# Patient Record
Sex: Female | Born: 1975 | Race: White | Hispanic: No | Smoking: Never smoker
Health system: Southern US, Community
[De-identification: ages and names within clinical notes are randomized; demographics above are authoritative.]

## PROBLEM LIST (undated history)

## (undated) DIAGNOSIS — Z800 Family history of malignant neoplasm of digestive organs: Secondary | ICD-10-CM

## (undated) DIAGNOSIS — T8859XA Other complications of anesthesia, initial encounter: Secondary | ICD-10-CM

## (undated) DIAGNOSIS — Z87442 Personal history of urinary calculi: Secondary | ICD-10-CM

## (undated) DIAGNOSIS — E039 Hypothyroidism, unspecified: Secondary | ICD-10-CM

## (undated) DIAGNOSIS — N289 Disorder of kidney and ureter, unspecified: Secondary | ICD-10-CM

## (undated) DIAGNOSIS — D649 Anemia, unspecified: Secondary | ICD-10-CM

## (undated) HISTORY — PX: CHOLECYSTECTOMY: SHX55

## (undated) HISTORY — DX: Family history of malignant neoplasm of digestive organs: Z80.0

## (undated) HISTORY — PX: GALLBLADDER SURGERY: SHX652

---

## 2011-01-15 ENCOUNTER — Emergency Department: Payer: Self-pay | Admitting: Emergency Medicine

## 2011-01-21 ENCOUNTER — Ambulatory Visit: Payer: Self-pay | Admitting: Urology

## 2011-01-22 ENCOUNTER — Ambulatory Visit: Payer: Self-pay | Admitting: Urology

## 2011-07-06 ENCOUNTER — Ambulatory Visit: Payer: Self-pay | Admitting: Urology

## 2012-02-01 ENCOUNTER — Ambulatory Visit: Payer: Self-pay | Admitting: Urology

## 2014-03-02 ENCOUNTER — Encounter: Payer: Self-pay | Admitting: Podiatry

## 2014-03-02 ENCOUNTER — Ambulatory Visit: Payer: Self-pay | Admitting: Podiatry

## 2014-03-06 ENCOUNTER — Encounter: Payer: Self-pay | Admitting: Podiatry

## 2014-03-06 ENCOUNTER — Ambulatory Visit (INDEPENDENT_AMBULATORY_CARE_PROVIDER_SITE_OTHER): Payer: BC Managed Care – PPO | Admitting: Podiatry

## 2014-03-06 VITALS — BP 133/84 | HR 87 | Resp 16 | Ht 68.0 in | Wt 170.0 lb

## 2014-03-06 DIAGNOSIS — L600 Ingrowing nail: Secondary | ICD-10-CM

## 2014-03-06 NOTE — Progress Notes (Signed)
   Subjective:    Patient ID: Kari Acosta, female    DOB: 23-Oct-1976, 38 y.o.   MRN: [id number]  HPI Comments: N pain L left great toenail D 3 weeks O sudden C worse A shoes T pt tried to cut it out, soaks in epson salt      Review of Systems  All other systems reviewed and are negative.       Objective:   Physical Exam        Assessment & Plan:

## 2014-03-06 NOTE — Patient Instructions (Signed)

## 2014-03-07 NOTE — Progress Notes (Signed)
Subjective:     Patient ID: Kari Acosta, female   DOB: [birth year], 38 y.o.   MRN: [id number]  HPI patient points to the left big toe stating it has become painful and I cannot wear shoe gear comfortably. States it's been present for about 3 weeks and she's tried to soak it and trim it without relief of symptoms   Review of Systems  All other systems reviewed and are negative.       Objective:   Physical Exam  Nursing note and vitals reviewed. Constitutional: She is oriented to person, place, and time.  Cardiovascular: Intact distal pulses.   Musculoskeletal: Normal range of motion.  Neurological: She is oriented to person, place, and time.  Skin: Skin is warm.   neurovascular status intact with muscle strength adequate and range of motion within normal limits subtalar midtarsal joint. Digits are well perfused and arch height is noted to be normal upon evaluation and I noted left hallux medial border is incurvated and painful in the distal and proximal tip. There is no redness or drainage noted     Assessment:     Ingrown toenail of the left hallux nail with damage to the underlying nail plate    Plan:     H&P reviewed with patient and condition discussed. I have recommended removal of the corner and explained procedure. I infiltrated 60 mg Xylocaine Marcaine mixture remove the medial border exposed matrix and applied phenol 3 applications 30 seconds followed by alcohol lavaged and sterile dressing. Instructed on soaks and reappoint

## 2018-09-12 ENCOUNTER — Emergency Department: Payer: BLUE CROSS/BLUE SHIELD

## 2018-09-12 ENCOUNTER — Encounter: Payer: Self-pay | Admitting: Emergency Medicine

## 2018-09-12 ENCOUNTER — Other Ambulatory Visit: Payer: Self-pay

## 2018-09-12 ENCOUNTER — Emergency Department
Admission: EM | Admit: 2018-09-12 | Discharge: 2018-09-12 | Disposition: A | Discharge status: Home / Self Care | Payer: BLUE CROSS/BLUE SHIELD | Attending: Emergency Medicine | Admitting: Emergency Medicine

## 2018-09-12 DIAGNOSIS — R51 Headache: Secondary | ICD-10-CM | POA: Diagnosis not present

## 2018-09-12 DIAGNOSIS — R519 Headache, unspecified: Secondary | ICD-10-CM

## 2018-09-12 LAB — CBC WITH DIFFERENTIAL/PLATELET
BASOS ABS: 0 10*3/uL (ref 0–0.1)
Basophils Relative: 0 %
Eosinophils Absolute: 0 10*3/uL (ref 0–0.7)
Eosinophils Relative: 1 %
HCT: 31.1 % — ABNORMAL LOW (ref 35.0–47.0)
HEMOGLOBIN: 10.5 g/dL — AB (ref 12.0–16.0)
Lymphocytes Relative: 20 %
Lymphs Abs: 0.9 10*3/uL — ABNORMAL LOW (ref 1.0–3.6)
MCH: 28.6 pg (ref 26.0–34.0)
MCHC: 33.7 g/dL (ref 32.0–36.0)
MCV: 84.8 fL (ref 80.0–100.0)
Monocytes Absolute: 0.4 10*3/uL (ref 0.2–0.9)
Monocytes Relative: 10 %
NEUTROS PCT: 69 %
Neutro Abs: 3 10*3/uL (ref 1.4–6.5)
Platelets: 142 10*3/uL — ABNORMAL LOW (ref 150–440)
RBC: 3.67 MIL/uL — AB (ref 3.80–5.20)
RDW: 15.5 % — ABNORMAL HIGH (ref 11.5–14.5)
WBC: 4.4 10*3/uL (ref 3.6–11.0)

## 2018-09-12 LAB — SEDIMENTATION RATE: Sed Rate: 34 mm/hr — ABNORMAL HIGH (ref 0–20)

## 2018-09-12 LAB — BASIC METABOLIC PANEL
ANION GAP: 7 (ref 5–15)
BUN: 11 mg/dL (ref 6–20)
CHLORIDE: 103 mmol/L (ref 98–111)
CO2: 28 mmol/L (ref 22–32)
Calcium: 8.6 mg/dL — ABNORMAL LOW (ref 8.9–10.3)
Creatinine, Ser: 0.48 mg/dL (ref 0.44–1.00)
GFR calc non Af Amer: >60 mL/min (ref >60)
Glucose, Bld: 95 mg/dL (ref 70–99)
Potassium: 3.7 mmol/L (ref 3.5–5.1)
Sodium: 138 mmol/L (ref 135–145)

## 2018-09-12 LAB — POCT PREGNANCY, URINE: PREG TEST UR: NEGATIVE

## 2018-09-12 MED ORDER — SODIUM CHLORIDE 0.9 % IV BOLUS
1000.0000 mL | Freq: Once | INTRAVENOUS | Status: AC
  Administered 2018-09-12: 1000 mL via INTRAVENOUS

## 2018-09-12 MED ORDER — PREDNISONE 10 MG (21) PO TBPK: ORAL_TABLET | ORAL | 0 refills | Status: DC

## 2018-09-12 MED ORDER — PREDNISONE 20 MG PO TABS: 60.0000 mg | ORAL_TABLET | Freq: Every day | ORAL | 1 refills | Status: AC

## 2018-09-12 MED ORDER — PROCHLORPERAZINE MALEATE 10 MG PO TABS: 10.0000 mg | ORAL_TABLET | Freq: Three times a day (TID) | ORAL | 0 refills | Status: DC | PRN

## 2018-09-12 MED ORDER — PROCHLORPERAZINE EDISYLATE 10 MG/2ML IJ SOLN
10.0000 mg | Freq: Once | INTRAMUSCULAR | Status: AC
  Administered 2018-09-12: 10 mg via INTRAVENOUS

## 2018-09-12 NOTE — ED Provider Notes (Signed)
[facility] Emergency Department Provider Note   ____________________________________________   I have reviewed the triage vital signs and the nursing notes.   HISTORY  Chief Complaint Headache   History limited by: Not Limited   HPI Kari Acosta is a 42 y.o. female who presents to the emergency department today because of concerns for headache.  It started roughly 3 days ago.  Is located on the right side and behind her right eye.  She states it is been constant.  It is severe.  She is tried ibuprofen which will only temporarily give some relief.  She has had some associated blurry vision as well as nausea.  She denies any fevers although she feels hot.  The patient states that she has not had any trauma to her head.   Per medical record review patient has a history of gallbladder surgery  History reviewed. No pertinent past medical history.  There are no active problems to display for this patient.   Past Surgical History:  Procedure Laterality Date  . CESAREAN SECTION    . GALLBLADDER SURGERY      Prior to Admission medications   Not on File    Allergies Patient has no known allergies.  No family history on file.  Social History Social History   Tobacco Use  . Smoking status: Never Smoker  . Smokeless tobacco: Never Used  Substance Use Topics  . Alcohol use: No  . Drug use: Not on file    Review of Systems Constitutional: No fever/chills Eyes: Positive for blurry vision ENT: No sore throat. Cardiovascular: Denies chest pain. Respiratory: Denies shortness of breath. Gastrointestinal: No abdominal pain.  Positive for nausea.  Genitourinary: Negative for dysuria. Musculoskeletal: Negative for back pain. Skin: Negative for rash. Neurological: Positive for headache.  ____________________________________________   PHYSICAL EXAM:  VITAL SIGNS: ED Triage Vitals  Enc Vitals Group     BP 09/12/18 1519 138/88     Pulse Rate  09/12/18 1519 92     Resp 09/12/18 1519 18     Temp 09/12/18 1519 98.1 F (36.7 C)     Temp Source 09/12/18 1519 Oral     SpO2 09/12/18 1519 100 %     Weight 09/12/18 1521 165 lb (74.8 kg)     Height 09/12/18 1521 _0  (1.727 m)     Head Circumference --      Peak Flow --      Pain Score 09/12/18 1521 6   Constitutional: Alert and oriented.  Eyes: Conjunctivae are normal.  ENT      Head: Normocephalic and atraumatic.      Nose: No congestion/rhinnorhea.      Mouth/Throat: Mucous membranes are moist.      Neck: No stridor. Hematological/Lymphatic/Immunilogical: No cervical lymphadenopathy. Cardiovascular: Normal rate, regular rhythm.  No murmurs, rubs, or gallops.  Respiratory: Normal respiratory effort without tachypnea nor retractions. Breath sounds are clear and equal bilaterally. No wheezes/rales/rhonchi. Gastrointestinal: Soft and non tender. No rebound. No guarding.  Genitourinary: Deferred Musculoskeletal: Normal range of motion in all extremities. No lower extremity edema. Neurologic:  Normal speech and language. No gross focal neurologic deficits are appreciated.  Skin:  Skin is warm, dry and intact. No rash noted. Psychiatric: Mood and affect are normal. Speech and behavior are normal. Patient exhibits appropriate insight and judgment.  ____________________________________________    LABS (pertinent positives/negatives)  Upreg negative ESR 34 BMP wnl except ca 8.6 CBC wbc 4.4, hgb 10.5, plt 142 ____________________________________________  EKG  None  ____________________________________________    RADIOLOGY  CT head No acute abnormality  ____________________________________________   PROCEDURES  Procedures  ____________________________________________   INITIAL IMPRESSION / ASSESSMENT AND PLAN / ED COURSE  Pertinent labs & imaging results that were available during my care of the patient were reviewed by me and considered in my medical  decision making (see chart for details).    Presented to the emergency department today because of concerns for headache.  Primary areas of the right temple.  Patient denies ever having headaches like this before.  Given this is a new symptom for the patient broad work-up was initiated.  Head CT did not show any mass or bleed.  Patient's blood work without any elevation of the white blood cell count.  Sedimentation rate was elevated.  At this point given the mild elevation no other obvious etiology will plan on starting steroids.  Discussed with patient importance of ophthalmology follow-up.    ____________________________________________   FINAL CLINICAL IMPRESSION(S) / ED DIAGNOSES  Final diagnoses:  Bad headache     Note: This dictation was prepared with Dragon dictation. Any transcriptional errors that result from this process are unintentional     Nance Pear, MD 09/12/18 2011

## 2018-09-12 NOTE — ED Notes (Signed)
Pt states severe headache right behind her left eye. Pt states she had tried several different medications with no relief. Pt denies any headaches in the past

## 2018-09-12 NOTE — ED Notes (Signed)
Urine sent to lab with temp label on it.

## 2018-09-12 NOTE — Discharge Instructions (Signed)
Please seek medical attention for any high fevers, chest pain, shortness of breath, change in behavior, persistent vomiting, bloody stool or any other new or concerning symptoms.  

## 2018-09-12 NOTE — ED Notes (Signed)
Patient transported to CT 

## 2018-09-12 NOTE — ED Triage Notes (Signed)
Pt arrived via POV with reports of right sided headache around temple area since Wednesday off and on.  Pt states she has been taking ibuprofen and excedrin migraine. Pt states when she has headaches she gets nauseated and has light sensitivity.

## 2019-12-20 DIAGNOSIS — R519 Headache, unspecified: Secondary | ICD-10-CM | POA: Diagnosis not present

## 2019-12-20 DIAGNOSIS — J029 Acute pharyngitis, unspecified: Secondary | ICD-10-CM | POA: Diagnosis not present

## 2019-12-20 DIAGNOSIS — Z20828 Contact with and (suspected) exposure to other viral communicable diseases: Secondary | ICD-10-CM | POA: Diagnosis not present

## 2019-12-20 DIAGNOSIS — R509 Fever, unspecified: Secondary | ICD-10-CM | POA: Diagnosis not present

## 2019-12-21 DIAGNOSIS — R0982 Postnasal drip: Secondary | ICD-10-CM | POA: Diagnosis not present

## 2019-12-21 DIAGNOSIS — J019 Acute sinusitis, unspecified: Secondary | ICD-10-CM | POA: Diagnosis not present

## 2020-09-10 ENCOUNTER — Ambulatory Visit
Admission: RE | Admit: 2020-09-10 | Discharge: 2020-09-10 | Disposition: A | Discharge status: Home / Self Care | Payer: BC Managed Care – PPO | Source: Ambulatory Visit | Attending: Family Medicine | Admitting: Family Medicine

## 2020-09-10 ENCOUNTER — Other Ambulatory Visit: Payer: Self-pay

## 2020-09-10 VITALS — BP 127/84 | HR 107 | Temp 98.9°F | Resp 18

## 2020-09-10 DIAGNOSIS — M542 Cervicalgia: Secondary | ICD-10-CM

## 2020-09-10 DIAGNOSIS — M62838 Other muscle spasm: Secondary | ICD-10-CM

## 2020-09-10 DIAGNOSIS — M25511 Pain in right shoulder: Secondary | ICD-10-CM | POA: Diagnosis not present

## 2020-09-10 DIAGNOSIS — S161XXA Strain of muscle, fascia and tendon at neck level, initial encounter: Secondary | ICD-10-CM

## 2020-09-10 HISTORY — DX: Disorder of kidney and ureter, unspecified: N28.9

## 2020-09-10 MED ORDER — IBUPROFEN 800 MG PO TABS: 800.0000 mg | ORAL_TABLET | Freq: Three times a day (TID) | ORAL | 0 refills | Status: DC | PRN

## 2020-09-10 MED ORDER — KETOROLAC TROMETHAMINE 30 MG/ML IJ SOLN
30.0000 mg | Freq: Once | INTRAMUSCULAR | Status: AC
  Administered 2020-09-10: 30 mg via INTRAMUSCULAR

## 2020-09-10 MED ORDER — DEXAMETHASONE SODIUM PHOSPHATE 10 MG/ML IJ SOLN
10.0000 mg | Freq: Once | INTRAMUSCULAR | Status: AC
  Administered 2020-09-10: 10 mg via INTRAMUSCULAR

## 2020-09-10 MED ORDER — PREDNISONE 10 MG (21) PO TBPK: ORAL_TABLET | Freq: Every day | ORAL | 0 refills | Status: AC

## 2020-09-10 MED ORDER — CYCLOBENZAPRINE HCL 10 MG PO TABS: 10.0000 mg | ORAL_TABLET | Freq: Two times a day (BID) | ORAL | 0 refills | Status: DC | PRN

## 2020-09-10 NOTE — ED Triage Notes (Signed)
Pt starting having upper R back/neck pain 3 days ago.  Has progressed to upper back, neck and R arm pain.  Periods of numbness in RUE to fingers.  No heavy lifting or known injury.  Pt tried to work yesterday with increased pain.  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    Pain and tightness stays around a 5 with episodes of spasming that increase it to 9.    Ibuprofen, heating pad and biofreeze patches tried at home.  Heating pad at night has helped some.  Last ibuprofen 600mg  at 0900.

## 2020-09-10 NOTE — ED Provider Notes (Signed)
[facility]   [id number] 09/10/20 Arrival Time: 3810  FB:[facility]  SUBJECTIVE: History from: patient. Kari Acosta is a 44 y.o. female complains of right posterior neck and shoulder pain that began about 3 days ago. Has used ibuprofen with little relief. Reports that she has intermittent periods of numbness and tingling to right upper extremity and her fingers. Patient try to go to work yesterday and had increased pain. 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 Has taken ibuprofen this morning about 9:00. Has tried heat and Biofreeze patches at home. Symptoms are made worse with activity.  Denies similar symptoms in the past.  Denies fever, chills, erythema, ecchymosis, effusion, weakness, saddle paresthesias, loss of bowel or bladder function.      ROS: As per HPI.  All other pertinent ROS negative.     Past Medical History:  Diagnosis Date  . Renal disorder    kidney stones   Past Surgical History:  Procedure Laterality Date  . CESAREAN SECTION    . GALLBLADDER SURGERY     No Known Allergies No current facility-administered medications on file prior to encounter.   Current Outpatient Medications on File Prior to Encounter  Medication Sig Dispense Refill  . [DISCONTINUED] prochlorperazine (COMPAZINE) 10 MG tablet Take 1 tablet (10 mg total) by mouth every 8 (eight) hours as needed (headache). 30 tablet 0   Social History   Socioeconomic History  . Marital status: Single    Spouse name: Not on file  . Number of children: Not on file  . Years of education: Not on file  . Highest education level: Not on file  Occupational History  . Not on file  Tobacco Use  . Smoking status: Never Smoker  . Smokeless tobacco: Never Used  Vaping Use  . Vaping Use: Never used  Substance and Sexual Activity  . Alcohol use: No  . Drug use: Not on file  . Sexual activity: Not on file  Other Topics Concern  . Not on  file  Social History Narrative  . Not on file   Social Determinants of Health   Financial Resource Strain:   . Difficulty of Paying Living Expenses: Not on file  Food Insecurity:   . Worried About Charity fundraiser in the Last Year: Not on file  . Ran Out of Food in the Last Year: Not on file  Transportation Needs:   . Lack of Transportation (Medical): Not on file  . Lack of Transportation (Non-Medical): Not on file  Physical Activity:   . Days of Exercise per Week: Not on file  . Minutes of Exercise per Session: Not on file  Stress:   . Feeling of Stress : Not on file  Social Connections:   . Frequency of Communication with Friends and Family: Not on file  . Frequency of Social Gatherings with Friends and Family: Not on file  . Attends Religious Services: Not on file  . Active Member of Clubs or Organizations: Not on file  . Attends Archivist Meetings: Not on file  . Marital Status: Not on file  Intimate Partner Violence:   . Fear of Current or Ex-Partner: Not on file  . Emotionally Abused: Not on file  . Physically Abused: Not on file  . Sexually Abused: Not on file   No family history on file.  OBJECTIVE:  Vitals:   09/10/20 1348  BP: 127/84  Pulse: (!) 107  Resp: 18  Temp: 98.9 F (37.2 C)  TempSrc: Oral  SpO2: 98%    General appearance: ALERT; in no acute distress.  Head: NCAT Lungs: Normal respiratory effort CV:  pulses 2+ bilaterally. Cap refill < 2 seconds Musculoskeletal:  Inspection: Skin warm, dry, clear and intact without obvious erythema, effusion, or ecchymosis.  Palpation: Right paraspinal and right posterior cervical muscles tender to palpation and in spasm ROM: Limited ROM active and passive to right arm and neck Skin: warm and dry Neurologic: Ambulates without difficulty; Sensation intact about the upper/ lower extremities Psychological: alert and cooperative; normal mood and affect  DIAGNOSTIC STUDIES:  No results found.    ASSESSMENT & PLAN:  1. Acute strain of neck muscle, initial encounter   2. Neck pain   3. Acute pain of right shoulder   4. Cervical paraspinal muscle spasm     Meds ordered this encounter  Medications  . ketorolac (TORADOL) 30 MG/ML injection 30 mg  . dexamethasone (DECADRON) injection 10 mg  . predniSONE (STERAPRED UNI-PAK 21 TAB) 10 MG (21) TBPK tablet    Sig: Take by mouth daily for 6 days. Take 6 tablets on day 1, 5 tablets on day 2, 4 tablets on day 3, 3 tablets on day 4, 2 tablets on day 5, 1 tablet on day 6    Dispense:  21 tablet    Refill:  0    Order Specific Question:   Supervising Provider    Answer:   Chase Picket [id number]  . cyclobenzaprine (FLEXERIL) 10 MG tablet    Sig: Take 1 tablet (10 mg total) by mouth 2 (two) times daily as needed for muscle spasms.    Dispense:  20 tablet    Refill:  0    Order Specific Question:   Supervising Provider    Answer:   Chase Picket [id number]  . ibuprofen (ADVIL) 800 MG tablet    Sig: Take 1 tablet (800 mg total) by mouth every 8 (eight) hours as needed for moderate pain.    Dispense:  21 tablet    Refill:  0    Order Specific Question:   Supervising Provider    Answer:   Chase Picket [[id number]]   Toradol 30 mg IM given in office today Decadron 10 mg IM given in office today Prescribe steroid taper Prescribed cyclobenzaprine Prescribed ibuprofen cContinue conservative management of rest, ice, and gentle stretches Take ibuprofen as needed for pain relief (may cause abdominal discomfort, ulcers, and GI bleeds avoid taking with other NSAIDs) Take cyclobenzaprine at nighttime for symptomatic relief. Avoid driving or operating heavy machinery while using medication. Follow up with PCP if symptoms persist Return or go to the ER if you have any new or worsening symptoms (fever, chills, chest pain, abdominal pain, changes in bowel or bladder habits, pain radiating into lower legs)    Reviewed expectations re:  course of current medical issues. Questions answered. Outlined signs and symptoms indicating need for more acute intervention. Patient verbalized understanding. After Visit Summary given.       Faustino Congress, NP 09/10/20 1402

## 2020-09-10 NOTE — Discharge Instructions (Signed)
You have received an injection of Toradol for pain in the office today  You have also received an injection of steroids to help with inflammation around your neck  I have sent in ibuprofen 800 mg for you to take 1 tablet every 8 hours. You may take at 1000 mg of Tylenol with this as well.  I have sent in a prednisone taper for you to take for 6 days. 6 tablets on day one, 5 tablets on day two, 4 tablets on day three, 3 tablets on day four, 2 tablets on day five, and 1 tablet on day six.  I have sent in cyclobenzaprine for you to take twice a day as needed for muscle spasms. This medication can make you sleepy. Do not drive a car or operate machinery while taking this medication.  You may look up Mikki Santee and [place] on YouTube, they will have stretches for cervical muscle spasms, or cervical radiculopathy

## 2020-09-13 ENCOUNTER — Ambulatory Visit (INDEPENDENT_AMBULATORY_CARE_PROVIDER_SITE_OTHER): Payer: BC Managed Care – PPO

## 2020-09-13 ENCOUNTER — Ambulatory Visit
Admission: RE | Admit: 2020-09-13 | Discharge: 2020-09-13 | Disposition: A | Discharge status: Home / Self Care | Payer: BC Managed Care – PPO | Source: Ambulatory Visit | Attending: Emergency Medicine | Admitting: Emergency Medicine

## 2020-09-13 ENCOUNTER — Other Ambulatory Visit: Payer: Self-pay

## 2020-09-13 VITALS — BP 131/98 | HR 77 | Temp 98.1°F | Resp 18 | Ht 68.0 in | Wt 168.0 lb

## 2020-09-13 DIAGNOSIS — M25511 Pain in right shoulder: Secondary | ICD-10-CM | POA: Diagnosis not present

## 2020-09-13 DIAGNOSIS — M5030 Other cervical disc degeneration, unspecified cervical region: Secondary | ICD-10-CM | POA: Diagnosis not present

## 2020-09-13 DIAGNOSIS — M542 Cervicalgia: Secondary | ICD-10-CM

## 2020-09-13 DIAGNOSIS — M62838 Other muscle spasm: Secondary | ICD-10-CM | POA: Diagnosis not present

## 2020-09-13 MED ORDER — TIZANIDINE HCL 4 MG PO TABS: 4.0000 mg | ORAL_TABLET | Freq: Three times a day (TID) | ORAL | 0 refills | Status: DC | PRN

## 2020-09-13 MED ORDER — ACETAMINOPHEN 500 MG PO TABS
1000.0000 mg | ORAL_TABLET | Freq: Once | ORAL | Status: AC
  Administered 2020-09-13: 1000 mg via ORAL

## 2020-09-13 MED ORDER — LIDOCAINE 5 % EX PTCH: 1.0000 | MEDICATED_PATCH | CUTANEOUS | 0 refills | Status: DC

## 2020-09-13 MED ORDER — KETOROLAC TROMETHAMINE 60 MG/2ML IM SOLN
30.0000 mg | Freq: Once | INTRAMUSCULAR | Status: AC
  Administered 2020-09-13: 30 mg via INTRAMUSCULAR

## 2020-09-13 NOTE — ED Triage Notes (Signed)
Patient in today w/ right shoulder pain. Patient states NKI. Sx onset was approx 6 days ago.

## 2020-09-13 NOTE — ED Provider Notes (Signed)
HPI  SUBJECTIVE:  Kari Acosta is a left-handed 44 y.o. female who presents with 5 days of right upper shoulder pain.  She states it has gone into her neck starting yesterday.  She reports numbness down her arm underneath the axilla and along the medial arm to her ring and little fingers.  States the arm is constant sharp stabbing pain in the neck is a constant cramping-like pain.  She reports some grip weakness secondary to the pain.  No trauma to the neck or shoulder.  No fevers, chest pain, rash.  No sore throat.  She does a lot of repetitive activity at work, but no overhead repetitive activity.  Patient seen at the [facility] urgent care 2 days ago for the same, thought to have an acute neck muscle strain with spasm,.  She was given Decadron and Toradol there.  Sent home with prednisone, Flexeril, ibuprofen.  She states none of this is working.  She has also tried heat.  Symptoms are worse with sitting, lying, straightening her head up.  She has never had symptoms like this before.  Past medical history negative for diabetes, hypertension, cancer, osteoporosis, IV drug use, degenerative disc disease.  LMP: 3 days ago.  Denies the possibility being pregnant.  PMD: None.   Past Medical History:  Diagnosis Date  . Renal disorder    kidney stones    Past Surgical History:  Procedure Laterality Date  . CESAREAN SECTION    . GALLBLADDER SURGERY      History reviewed. No pertinent family history.  Social History   Tobacco Use  . Smoking status: Never Smoker  . Smokeless tobacco: Never Used  Vaping Use  . Vaping Use: Never used  Substance Use Topics  . Alcohol use: No  . Drug use: Not on file     Current Facility-Administered Medications:  .  acetaminophen (TYLENOL) tablet 1,000 mg, 1,000 mg, Oral, Once, Melynda Ripple, MD .  ketorolac (TORADOL) injection 30 mg, 30 mg, Intramuscular, Once, Melynda Ripple, MD  Current Outpatient Medications:  .  ibuprofen (ADVIL) 800 MG tablet,  Take 1 tablet (800 mg total) by mouth every 8 (eight) hours as needed for moderate pain., Disp: 21 tablet, Rfl: 0 .  predniSONE (STERAPRED UNI-PAK 21 TAB) 10 MG (21) TBPK tablet, Take by mouth daily for 6 days. Take 6 tablets on day 1, 5 tablets on day 2, 4 tablets on day 3, 3 tablets on day 4, 2 tablets on day 5, 1 tablet on day 6, Disp: 21 tablet, Rfl: 0 .  lidocaine (LIDODERM) 5 %, Place 1 patch onto the skin daily. Remove & Discard patch within 12 hours or as directed by MD, Disp: 10 patch, Rfl: 0 .  tiZANidine (ZANAFLEX) 4 MG tablet, Take 1 tablet (4 mg total) by mouth every 8 (eight) hours as needed for muscle spasms., Disp: 30 tablet, Rfl: 0  No Known Allergies   ROS  As noted in HPI.   Physical Exam  BP (!) 131/98 (BP Location: Left Arm)   Pulse 77   Temp 98.1 F (36.7 C) (Oral)   Resp 18   Ht 5\' 8"  (1.727 m)   Wt 76.2 kg   LMP 09/04/2020 (Exact Date)   SpO2 100%   BMI 25.54 kg/m   Constitutional: Well developed, well nourished, appears uncomfortable Eyes:  EOMI, conjunctiva normal bilaterally HENT: Normocephalic, atraumatic,mucus membranes moist Neck: No cervical adenopathy Respiratory: Normal inspiratory effort Cardiovascular: Normal rate GI: nondistended skin: No rash, skin intact  Musculoskeletal: Positive tenderness, muscle spasm right trapezius.  Complete shoulder exam normal.  Patient neurovascularly intact to light touch, temperature and motor intact in the median/radial/ulnar distribution.  No numbness over the deltoid.  Grip 5/5 and equal bilaterally.  RP 2+.  Pain aggravated with neck flexion and rotating her head to the right.  Empty can and liftoff test painful.  No C-spine tenderness. Neurologic: Alert & oriented x 3, no focal neuro deficits Psychiatric: Speech and behavior appropriate   ED Course   Medications  acetaminophen (TYLENOL) tablet 1,000 mg (has no administration in time range)  ketorolac (TORADOL) injection 30 mg (has no administration in  time range)    Orders Placed This Encounter  Procedures  . DG Cervical Spine Complete    Standing Status:   Standing    Number of Occurrences:   1    Order Specific Question:   Reason for Exam (SYMPTOM  OR DIAGNOSIS REQUIRED)    Answer:   r neck pain with radiculopathy    Order Specific Question:   Is patient pregnant?    Answer:   No    No results found for this or any previous visit (from the past 24 hour(s)). DG Cervical Spine Complete  Result Date: 09/13/2020 CLINICAL DATA:  RIGHT shoulder pain, RIGHT neck pain with radiculopathy for 6 weeks EXAM: CERVICAL SPINE - COMPLETE 4+ VIEW COMPARISON:  None FINDINGS: Slight reversal of cervical lordosis question muscle spasm. Osseous mineralization normal. Disc space narrowing C6-C7. Vertebral body and remaining disc space heights maintained. No fracture, subluxation, or bone destruction. Bony foramina patent. Prevertebral soft tissues normal thickness. Lung apices clear. IMPRESSION: Mild degenerative disc disease changes C6-C7. Question muscle spasm. Otherwise negative exam. Electronically Signed   By: Lavonia Dana M.D.   On: 09/13/2020 13:53    ED Clinical Impression  1. Trapezius muscle spasm   2. Degenerative disc disease, cervical      ED Assessment/Plan  Outside records reviewed.  As noted in HPI.  We will get C-spine imaging because this is a second visit and she is also describing some radicular pain to evaluate for arthritis or degenerative disc disease.  Doubt discitis, osteomyelitis or some infectious cause of her symptoms.  This seems to be very musculoskeletal.  Giving 30 mg of Toradol IM, 1000 milligrams of Tylenol p.o.  Reviewed imaging independently.  Mild degenerative disc changes/disc space narrowing C6/C7.  Question muscle spasm.  See radiology report for full details.  Patient with musculoskeletal neck/trapezius pain with spasm.  We will have her discontinue Flexeril, start Zanaflex.  Continue ibuprofen 800 mg  combined with 1000 mg of Tylenol 3 times a day.  Finish the prednisone, lidocaine patches, follow-up with [facility] in a week.  Discussed imaging, MDM, treatment plan, and plan for follow-up with patient. Discussed sn/sx that should prompt return to the ED. patient agrees with plan.   Meds ordered this encounter  Medications  . acetaminophen (TYLENOL) tablet 1,000 mg  . ketorolac (TORADOL) injection 30 mg  . tiZANidine (ZANAFLEX) 4 MG tablet    Sig: Take 1 tablet (4 mg total) by mouth every 8 (eight) hours as needed for muscle spasms.    Dispense:  30 tablet    Refill:  0  . lidocaine (LIDODERM) 5 %    Sig: Place 1 patch onto the skin daily. Remove & Discard patch within 12 hours or as directed by MD    Dispense:  10 patch    Refill:  0    *  This clinic note was created using [employer]. Therefore, there may be occasional mistakes despite careful proofreading.   ?    Melynda Ripple, MD 09/13/20 [phone]

## 2020-09-13 NOTE — Discharge Instructions (Addendum)
You have very mild degenerative disc disease in C6/C7.  I believe this was the cause of the numbness in your arm.  Otherwise, your x-rays are normal.  Stop the Flexeril.  Try the Zanaflex instead. Continue ibuprofen 800 mg combined with 1000 mg of Tylenol 3 times a day.  Finish the prednisone, lidocaine patches, follow-up with [facility] in a week.

## 2020-09-17 DIAGNOSIS — M5412 Radiculopathy, cervical region: Secondary | ICD-10-CM | POA: Diagnosis not present

## 2020-09-28 DIAGNOSIS — M5412 Radiculopathy, cervical region: Secondary | ICD-10-CM | POA: Diagnosis not present

## 2020-10-01 DIAGNOSIS — M542 Cervicalgia: Secondary | ICD-10-CM | POA: Diagnosis not present

## 2020-10-01 DIAGNOSIS — M5412 Radiculopathy, cervical region: Secondary | ICD-10-CM | POA: Diagnosis not present

## 2020-10-02 DIAGNOSIS — M5412 Radiculopathy, cervical region: Secondary | ICD-10-CM | POA: Diagnosis not present

## 2020-10-09 DIAGNOSIS — M5412 Radiculopathy, cervical region: Secondary | ICD-10-CM | POA: Diagnosis not present

## 2020-12-23 DIAGNOSIS — Z20822 Contact with and (suspected) exposure to covid-19: Secondary | ICD-10-CM | POA: Diagnosis not present

## 2020-12-23 DIAGNOSIS — Z03818 Encounter for observation for suspected exposure to other biological agents ruled out: Secondary | ICD-10-CM | POA: Diagnosis not present

## 2020-12-26 DIAGNOSIS — J0100 Acute maxillary sinusitis, unspecified: Secondary | ICD-10-CM | POA: Diagnosis not present

## 2020-12-26 DIAGNOSIS — R0982 Postnasal drip: Secondary | ICD-10-CM | POA: Diagnosis not present

## 2021-06-19 ENCOUNTER — Ambulatory Visit: Payer: BC Managed Care – PPO | Admitting: Nurse Practitioner

## 2021-06-19 ENCOUNTER — Encounter: Payer: Self-pay | Admitting: Nurse Practitioner

## 2021-06-19 ENCOUNTER — Other Ambulatory Visit: Payer: Self-pay

## 2021-06-19 VITALS — BP 130/91 | HR 82 | Temp 99.2°F | Ht 68.0 in | Wt 167.2 lb

## 2021-06-19 DIAGNOSIS — K5900 Constipation, unspecified: Secondary | ICD-10-CM | POA: Insufficient documentation

## 2021-06-19 DIAGNOSIS — Z13220 Encounter for screening for lipoid disorders: Secondary | ICD-10-CM | POA: Diagnosis not present

## 2021-06-19 DIAGNOSIS — Z1231 Encounter for screening mammogram for malignant neoplasm of breast: Secondary | ICD-10-CM

## 2021-06-19 DIAGNOSIS — Z7689 Persons encountering health services in other specified circumstances: Secondary | ICD-10-CM

## 2021-06-19 DIAGNOSIS — Z0000 Encounter for general adult medical examination without abnormal findings: Secondary | ICD-10-CM | POA: Diagnosis not present

## 2021-06-19 DIAGNOSIS — Z23 Encounter for immunization: Secondary | ICD-10-CM

## 2021-06-19 DIAGNOSIS — Z1211 Encounter for screening for malignant neoplasm of colon: Secondary | ICD-10-CM

## 2021-06-19 DIAGNOSIS — Z800 Family history of malignant neoplasm of digestive organs: Secondary | ICD-10-CM | POA: Insufficient documentation

## 2021-06-19 DIAGNOSIS — E039 Hypothyroidism, unspecified: Secondary | ICD-10-CM | POA: Diagnosis not present

## 2021-06-19 DIAGNOSIS — Z1159 Encounter for screening for other viral diseases: Secondary | ICD-10-CM | POA: Diagnosis not present

## 2021-06-19 DIAGNOSIS — Z114 Encounter for screening for human immunodeficiency virus [HIV]: Secondary | ICD-10-CM

## 2021-06-19 DIAGNOSIS — I83812 Varicose veins of left lower extremities with pain: Secondary | ICD-10-CM

## 2021-06-19 DIAGNOSIS — Z136 Encounter for screening for cardiovascular disorders: Secondary | ICD-10-CM

## 2021-06-19 NOTE — Assessment & Plan Note (Signed)
Significant varicose veins noted to left lower and upper leg. With intermittent pain will refer her to vascular for treatment options. She can wear compression socks.

## 2021-06-19 NOTE — Assessment & Plan Note (Signed)
States her mother has stage 3 colorectal cancer and that it is hereditary. Will place referral for screening colonoscopy.

## 2021-06-19 NOTE — Progress Notes (Signed)
New Patient Office Visit  Subjective:  Patient ID: Kari Acosta, female    DOB: [birth year]  Age: 45 y.o. MRN: [id number]  CC:  Chief Complaint  Patient presents with   Establish Care    Patient states she is here to establish care and discuss having a GI referral for a colonoscopy as her mother has stage 3 colon cancer and it is hereditary and her mother was diagnosed in 2016. Patient states she has issues with constipation. Patient would also like to discuss having a mammogram.     HPI Kari Acosta presents for new patient visit to establish care.  Introduced to Designer, jewellery role and practice setting.  All questions answered.  Discussed provider/patient relationship and expectations  CONSTIPATION  She has had trouble with constipation for years. She states that she does not have a bowel movement every day and that it takes 4 [employer] brand laxatives to have a bowel movement. She does this every other day. She cannot have a bowel movement now without taking laxatives. She denies blood in her stool. She does not take any other medications. She also states that she had "thyroid problems" in the past. She does not remember if it was underactive or overactive, however she was on medicine at one point.    Past Medical History:  Diagnosis Date   Renal disorder    kidney stones    Past Surgical History:  Procedure Laterality Date   CESAREAN SECTION     CHOLECYSTECTOMY     GALLBLADDER SURGERY      Family History  Problem Relation Age of Onset   Colon cancer Mother    Hypertension Mother    Hyperlipidemia Father    Hypertension Father    Stroke Father    Healthy Son    Healthy Son    Cancer Maternal Aunt    Breast cancer Maternal Grandmother    Gallbladder disease Maternal Grandmother        Gallbladder Cancer   Lung cancer Maternal Grandmother    Hypertension Maternal Grandfather    Heart failure Paternal Grandmother    Tuberculosis Paternal Grandfather     Cancer - Other Maternal Great-grandmother        Gallbladder Cancer    Social History   Socioeconomic History   Marital status: Single    Spouse name: Not on file   Number of children: Not on file   Years of education: Not on file   Highest education level: Not on file  Occupational History   Not on file  Tobacco Use   Smoking status: Never   Smokeless tobacco: Never  Vaping Use   Vaping Use: Never used  Substance and Sexual Activity   Alcohol use: No   Drug use: Never   Sexual activity: Yes  Other Topics Concern   Not on file  Social History Narrative   Not on file   Social Determinants of Health   Financial Resource Strain: Not on file  Food Insecurity: Not on file  Transportation Needs: Not on file  Physical Activity: Not on file  Stress: Not on file  Social Connections: Not on file  Intimate Partner Violence: Not on file    ROS Review of Systems  Constitutional:  Positive for fatigue. Negative for fever and unexpected weight change.  HENT: Negative.    Eyes: Negative.   Respiratory: Negative.    Cardiovascular: Negative.   Gastrointestinal:  Positive for constipation. Negative for abdominal pain, diarrhea, nausea and  vomiting.  Endocrine: Positive for cold intolerance. Negative for polydipsia, polyphagia and polyuria.  Genitourinary: Negative.   Musculoskeletal: Negative.   Skin: Negative.   Neurological: Negative.   Hematological: Negative.   Psychiatric/Behavioral: Negative.     Objective:   Today's Vitals: BP (!) 130/91   Pulse 82   Temp 99.2 F (37.3 C) (Oral)   Ht 5\' 8"  (1.727 m)   Wt 167 lb 3.2 oz (75.8 kg)   LMP 05/26/2021 (Approximate)   SpO2 99%   BMI 25.42 kg/m   Physical Exam Vitals and nursing note reviewed.  Constitutional:      General: She is not in acute distress.    Appearance: Normal appearance.  HENT:     Head: Normocephalic and atraumatic.     Right Ear: Tympanic membrane, ear canal and external ear normal.     Left  Ear: Tympanic membrane, ear canal and external ear normal.     Nose: Nose normal.     Mouth/Throat:     Mouth: Mucous membranes are moist.     Pharynx: Oropharynx is clear.  Eyes:     Conjunctiva/sclera: Conjunctivae normal.  Cardiovascular:     Rate and Rhythm: Normal rate and regular rhythm.     Pulses: Normal pulses.     Heart sounds: Normal heart sounds.  Pulmonary:     Effort: Pulmonary effort is normal.     Breath sounds: Normal breath sounds.  Abdominal:     General: Bowel sounds are normal.     Palpations: Abdomen is soft.     Tenderness: There is no abdominal tenderness.  Musculoskeletal:        General: Normal range of motion.     Cervical back: Normal range of motion.  Skin:    General: Skin is warm and dry.     Comments: Numerous varicose veins to left lower extremity  Neurological:     General: No focal deficit present.     Mental Status: She is alert and oriented to person, place, and time.     Cranial Nerves: No cranial nerve deficit.     Coordination: Coordination normal.     Gait: Gait normal.  Psychiatric:        Mood and Affect: Mood normal.        Behavior: Behavior normal.        Thought Content: Thought content normal.        Judgment: Judgment normal.    Assessment & Plan:   Problem List Items Addressed This Visit       Cardiovascular and Mediastinum   Varicose veins of left lower extremity with pain    Significant varicose veins noted to left lower and upper leg. With intermittent pain will refer her to vascular for treatment options. She can wear compression socks.        Relevant Orders   Ambulatory referral to Vascular Surgery     Other   Constipation    Chronic, used laxatives regularly and now can't have bowel movement without them. Will refer to GI along with family history of hereditary colon cancer.        Relevant Orders   Ambulatory referral to Gastroenterology   Family history of colon cancer in mother    States her mother  has stage 3 colorectal cancer and that it is hereditary. Will place referral for screening colonoscopy.        Relevant Orders   Ambulatory referral to Gastroenterology   Other Visit Diagnoses  Routine general medical examination at a health care facility    -  Primary   Health maintenance reviewed. She wants to come back for pap. Check cmp, cbc, tsh. family history of colon/rectal, and breast cancer. Will order colonscopy/mammo   Relevant Orders   Comprehensive metabolic panel   CBC with Differential/Platelet   Thyroid Panel With TSH   Screening for HIV (human immunodeficiency virus)       screen for HIV today   Relevant Orders   HIV Antibody (routine testing w rflx)   Encounter for hepatitis C screening test for low risk patient       screen for hep C today   Relevant Orders   Hepatitis C antibody   Encounter for lipid screening for cardiovascular disease       Lipid panel ordered today   Relevant Orders   Lipid Panel w/o Chol/HDL Ratio   Encounter for screening mammogram for malignant neoplasm of breast       With family history of breast cancer, will order baseline mammogram today.    Relevant Orders   MM 3D SCREEN BREAST BILATERAL   Screening for colon cancer       With colorectal cancer in her mother, her oncologist said it was hereditary and recommended early screening. Colonscopy ordered today   Relevant Orders   Ambulatory referral to Gastroenterology   Encounter to establish care       Need for Tdap vaccination       TdaP given today   Relevant Orders   Tdap vaccine greater than or equal to 7yo IM (Completed)       Outpatient Encounter Medications as of 06/19/2021  Medication Sig   ibuprofen (ADVIL) 800 MG tablet Take 1 tablet (800 mg total) by mouth every 8 (eight) hours as needed for moderate pain. (Patient not taking: Reported on 06/19/2021)   lidocaine (LIDODERM) 5 % Place 1 patch onto the skin daily. Remove & Discard patch within 12 hours or as directed by  MD (Patient not taking: Reported on 06/19/2021)   tiZANidine (ZANAFLEX) 4 MG tablet Take 1 tablet (4 mg total) by mouth every 8 (eight) hours as needed for muscle spasms. (Patient not taking: Reported on 06/19/2021)   [DISCONTINUED] prochlorperazine (COMPAZINE) 10 MG tablet Take 1 tablet (10 mg total) by mouth every 8 (eight) hours as needed (headache).   No facility-administered encounter medications on file as of 06/19/2021.    LABORATORY TESTING:  - Pap smear: offered today, will schedule in future  IMMUNIZATIONS:   - Tdap: Tetanus vaccination status reviewed: Td vaccination indicated and given today. - Influenza: Postponed to flu season - Pneumovax: Not applicable - Prevnar: Not applicable - HPV: Not applicable - Zostavax vaccine: Not applicable  SCREENING: -Mammogram: Ordered today  - Colonoscopy: Ordered today  - Bone Density: Not applicable  -Hearing Test: Not applicable  -Spirometry: Not applicable   PATIENT COUNSELING:   Advised to take 1 mg of folate supplement per day if capable of pregnancy.   Sexuality: Discussed sexually transmitted diseases, partner selection, use of condoms, avoidance of unintended pregnancy  and contraceptive alternatives.   Advised to avoid cigarette smoking.  I discussed with the patient that most people either abstain from alcohol or drink within safe limits (<=14/week and <=4 drinks/occasion for males, <=7/weeks and <= 3 drinks/occasion for females) and that the risk for alcohol disorders and other health effects rises proportionally with the number of drinks per week and how often a drinker exceeds daily  limits.  Discussed cessation/primary prevention of drug use and availability of treatment for abuse.   Diet: Encouraged to adjust caloric intake to maintain  or achieve ideal body weight, to reduce intake of dietary saturated fat and total fat, to limit sodium intake by avoiding high sodium foods and not adding table salt, and to maintain adequate  dietary potassium and calcium preferably from fresh fruits, vegetables, and low-fat dairy products.    stressed the importance of regular exercise  Injury prevention: Discussed safety belts, safety helmets, smoke detector, smoking near bedding or upholstery.   Dental health: Discussed importance of regular tooth brushing, flossing, and dental visits.   Follow-up: Return if symptoms worsen or fail to improve, for pap.   Charyl Dancer, NP

## 2021-06-19 NOTE — Assessment & Plan Note (Signed)
Chronic, used laxatives regularly and now can't have bowel movement without them. Will refer to GI along with family history of hereditary colon cancer.

## 2021-06-19 NOTE — Patient Instructions (Addendum)
It was great to meet you today! Your labs should result tomorrow and I will respond via mychart to the results. I have placed a referral to GI for colonoscopy and follow up on constipation, referral to vascular for varicose veins, and the order for mammogram. You can call this number to set up the mammogram:   [facility] [place]: [address], [place], [place] [address]  Phone: [phone]  You should hear from the GI office and Vascular office to set up appointments. Please let me know if you don't hear from them in the next 2 weeks. You can schedule an appointment for a Pap Smear when your schedule allows.   Again, it was great to meet you and thank you for allowing me to participate in your care.  Ander Purpura, NP

## 2021-06-20 LAB — CBC WITH DIFFERENTIAL/PLATELET
Basophils Absolute: 0.1 10*3/uL (ref 0.0–0.2)
Basos: 1 %
EOS (ABSOLUTE): 0.1 10*3/uL (ref 0.0–0.4)
Eos: 2 %
Hematocrit: 33.7 % — ABNORMAL LOW (ref 34.0–46.6)
Hemoglobin: 10.8 g/dL — ABNORMAL LOW (ref 11.1–15.9)
Immature Grans (Abs): 0 10*3/uL (ref 0.0–0.1)
Immature Granulocytes: 0 %
Lymphocytes Absolute: 1.6 10*3/uL (ref 0.7–3.1)
Lymphs: 32 %
MCH: 26.4 pg — ABNORMAL LOW (ref 26.6–33.0)
MCHC: 32 g/dL (ref 31.5–35.7)
MCV: 82 fL (ref 79–97)
Monocytes Absolute: 0.5 10*3/uL (ref 0.1–0.9)
Monocytes: 10 %
Neutrophils Absolute: 2.7 10*3/uL (ref 1.4–7.0)
Neutrophils: 55 %
Platelets: 162 10*3/uL (ref 150–450)
RBC: 4.09 x10E6/uL (ref 3.77–5.28)
RDW: 14 % (ref 11.7–15.4)
WBC: 5 10*3/uL (ref 3.4–10.8)

## 2021-06-20 LAB — COMPREHENSIVE METABOLIC PANEL
ALT: 11 IU/L (ref 0–32)
AST: 17 IU/L (ref 0–40)
Albumin/Globulin Ratio: 1.9 (ref 1.2–2.2)
Albumin: 4.6 g/dL (ref 3.8–4.8)
Alkaline Phosphatase: 40 IU/L — ABNORMAL LOW (ref 44–121)
BUN/Creatinine Ratio: 12 (ref 9–23)
BUN: 9 mg/dL (ref 6–24)
Bilirubin Total: 0.5 mg/dL (ref 0.0–1.2)
CO2: 23 mmol/L (ref 20–29)
Calcium: 9.3 mg/dL (ref 8.7–10.2)
Chloride: 101 mmol/L (ref 96–106)
Creatinine, Ser: 0.76 mg/dL (ref 0.57–1.00)
Globulin, Total: 2.4 g/dL (ref 1.5–4.5)
Glucose: 92 mg/dL (ref 65–99)
Potassium: 3.6 mmol/L (ref 3.5–5.2)
Sodium: 138 mmol/L (ref 134–144)
Total Protein: 7 g/dL (ref 6.0–8.5)
eGFR: 100 mL/min/{1.73_m2} (ref >59)

## 2021-06-20 LAB — THYROID PANEL WITH TSH
Free Thyroxine Index: 1.5 (ref 1.2–4.9)
T3 Uptake Ratio: 23 % — ABNORMAL LOW (ref 24–39)
T4, Total: 6.7 ug/dL (ref 4.5–12.0)
TSH: 9.94 u[IU]/mL — ABNORMAL HIGH (ref 0.450–4.500)

## 2021-06-20 LAB — HIV ANTIBODY (ROUTINE TESTING W REFLEX): HIV Screen 4th Generation wRfx: NONREACTIVE

## 2021-06-20 LAB — HEPATITIS C ANTIBODY: Hep C Virus Ab: 0.1 s/co ratio (ref 0.0–0.9)

## 2021-06-20 LAB — LIPID PANEL W/O CHOL/HDL RATIO
Cholesterol, Total: 160 mg/dL (ref 100–199)
HDL: 79 mg/dL (ref >39)
LDL Chol Calc (NIH): 71 mg/dL (ref 0–99)
Triglycerides: 48 mg/dL (ref 0–149)
VLDL Cholesterol Cal: 10 mg/dL (ref 5–40)

## 2021-06-20 MED ORDER — LEVOTHYROXINE SODIUM 25 MCG PO TABS: 25.0000 ug | ORAL_TABLET | Freq: Every day | ORAL | 1 refills | Status: DC

## 2021-06-20 NOTE — Progress Notes (Signed)
With TSH 9.9 and T4 in normal range will treat for subclinical hypothyroidism. Will start levothyroxine 73mcg daily. Recheck TSH and TPO antibodies in 6-8 weeks.

## 2021-06-20 NOTE — Addendum Note (Signed)
Addended by: Vance Peper A on: 06/20/2021 11:06 AM   Modules accepted: Orders

## 2021-07-04 ENCOUNTER — Other Ambulatory Visit: Payer: Self-pay

## 2021-07-04 ENCOUNTER — Ambulatory Visit
Admission: RE | Admit: 2021-07-04 | Discharge: 2021-07-04 | Disposition: A | Discharge status: Home / Self Care | Payer: BC Managed Care – PPO | Source: Ambulatory Visit | Attending: Nurse Practitioner | Admitting: Nurse Practitioner

## 2021-07-04 DIAGNOSIS — Z1231 Encounter for screening mammogram for malignant neoplasm of breast: Secondary | ICD-10-CM | POA: Insufficient documentation

## 2021-07-07 ENCOUNTER — Other Ambulatory Visit: Payer: Self-pay | Admitting: Nurse Practitioner

## 2021-07-07 DIAGNOSIS — R928 Other abnormal and inconclusive findings on diagnostic imaging of breast: Secondary | ICD-10-CM

## 2021-07-11 ENCOUNTER — Ambulatory Visit
Admission: RE | Admit: 2021-07-11 | Discharge: 2021-07-11 | Disposition: A | Discharge status: Home / Self Care | Payer: BC Managed Care – PPO | Source: Ambulatory Visit | Attending: Nurse Practitioner | Admitting: Nurse Practitioner

## 2021-07-11 ENCOUNTER — Other Ambulatory Visit: Payer: Self-pay

## 2021-07-11 DIAGNOSIS — R922 Inconclusive mammogram: Secondary | ICD-10-CM | POA: Diagnosis not present

## 2021-07-11 DIAGNOSIS — R928 Other abnormal and inconclusive findings on diagnostic imaging of breast: Secondary | ICD-10-CM

## 2021-07-18 ENCOUNTER — Other Ambulatory Visit (HOSPITAL_COMMUNITY)
Admission: RE | Admit: 2021-07-18 | Discharge: 2021-07-18 | Disposition: A | Discharge status: Home / Self Care | Payer: BC Managed Care – PPO | Source: Ambulatory Visit | Attending: Nurse Practitioner | Admitting: Nurse Practitioner

## 2021-07-18 ENCOUNTER — Ambulatory Visit: Payer: BC Managed Care – PPO | Admitting: Nurse Practitioner

## 2021-07-18 ENCOUNTER — Other Ambulatory Visit: Payer: Self-pay

## 2021-07-18 ENCOUNTER — Encounter: Payer: Self-pay | Admitting: Nurse Practitioner

## 2021-07-18 VITALS — BP 113/73 | HR 94 | Temp 98.6°F | Wt 166.6 lb

## 2021-07-18 DIAGNOSIS — E039 Hypothyroidism, unspecified: Secondary | ICD-10-CM | POA: Diagnosis not present

## 2021-07-18 DIAGNOSIS — Z124 Encounter for screening for malignant neoplasm of cervix: Secondary | ICD-10-CM | POA: Insufficient documentation

## 2021-07-18 DIAGNOSIS — N814 Uterovaginal prolapse, unspecified: Secondary | ICD-10-CM | POA: Diagnosis not present

## 2021-07-18 NOTE — Assessment & Plan Note (Signed)
Only took the levothyroxine for a few weeks. Encouraged her to keep taking it as she has had thyroid problems for a while. Will check TSH and TPO antibodies in 6-8 weeks. F/U if any concerns.

## 2021-07-18 NOTE — Progress Notes (Signed)
Established Patient Office Visit  Subjective:  Patient ID: Kari Acosta, female    DOB: [birth year]  Age: 45 y.o. MRN: [id number]  CC:  Chief Complaint  Patient presents with   Gynecologic Exam    Patient denies having any problems or concerns at today's visit.     HPI Kari Acosta presents for routine pap smear. She has been having feelings of pressure in her lower abdomen/pelvic area. She denies urinary frequency, urgency, and burning, just a pressure.   She had started to take her levothyroxine, however she noticed that it made her gain a few pounds and feel "off." She has not been taking it for the last few weeks.   Past Medical History:  Diagnosis Date   Renal disorder    kidney stones    Past Surgical History:  Procedure Laterality Date   CESAREAN SECTION     CHOLECYSTECTOMY     GALLBLADDER SURGERY      Family History  Problem Relation Age of Onset   Colon cancer Mother    Hypertension Mother    Hyperlipidemia Father    Hypertension Father    Stroke Father    Healthy Son    Healthy Son    Cancer Maternal Aunt    Breast cancer Maternal Grandmother    Gallbladder disease Maternal Grandmother        Gallbladder Cancer   Lung cancer Maternal Grandmother    Hypertension Maternal Grandfather    Heart failure Paternal Grandmother    Tuberculosis Paternal Grandfather    Cancer - Other Maternal Great-grandmother        Gallbladder Cancer    Social History   Socioeconomic History   Marital status: Single    Spouse name: Not on file   Number of children: Not on file   Years of education: Not on file   Highest education level: Not on file  Occupational History   Not on file  Tobacco Use   Smoking status: Never   Smokeless tobacco: Never  Vaping Use   Vaping Use: Never used  Substance and Sexual Activity   Alcohol use: No   Drug use: Never   Sexual activity: Yes  Other Topics Concern   Not on file  Social History Narrative   Not on file    Social Determinants of Health   Financial Resource Strain: Not on file  Food Insecurity: Not on file  Transportation Needs: Not on file  Physical Activity: Not on file  Stress: Not on file  Social Connections: Not on file  Intimate Partner Violence: Not on file    Outpatient Medications Prior to Visit  Medication Sig Dispense Refill   levothyroxine (SYNTHROID) 25 MCG tablet Take 1 tablet (25 mcg total) by mouth daily. 30 tablet 1   ibuprofen (ADVIL) 800 MG tablet Take 1 tablet (800 mg total) by mouth every 8 (eight) hours as needed for moderate pain. (Patient not taking: No sig reported) 21 tablet 0   lidocaine (LIDODERM) 5 % Place 1 patch onto the skin daily. Remove & Discard patch within 12 hours or as directed by MD (Patient not taking: No sig reported) 10 patch 0   tiZANidine (ZANAFLEX) 4 MG tablet Take 1 tablet (4 mg total) by mouth every 8 (eight) hours as needed for muscle spasms. (Patient not taking: No sig reported) 30 tablet 0   No facility-administered medications prior to visit.    No Known Allergies  ROS Review of Systems  Constitutional: Negative.  HENT: Negative.    Respiratory: Negative.    Cardiovascular: Negative.   Gastrointestinal: Negative.   Genitourinary:  Negative for dysuria, frequency, hematuria, urgency and vaginal discharge.       Pelvic pressure  Musculoskeletal: Negative.   Skin: Negative.   Neurological: Negative.      Objective:    Physical Exam Vitals and nursing note reviewed. Exam conducted with a chaperone present.  Constitutional:      General: She is not in acute distress.    Appearance: Normal appearance.  HENT:     Head: Normocephalic and atraumatic.  Eyes:     Conjunctiva/sclera: Conjunctivae normal.  Cardiovascular:     Rate and Rhythm: Normal rate and regular rhythm.     Pulses: Normal pulses.     Heart sounds: Normal heart sounds.  Pulmonary:     Effort: Pulmonary effort is normal.     Breath sounds: Normal breath  sounds.  Genitourinary:    General: Normal vulva.     Exam position: Lithotomy position.     Vagina: Prolapsed vaginal walls present.     Cervix: Normal.     Adnexa: Right adnexa normal and left adnexa normal.  Musculoskeletal:     Cervical back: Normal range of motion.  Skin:    General: Skin is warm and dry.  Neurological:     General: No focal deficit present.     Mental Status: She is alert and oriented to person, place, and time.  Psychiatric:        Mood and Affect: Mood normal.        Behavior: Behavior normal.        Thought Content: Thought content normal.        Judgment: Judgment normal.    BP 113/73   Pulse 94   Temp 98.6 F (37 C) (Oral)   Wt 166 lb 9.6 oz (75.6 kg)   LMP 06/25/2021   SpO2 98%   BMI 25.33 kg/m  Wt Readings from Last 3 Encounters:  07/18/21 166 lb 9.6 oz (75.6 kg)  06/19/21 167 lb 3.2 oz (75.8 kg)  09/13/20 168 lb (76.2 kg)     Health Maintenance Due  Topic Date Due   COVID-19 Vaccine (1) Never done   PAP SMEAR-Modifier  Never done   INFLUENZA VACCINE  07/14/2021    There are no preventive care reminders to display for this patient.  Lab Results  Component Value Date   TSH 9.940 (H) 06/19/2021   Lab Results  Component Value Date   WBC 5.0 06/19/2021   HGB 10.8 (L) 06/19/2021   HCT 33.7 (L) 06/19/2021   MCV 82 06/19/2021   PLT 162 06/19/2021   Lab Results  Component Value Date   NA 138 06/19/2021   K 3.6 06/19/2021   CO2 23 06/19/2021   GLUCOSE 92 06/19/2021   BUN 9 06/19/2021   CREATININE 0.76 06/19/2021   BILITOT 0.5 06/19/2021   ALKPHOS 40 (L) 06/19/2021   AST 17 06/19/2021   ALT 11 06/19/2021   PROT 7.0 06/19/2021   ALBUMIN 4.6 06/19/2021   CALCIUM 9.3 06/19/2021   ANIONGAP 7 09/12/2018   EGFR 100 06/19/2021   Lab Results  Component Value Date   CHOL 160 06/19/2021   Lab Results  Component Value Date   HDL 79 06/19/2021   Lab Results  Component Value Date   LDLCALC 71 06/19/2021   Lab Results   Component Value Date   TRIG 48 06/19/2021   No  results found for: CHOLHDL No results found for: HGBA1C    Assessment & Plan:   Problem List Items Addressed This Visit       Endocrine   Hypothyroidism    Only took the levothyroxine for a few weeks. Encouraged her to keep taking it as she has had thyroid problems for a while. Will check TSH and TPO antibodies in 6-8 weeks. F/U if any concerns.        Relevant Orders   Thyroid peroxidase antibody   TSH     Genitourinary   Uterine prolapse    Prolapse felt on exam, unable to determine uterine vs bladder. Will refer to GYN for further evaluation. Kegel exercises given.        Relevant Orders   Ambulatory referral to Gynecology   Other Visit Diagnoses     Screening for cervical cancer    -  Primary   Pap smear done today   Relevant Orders   Cytology - PAP       No orders of the defined types were placed in this encounter.   Follow-up: Return in about 1 year (around 07/18/2022) for 6-8 weeks for lab visit; 1 year for CPE.    Charyl Dancer, NP

## 2021-07-18 NOTE — Assessment & Plan Note (Signed)
Prolapse felt on exam, unable to determine uterine vs bladder. Will refer to GYN for further evaluation. Kegel exercises given.

## 2021-07-25 LAB — CYTOLOGY - PAP
Adequacy: ABSENT
Comment: NEGATIVE
Diagnosis: UNDETERMINED — AB
High risk HPV: NEGATIVE

## 2021-08-11 ENCOUNTER — Ambulatory Visit: Payer: BC Managed Care – PPO | Admitting: Obstetrics & Gynecology

## 2021-08-11 ENCOUNTER — Encounter: Payer: Self-pay | Admitting: Obstetrics & Gynecology

## 2021-08-11 ENCOUNTER — Other Ambulatory Visit: Payer: Self-pay

## 2021-08-11 VITALS — BP 120/80 | Ht 68.0 in | Wt 171.0 lb

## 2021-08-11 DIAGNOSIS — N814 Uterovaginal prolapse, unspecified: Secondary | ICD-10-CM | POA: Diagnosis not present

## 2021-08-11 NOTE — Patient Instructions (Signed)
Total Laparoscopic Hysterectomy A total laparoscopic hysterectomy is a minimally invasive surgery to remove the uterus and cervix. The fallopian tubes and ovaries can also be removed during this surgery, if necessary. This procedure may be done to treat problems such as: Growths in the uterus (uterine fibroids) that are not cancer but cause symptoms. A condition that causes the lining of the uterus to grow in other areas (endometriosis). Problems with pelvic support. Cancer of the cervix, ovaries, uterus, or tissue that lines the uterus (endometrium). Excessive bleeding in the uterus. After this procedure, you will no longer be able to have a baby, and you willno longer have a menstrual period. Tell a health care provider about: Any allergies you have. All medicines you are taking, including vitamins, herbs, eye drops, creams, and over-the-counter medicines. Any problems you or family members have had with anesthetic medicines. Any blood disorders you have. Any surgeries you have had. Any medical conditions you have. Whether you are pregnant or may be pregnant. What are the risks? Generally, this is a safe procedure. However, problems may occur, including: Infection. Bleeding. Blood clots in the legs or lungs. Allergic reactions to medicines. Damage to nearby structures or organs. Having to change from this surgery to one in which a large incision is made in the abdomen (abdominal hysterectomy). What happens before the procedure? Staying hydrated Follow instructions from your health care provider about hydration, which may include: Up to 2 hours before the procedure - you may continue to drink clear liquids, such as water, clear fruit juice, black coffee, and plain tea.  Eating and drinking restrictions Follow instructions from your health care provider about eating and drinking, which may include: 8 hours before the procedure - stop eating heavy meals or foods, such as meat, fried  foods, or fatty foods. 6 hours before the procedure - stop eating light meals or foods, such as toast or cereal. 6 hours before the procedure - stop drinking milk or drinks that contain milk. 2 hours before the procedure - stop drinking clear liquids. Medicines Ask your health care provider about: Changing or stopping your regular medicines. This is especially important if you are taking diabetes medicines or blood thinners. Taking medicines such as aspirin and ibuprofen. These medicines can thin your blood. Do not take these medicines unless your health care provider tells you to take them. Taking over-the-counter medicines, vitamins, herbs, and supplements. You may be asked to take medicine that helps you have a bowel movement (laxative) to prevent constipation. General instructions If you were asked to do bowel preparation before the procedure, follow instructions from your health care provider. This procedure can affect the way you feel about yourself. Talk with your health care provider about the physical and emotional changes hysterectomy may cause. Do not use any products that contain nicotine or tobacco for at least 4 weeks before the procedure. These products include cigarettes, chewing tobacco, and vaping devices, such as e-cigarettes. If you need help quitting, ask your health care provider. Plan to have a responsible adult take you home from the hospital or clinic. Plan to have a responsible adult care for you for the time you are told after you leave the hospital or clinic. This is important. Surgery safety Ask your health care provider: How your surgery site will be marked. What steps will be taken to help prevent infection. These may include: Removing hair at the surgery site. Washing skin with a germ-killing soap. Receiving antibiotic medicine. What happens during the procedure?   An IV will be inserted into one of your veins. You will be given one or more of the following: A  medicine to help you relax (sedative). A medicine to make you fall asleep (general anesthetic). A medicine to numb the area (local anesthetic). A medicine that is injected into your spine to numb the area below and slightly above the injection site (spinal anesthetic). A medicine that is injected into an area of your body to numb everything below the injection site (regional anesthetic). A gas will be used to inflate your abdomen. This will allow your surgeon to look inside your abdomen and do the surgery. Three or four small incisions will be made in your abdomen. A small device with a light (laparoscope) will be inserted into one of your incisions. Surgical instruments will be inserted through the other incisions in order to perform the procedure. Your uterus and cervix may be removed through your vagina or cut into small pieces and removed through the small incisions. Any other organs that need to be removed will also be removed this way. The gas will be released from inside your abdomen. Your incisions will be closed with stitches (sutures), skin glue, or adhesive strips. A bandage (dressing) may be placed over your incisions. The procedure may vary among health care providers and hospitals. What happens after the procedure? Your blood pressure, heart rate, breathing rate, and blood oxygen level will be monitored until you leave the hospital or clinic. You will be given medicine for pain as needed. You will be encouraged to walk as soon as possible. You will also use a device to help you breathe or do breathing exercises to keep your lungs clear. You may have to wear compression stockings. These stockings help to prevent blood clots and reduce swelling in your legs. You will need to wear a sanitary pad for vaginal discharge or bleeding. Summary Total laparoscopic hysterectomy is a procedure to remove your uterus, cervix, and sometimes the fallopian tubes and ovaries. This procedure can  affect the way you feel about yourself. Talk with your health care provider about the physical and emotional changes hysterectomy may cause. After this procedure, you will no longer be able to have a baby, and you will no longer have a menstrual period. You will be given pain medicine to control discomfort after this procedure. Plan to have a responsible adult take you home from the hospital or clinic. This information is not intended to replace advice given to you by your health care provider. Make sure you discuss any questions you have with your healthcare provider. Document Revised: 08/02/2020 Document Reviewed: 08/02/2020 Elsevier Patient Education  2022 [place].  Pelvic Organ Prolapse Pelvic organ prolapse is a condition in women that involves the stretching, bulging, or dropping of pelvic organs into an abnormal position, past the opening of the vagina. It happens when the muscles and tissues that surround and support pelvic structures become weak or stretched. Pelvic organ prolapse can involve the: Vagina (vaginal prolapse). Uterus (uterine prolapse). Bladder (cystocele). Rectum (rectocele). Intestines (enterocele). When organs other than the vagina are involved, they often bulge into thevagina or protrude from the vagina, depending on how severe the prolapse is. What are the causes? This condition may be caused by: Pregnancy, labor, and childbirth. Past pelvic surgery. Lower levels of the hormone estrogen due to menopause. Consistently lifting more than 50 lb (23 kg). Obesity. Long-term difficulty passing stool (chronic constipation). Long-term, or chronic, cough. Fluid buildup in the abdomen due  to certain conditions. What are the signs or symptoms? Symptoms of this condition include: Leaking a little urine (loss of bladder control) when you cough, sneeze, strain, and exercise (stress incontinence). This may be worse immediately after childbirth. It may gradually improve  over time. Feeling pressure in your pelvis or vagina. This pressure may increase when you cough or when you are passing stool. A bulge that protrudes from the opening of your vagina. Difficulty passing urine or stool. Pain in your lower back. Pain or discomfort during sex, or decreased interest in sex. Repeated bladder infections (urinary tract infections). Difficulty inserting a tampon. In some people, this condition causes no symptoms. How is this diagnosed? This condition may be diagnosed based on a vaginal and rectal exam. During the exam, you may be asked to cough and strain while you are lying down, sitting, and standing up. Your health care provider will determine if other tests arerequired, such as bladder function tests. How is this treated? Treatment for this condition may depend on your symptoms. Treatment may include: Lifestyle changes, such as drinking plenty of fluids and eating foods that are high in fiber. Emptying your bladder at scheduled times (bladder training therapy). This can help reduce or avoid urinary incontinence. Estrogen. This may help mild prolapse by increasing the strength and tone of pelvic floor muscles. Kegel exercises. These may help mild cases of prolapse by strengthening and tightening the muscles of the pelvic floor. A soft, flexible device that helps support the vaginal walls and keep pelvic organs in place (pessary). This is inserted into your vagina by your health care provider. Surgery. This is often the only form of treatment for severe prolapse. Follow these instructions at home: Eating and drinking Avoid drinking beverages that contain caffeine or alcohol. Increase your intake of high-fiber foods to decrease constipation and straining during bowel movements. Activity Lose weight if recommended by your health care provider. Avoid heavy lifting and straining with exercise and work. Do not hold your breath when you perform mild to moderate lifting  and exercise activities. Limit your activities as directed by your health care provider. Do Kegel exercises as directed by your health care provider. To do this: Squeeze your pelvic floor muscles tight. You should feel a tight lift in your rectal area and a tightness in your vaginal area. Keep your stomach, buttocks, and legs relaxed. Hold the muscles tight for up to 10 seconds. Then relax your muscles. Repeat this exercise 50 times a day, or as much as told by your health care provider. Continue to do this exercise for at least 4-6 weeks, or for as long as told by your health care provider. General instructions Take over-the-counter and prescription medicines only as told by your health care provider. Wear a sanitary pad or adult diapers if you have urinary incontinence. If you have a pessary, take care of it as told by your health care provider. Keep all follow-up visits. This is important. Contact a health care provider if you: Have symptoms that interfere with your daily activities or sex life. Need medicine to help with the discomfort. Notice bleeding from your vagina that is not related to your menstrual period. Have a fever. Have pain or bleeding when you urinate. Have bleeding when you pass stool. Pass urine when you have sex. Have chronic constipation. Have a pessary that falls out. Have a foul-smelling vaginal discharge. Have an unusual, low pain in your abdomen. Get help right away if you: Cannot pass urine. Summary Pelvic  organ prolapse is the stretching, bulging, or dropping of pelvic organs into an abnormal position. It happens when the muscles and tissues that surround and support pelvic structures become weak or stretched. When organs other than the vagina are involved, they often bulge into the vagina or protrude from it, depending on how severe the prolapse is. In most cases, this condition needs to be treated only if it produces symptoms. Treatment may include lifestyle  changes, estrogen, Kegel exercises, pessary insertion, or surgery. Avoid heavy lifting and straining with exercise and work. Do not hold your breath when you perform mild to moderate lifting and exercise activities. Limit your activities as directed by your health care provider. This information is not intended to replace advice given to you by your health care provider. Make sure you discuss any questions you have with your healthcare provider. Document Revised: 05/27/2020 Document Reviewed: 05/27/2020 Elsevier Patient Education  [place].

## 2021-08-11 NOTE — Progress Notes (Signed)
Consultant: Vance Peper, NP Reason- pelvic prolapse  Patient is a 45 yo G2P2 WF who complains of a symptoms related to pelvic organ prolapse that are significant in their discomfort and bother to the patient.  She has pressure to the point of even pain at times in the lower abdomen (midline) and vagina.  Reg cycles, no issues.  No pain w sex, but does feel the uterus or "something" is in the way.  She has low libido, with unclear reasons why.  Problem started a few months ago. Symptoms include: prolapse of tissue with straining, discomfort: moderate, and incomplete urinary emptying and urinary frequency.  Symptoms have gradually worsened.    She has h/o 2 prior cesarean sections.  Also, prior BTL.  Prior lap chole.  Denies incontinence.  PMHx: She  has a past medical history of Renal disorder. Also,  has a past surgical history that includes Cesarean section; Gallbladder surgery; and Cholecystectomy., family history includes Breast cancer in her maternal grandmother; Cancer in her maternal aunt; Cancer - Other in her maternal great-grandmother; Colon cancer in her mother; Gallbladder disease in her maternal grandmother; Healthy in her son and son; Heart failure in her paternal grandmother; Hyperlipidemia in her father; Hypertension in her father, maternal grandfather, and mother; Lung cancer in her maternal grandmother; Stroke in her father; Tuberculosis in her paternal grandfather.,  reports that she has never smoked. She has never used smokeless tobacco. She reports that she does not drink alcohol and does not use drugs.  She has a current medication list which includes the following prescription(s): ibuprofen, levothyroxine, lidocaine, tizanidine, and [DISCONTINUED] prochlorperazine. Also, has No Known Allergies.  Review of Systems  Constitutional:  Negative for chills, fever and malaise/fatigue.  HENT:  Negative for congestion, sinus pain and sore throat.   Eyes:  Negative for blurred vision  and pain.  Respiratory:  Negative for cough and wheezing.   Cardiovascular:  Negative for chest pain and leg swelling.  Gastrointestinal:  Negative for abdominal pain, constipation, diarrhea, heartburn, nausea and vomiting.  Genitourinary:  Positive for frequency. Negative for dysuria, hematuria and urgency.  Musculoskeletal:  Negative for back pain, joint pain, myalgias and neck pain.  Skin:  Negative for itching and rash.  Neurological:  Negative for dizziness, tremors and weakness.  Endo/Heme/Allergies:  Does not bruise/bleed easily.  Psychiatric/Behavioral:  Negative for depression. The patient is not nervous/anxious and does not have insomnia.    Objective: BP 120/80   Ht '5\' 8"'$  (1.727 m)   Wt 171 lb (77.6 kg)   LMP 07/27/2021   BMI 26.00 kg/m  Physical Exam Constitutional:      General: She is not in acute distress.    Appearance: She is well-developed.  Genitourinary:     Bladder and urethral meatus normal.     Right Labia: No rash or tenderness.    Left Labia: No tenderness or rash.    No vaginal erythema or bleeding.     No vaginal prolapse present.     Right Adnexa: not tender and no mass present.    Left Adnexa: not tender and no mass present.    No cervical motion tenderness, discharge, polyp or nabothian cyst.     Uterus is prolapsed.     Uterus is not enlarged.     No uterine mass detected.    Uterus exam comments: Gr 1-2 Anteverted.     Uterus is anteverted.     Bladder exam comments: No cystocele .  Pelvic exam was performed with patient in the lithotomy position.  HENT:     Head: Normocephalic and atraumatic.     Nose: Nose normal.  Abdominal:     General: There is no distension.     Palpations: Abdomen is soft.     Tenderness: There is no abdominal tenderness.  Musculoskeletal:        General: Normal range of motion.  Neurological:     Mental Status: She is alert and oriented to person, place, and time.     Cranial Nerves: No cranial nerve  deficit.  Skin:    General: Skin is warm and dry.  Psychiatric:        Attention and Perception: Attention normal.        Mood and Affect: Mood and affect normal.        Speech: Speech normal.        Behavior: Behavior normal.        Thought Content: Thought content normal.        Judgment: Judgment normal.    ASSESSMENT/PLAN:    Problem List Items Addressed This Visit     Genitourinary   Uterine prolapse - Primary   Options discussed for treatment of pelvic organ prolapse, such as pessary use, hysterectomy (different approaches based on exam), expectant management (no treatment, as is not a life threatening concern).  As there is min-no cystocele, additional procedures are not recommended.  She will consider the option of laparoscopic hysterectomy.  Discussed preservation of ovaries.  Info gv.  Recovery discussed.   As this was a consultation, I have sent information to NP [facility] pertaining to todays visit, to include in the discussions involving her care.  Barnett Applebaum, MD, [facility], [facility] 08/11/2021  2:56 PM

## 2021-08-14 ENCOUNTER — Other Ambulatory Visit: Payer: Self-pay

## 2021-08-14 ENCOUNTER — Ambulatory Visit: Payer: BC Managed Care – PPO | Admitting: Gastroenterology

## 2021-08-14 ENCOUNTER — Encounter: Payer: Self-pay | Admitting: Gastroenterology

## 2021-08-14 ENCOUNTER — Other Ambulatory Visit: Payer: Self-pay | Admitting: Gastroenterology

## 2021-08-14 VITALS — BP 142/94 | HR 84 | Temp 97.6°F | Ht 68.0 in | Wt 170.2 lb

## 2021-08-14 DIAGNOSIS — Z800 Family history of malignant neoplasm of digestive organs: Secondary | ICD-10-CM

## 2021-08-14 DIAGNOSIS — K5904 Chronic idiopathic constipation: Secondary | ICD-10-CM | POA: Diagnosis not present

## 2021-08-14 DIAGNOSIS — Z8041 Family history of malignant neoplasm of ovary: Secondary | ICD-10-CM

## 2021-08-14 HISTORY — DX: Family history of malignant neoplasm of ovary: Z80.41

## 2021-08-14 MED ORDER — NA SULFATE-K SULFATE-MG SULF 17.5-3.13-1.6 GM/177ML PO SOLN: 1.0000 | Freq: Once | ORAL | 0 refills | Status: AC

## 2021-08-14 NOTE — Progress Notes (Signed)
Gastroenterology Consultation  Referring Provider:     Charyl Dancer, NP Primary Care Physician:  Kari Dancer, NP Primary Gastroenterologist:  Kari Acosta     Reason for Consultation:     Constipation        HPI:   Kari Acosta is a 45 y.o. y/o female referred for consultation & management of constipation by Dr. Dwan Acosta, Kari Darter, NP.  This patient comes in today with a history of constipation.  The patient states that she has to take a over-the-counter laxative to move her bowels.  She also reports that the laxative causes her abdominal pain.  There is no report of any unexplained weight loss black stools or bloody stools.  The patient does report that her mother was diagnosed with rectal cancer at the age of 4.  She denies that this constipation is new and states that has been going on for a long time.  The patient is also being followed by OB/GYN for uterine prolapse.  The patient is supposed to have surgery for the uterine prolapse and is waiting for authorization for this. She reports that the constipation has been bothering her for many years and is not a new thing.  Past Medical History:  Diagnosis Date   Renal disorder    kidney stones    Past Surgical History:  Procedure Laterality Date   CESAREAN SECTION     CHOLECYSTECTOMY     GALLBLADDER SURGERY      Prior to Admission medications   Medication Sig Start Date End Date Taking? Authorizing Provider  levothyroxine (SYNTHROID) 25 MCG tablet Take 1 tablet (25 mcg total) by mouth daily. 06/20/21  Yes Acosta, Kari A, NP  prochlorperazine (COMPAZINE) 10 MG tablet Take 1 tablet (10 mg total) by mouth every 8 (eight) hours as needed (headache). 09/12/18 09/10/20  Kari Pear, MD    Family History  Problem Relation Age of Onset   Colon cancer Mother    Hypertension Mother    Hyperlipidemia Father    Hypertension Father    Stroke Father    Healthy Son    Healthy Son    Cancer Maternal Aunt    Breast cancer  Maternal Grandmother    Gallbladder disease Maternal Grandmother        Gallbladder Cancer   Lung cancer Maternal Grandmother    Hypertension Maternal Grandfather    Heart failure Paternal Grandmother    Tuberculosis Paternal Grandfather    Cancer - Other Maternal Great-grandmother        Gallbladder Cancer     Social History   Tobacco Use   Smoking status: Never   Smokeless tobacco: Never  Vaping Use   Vaping Use: Never used  Substance Use Topics   Alcohol use: No   Drug use: Never    Allergies as of 08/14/2021   (No Known Allergies)    Review of Systems:    All systems reviewed and negative except where noted in HPI.   Physical Exam:  BP (!) 142/94   Pulse 84   Temp 97.6 F (36.4 C) (Oral)   Ht '5\' 8"'$  (1.727 m)   Wt 170 lb 3.2 oz (77.2 kg)   LMP 07/27/2021   BMI 25.88 kg/m  Patient's last menstrual period was 07/27/2021. General:   Alert,  Well-developed, well-nourished, pleasant and cooperative in NAD Head:  Normocephalic and atraumatic. Eyes:  Sclera clear, no icterus.   Conjunctiva pink. Ears:  Normal auditory acuity. Neck:  Supple;  no masses or thyromegaly. Lungs:  Respirations even and unlabored.  Clear throughout to auscultation.   No wheezes, crackles, or rhonchi. No acute distress. Heart:  Regular rate and rhythm; no murmurs, clicks, rubs, or gallops. Abdomen:  Normal bowel sounds.  No bruits.  Soft, non-tender and non-distended without masses, hepatosplenomegaly or hernias noted.  No guarding or rebound tenderness.  Negative Carnett sign.   Rectal:  Deferred.  Pulses:  Normal pulses noted. Extremities:  No clubbing or edema.  No cyanosis. Neurologic:  Alert and oriented x3;  grossly normal neurologically. Skin:  Intact without significant lesions or rashes.  No jaundice. Lymph Nodes:  No significant cervical adenopathy. Psych:  Alert and cooperative. Normal mood and affect.  Imaging Studies: No results found.  Assessment and Plan:   Kari Acosta is a 45 y.o. y/o female who comes in today with a history of chronic constipation and a mother with rectal cancer.  The patient's mother was 29 when she was diagnosed with rectal cancer.  The patient has uterine prolapse and usually takes something over-the-counter to move her bowels.  The patient will be set up for colonoscopy due to her family history and her constipation.  She will also be given samples of Linzess at 845 mcg and 290 mcg to see if either 1 of these doses will help her move her bowels on a regular basis.  The patient has been told to take the medication 1/2-hour before she eats.  The patient has been explained the plan and agrees with it.  She will follow-up at the time of the colonoscopy.    Kari Lame, MD. Kari Acosta    Note: This dictation was prepared with Dragon dictation along with smaller phrase technology. Any transcriptional errors that result from this process are unintentional.

## 2021-08-14 NOTE — Addendum Note (Signed)
Addended by: Lurlean Nanny on: 08/14/2021 04:20 PM   Modules accepted: Orders, SmartSet

## 2021-08-19 ENCOUNTER — Other Ambulatory Visit (INDEPENDENT_AMBULATORY_CARE_PROVIDER_SITE_OTHER): Payer: Self-pay | Admitting: Nurse Practitioner

## 2021-08-19 ENCOUNTER — Telehealth: Payer: Self-pay

## 2021-08-19 DIAGNOSIS — I83812 Varicose veins of left lower extremities with pain: Secondary | ICD-10-CM

## 2021-08-19 NOTE — Telephone Encounter (Signed)
-----   Message from Gae Dry, MD sent at 08/11/2021  2:54 PM EDT ----- Regarding: Surgery Surgery Booking Request Patient Full Name:  Kari Acosta  MRN: [id number]  DOB: 1976/03/12  Surgeon: Hoyt Koch, MD  Requested Surgery Date and Time: Any Primary Diagnosis AND Code: Uterine prolapse Secondary Diagnosis and Code: N81.4 Surgical Procedure: TLH/BS RNFA Requested?: No L&D Notification: No Admission Status: same day surgery Length of Surgery: 50 min Special Case Needs: No H&P: Yes Phone Interview???:  Yes Interpreter: No Medical Clearance:  No Special Scheduling Instructions: No Any known health/anesthesia issues, diabetes, sleep apnea, latex allergy, defibrillator/pacemaker?: No Acuity: P3   (P1 highest, P2 delay may cause harm, P3 low, elective gyn, P4 lowest) Post op follow up visits: yes\

## 2021-08-19 NOTE — Telephone Encounter (Signed)
Called patient to schedule TLH/BS w [facility]  DOS 10/11  H&P 10/4 @ 3:10   Pre-admit phone call appointment to be requested - date and time will be included on H&P paper work. Also all appointments will be updated on pt MyChart. Explained that this appointment has a call window. Based on the time scheduled will indicate if the call will be received within a 4 hour window before 1:00 or after.  Advised that pt may also receive calls from the hospital pharmacy and pre-service center.  Confirmed pt has [employer] as Chartered certified accountant. No secondary insurance.

## 2021-08-21 ENCOUNTER — Ambulatory Visit (INDEPENDENT_AMBULATORY_CARE_PROVIDER_SITE_OTHER): Payer: BC Managed Care – PPO

## 2021-08-21 ENCOUNTER — Encounter (INDEPENDENT_AMBULATORY_CARE_PROVIDER_SITE_OTHER): Payer: Self-pay | Admitting: Vascular Surgery

## 2021-08-21 ENCOUNTER — Ambulatory Visit (INDEPENDENT_AMBULATORY_CARE_PROVIDER_SITE_OTHER): Payer: BC Managed Care – PPO | Admitting: Vascular Surgery

## 2021-08-21 ENCOUNTER — Other Ambulatory Visit: Payer: Self-pay

## 2021-08-21 VITALS — BP 131/88 | HR 76 | Resp 16 | Ht 67.0 in | Wt 171.4 lb

## 2021-08-21 DIAGNOSIS — I83812 Varicose veins of left lower extremities with pain: Secondary | ICD-10-CM

## 2021-08-21 NOTE — Telephone Encounter (Signed)
Pt called back with a question regarding DOS. She advised that she believes she will be on her next cycle. She wanted to ask if this would make a difference with her DOS. I advised that I would forward her question to [facility].

## 2021-08-24 ENCOUNTER — Encounter (INDEPENDENT_AMBULATORY_CARE_PROVIDER_SITE_OTHER): Payer: Self-pay | Admitting: Vascular Surgery

## 2021-08-24 NOTE — Telephone Encounter (Signed)
Let her know, it will not make a difference with the timing of surgery and her period

## 2021-08-24 NOTE — Progress Notes (Signed)
MRN : [id number]  Kari Acosta is a 45 y.o. (1976/11/30) female who presents with chief complaint of painful varicose veins.  History of Present Illness:   The patient is seen for evaluation of symptomatic varicose veins. The patient relates burning and stinging which worsened steadily throughout the course of the day, particularly with standing. The patient also notes an aching and throbbing pain over the varicosities, particularly with prolonged dependent positions. The symptoms are significantly improved with elevation.  The patient also notes that during hot weather the symptoms are greatly intensified. The patient states the pain from the varicose veins interferes with work, daily exercise, shopping and household maintenance. At this point, the symptoms are persistent and severe enough that they're having a negative impact on lifestyle and are interfering with daily activities.  There is no history of DVT, PE or superficial thrombophlebitis. There is no history of ulceration or hemorrhage. The patient denies a significant family history of varicose veins.  The patient has worn graduated compression in the past and did not find this helpful.  In fact her veins and her symptoms have progressed in spite of this therapy. At the present time the patient has been using over-the-counter analgesics. There is no history of prior surgical intervention or sclerotherapy.    Current Meds  Medication Sig   levothyroxine (SYNTHROID) 25 MCG tablet Take 1 tablet (25 mcg total) by mouth daily.    Past Medical History:  Diagnosis Date   Renal disorder    kidney stones    Past Surgical History:  Procedure Laterality Date   CESAREAN SECTION     CHOLECYSTECTOMY     GALLBLADDER SURGERY      Social History Social History   Tobacco Use   Smoking status: Never   Smokeless tobacco: Never  Vaping Use   Vaping Use: Never used  Substance Use Topics   Alcohol use: No   Drug use: Never     Family History Family History  Problem Relation Age of Onset   Colon cancer Mother    Hypertension Mother    Hyperlipidemia Father    Hypertension Father    Stroke Father    Healthy Son    Healthy Son    Cancer Maternal Aunt    Breast cancer Maternal Grandmother    Gallbladder disease Maternal Grandmother        Gallbladder Cancer   Lung cancer Maternal Grandmother    Hypertension Maternal Grandfather    Heart failure Paternal Grandmother    Tuberculosis Paternal Grandfather    Cancer - Other Maternal Great-grandmother        Gallbladder Cancer    No Known Allergies   REVIEW OF SYSTEMS (Negative unless checked)  Constitutional: '[]'$ Weight loss  '[]'$ Fever  '[]'$ Chills Cardiac: '[]'$ Chest pain   '[]'$ Chest pressure   '[]'$ Palpitations   '[]'$ Shortness of breath when laying flat   '[]'$ Shortness of breath with exertion. Vascular:  '[]'$ Pain in legs with walking   '[]'$ Pain in legs at rest  '[]'$ History of DVT   '[]'$ Phlebitis   '[x]'$ Swelling in legs   '[]'$ Varicose veins   '[]'$ Non-healing ulcers Pulmonary:   '[]'$ Uses home oxygen   '[]'$ Productive cough   '[]'$ Hemoptysis   '[]'$ Wheeze  '[]'$ COPD   '[]'$ Asthma Neurologic:  '[]'$ Dizziness   '[]'$ Seizures   '[]'$ History of stroke   '[]'$ History of TIA  '[]'$ Aphasia   '[]'$ Vissual changes   '[]'$ Weakness or numbness in arm   '[]'$ Weakness or numbness in leg Musculoskeletal:   '[]'$ Joint swelling   '[]'$ Joint pain   '[]'$   Low back pain Hematologic:  '[]'$ Easy bruising  '[]'$ Easy bleeding   '[]'$ Hypercoagulable state   '[]'$ Anemic Gastrointestinal:  '[]'$ Diarrhea   '[]'$ Vomiting  '[]'$ Gastroesophageal reflux/heartburn   '[]'$ Difficulty swallowing. Genitourinary:  '[]'$ Chronic kidney disease   '[]'$ Difficult urination  '[]'$ Frequent urination   '[]'$ Blood in urine Skin:  '[]'$ Rashes   '[]'$ Ulcers  Psychological:  '[]'$ History of anxiety   '[]'$  History of major depression.  Physical Examination  Vitals:   08/21/21 1531  BP: 131/88  Pulse: 76  Resp: 16  Weight: 171 lb 6.4 oz (77.7 kg)  Height: '5\' 7"'$  (1.702 m)   Body mass index is 26.85 kg/m. Gen: WD/WN,  NAD Head: [place]/AT, No temporalis wasting.  Ear/Nose/Throat: Hearing grossly intact, nares w/o erythema or drainage, pinna without lesions Eyes: PER, EOMI, sclera nonicteric.  Neck: Supple, no gross masses.  No JVD.  Pulmonary:  Good air movement, no audible wheezing, no use of accessory muscles.  Cardiac: RRR, precordium not hyperdynamic. Vascular:  scattered varicosities present bilaterally much more prominent on the left, >8 mm.  Mild venous stasis changes to the legs bilaterally.  2+ soft pitting edema  Vessel Right Left  Radial Palpable Palpable  Gastrointestinal: soft, non-distended. No guarding/no peritoneal signs.  Musculoskeletal: M/S 5/5 throughout.  No deformity.  Neurologic: CN 2-12 intact. Pain and light touch intact in extremities.  Symmetrical.  Speech is fluent. Motor exam as listed above. Psychiatric: Judgment intact, Mood & affect appropriate for pt's clinical situation. Dermatologic: Venous rashes no ulcers noted.  No changes consistent with cellulitis. Lymph : No lichenification or skin changes of chronic lymphedema.  CBC Lab Results  Component Value Date   WBC 5.0 06/19/2021   HGB 10.8 (L) 06/19/2021   HCT 33.7 (L) 06/19/2021   MCV 82 06/19/2021   PLT 162 06/19/2021    BMET    Component Value Date/Time   NA 138 06/19/2021 0926   K 3.6 06/19/2021 0926   CL 101 06/19/2021 0926   CO2 23 06/19/2021 0926   GLUCOSE 92 06/19/2021 0926   GLUCOSE 95 09/12/2018 1656   BUN 9 06/19/2021 0926   CREATININE 0.76 06/19/2021 0926   CALCIUM 9.3 06/19/2021 0926   GFRNONAA >60 09/12/2018 1656   GFRAA >60 09/12/2018 1656   CrCl cannot be calculated (Patient's most recent lab result is older than the maximum 21 days allowed.).  COAG No results found for: INR, PROTIME  Radiology VAS [place] LOWER EXTREMITY VENOUS REFLUX  Result Date: 08/21/2021  Lower Venous Reflux Study Patient Name:  Kari Acosta  Date of Exam:   08/21/2021 Medical Rec #: [id number]         Accession #:     [id number] Date of Birth: 1976-09-09         Patient Gender: F Patient Age:   67 years Exam Location:  [facility] Procedure:      VAS [place] LOWER EXTREMITY VENOUS REFLUX Referring Phys: Eulogio Ditch --------------------------------------------------------------------------------  Indications: Pain, and Swelling.  Performing Technologist: Almira Coaster RVS  Examination Guidelines: A complete evaluation includes B-mode imaging, spectral Doppler, color Doppler, and power Doppler as needed of all accessible portions of each vessel. Bilateral testing is considered an integral part of a complete examination. Limited examinations for reoccurring indications may be performed as noted. The reflux portion of the exam is performed with the patient in reverse Trendelenburg. Significant venous reflux is defined as >500 ms in the superficial venous system, and >1 second in the deep venous system.  +--------------+---------+------+-----------+------------+--------+ LEFT  Reflux NoRefluxReflux TimeDiameter cmsComments                         Yes                                  +--------------+---------+------+-----------+------------+--------+ CFV           no                                             +--------------+---------+------+-----------+------------+--------+ FV prox       no                                             +--------------+---------+------+-----------+------------+--------+ FV mid        no                                             +--------------+---------+------+-----------+------------+--------+ FV dist       no                                             +--------------+---------+------+-----------+------------+--------+ Popliteal     no                                             +--------------+---------+------+-----------+------------+--------+ GSV at SFJ    no                            .56               +--------------+---------+------+-----------+------------+--------+ GSV prox thighno                            .40              +--------------+---------+------+-----------+------------+--------+ GSV mid thigh no                            .29              +--------------+---------+------+-----------+------------+--------+ GSV dist thighno                            .22              +--------------+---------+------+-----------+------------+--------+ GSV at knee   no                            .29              +--------------+---------+------+-----------+------------+--------+ GSV prox calf no                            .21              +--------------+---------+------+-----------+------------+--------+  SSV Pop Fossa no                            .29              +--------------+---------+------+-----------+------------+--------+   Summary: Left: - No evidence of deep vein thrombosis seen in the left lower extremity, from the common femoral through the popliteal veins. - No evidence of superficial venous thrombosis in the left lower extremity. - There is no evidence of venous reflux seen in the left lower extremity. - No evidence of superficial venous reflux seen in the left greater saphenous vein. - No evidence of superficial venous reflux seen in the left short saphenous vein.  *See table(s) above for measurements and observations. Electronically signed by Hortencia Pilar MD on 08/21/2021 at 5:01:27 PM.    Final      Assessment/Plan 1. Varicose veins of left lower extremity with pain Recommend:  The patient has symptoms of pain and swelling that are having a negative impact on daily life and daily activities.  Patient should undergo injection sclerotherapy to treat the varicosities.  The risks, benefits and alternative therapies were reviewed in detail with the patient.  All questions were answered.  The patient agrees to proceed with sclerotherapy at their  convenience.  The patient will continue wearing the graduated compression stockings and using the over-the-counter pain medications to treat her symptoms.         Hortencia Pilar, MD  08/24/2021 3:04 PM

## 2021-08-25 NOTE — Telephone Encounter (Signed)
Pt aware.

## 2021-08-25 NOTE — Telephone Encounter (Signed)
I would probably reschedule colonoscopy to a later date

## 2021-08-25 NOTE — Telephone Encounter (Signed)
Pt has a colonoscopy on  10/21 does she need to reschedule this since her surgery is on 10/11 or will she be ok to do this?

## 2021-08-26 ENCOUNTER — Encounter: Payer: Self-pay | Admitting: Obstetrics and Gynecology

## 2021-08-27 ENCOUNTER — Telehealth: Payer: Self-pay

## 2021-08-27 NOTE — Telephone Encounter (Signed)
[employer] calling for information on the pt; dx, ICD-10, tests, clinical findings; estimated return to work date, tx plan, first day of reporting.  [phone] ext [phone]  Detailed msg left with information.

## 2021-08-29 ENCOUNTER — Other Ambulatory Visit: Payer: BC Managed Care – PPO

## 2021-08-29 ENCOUNTER — Other Ambulatory Visit: Payer: Self-pay

## 2021-08-29 DIAGNOSIS — E039 Hypothyroidism, unspecified: Secondary | ICD-10-CM | POA: Diagnosis not present

## 2021-08-30 LAB — THYROID PEROXIDASE ANTIBODY: Thyroperoxidase Ab SerPl-aCnc: 226 IU/mL — ABNORMAL HIGH (ref 0–34)

## 2021-08-30 LAB — TSH: TSH: 6.26 u[IU]/mL — ABNORMAL HIGH (ref 0.450–4.500)

## 2021-08-30 MED ORDER — LEVOTHYROXINE SODIUM 50 MCG PO TABS: 50.0000 ug | ORAL_TABLET | Freq: Every day | ORAL | 1 refills | Status: DC

## 2021-09-04 ENCOUNTER — Telehealth: Payer: Self-pay | Admitting: Gastroenterology

## 2021-09-04 NOTE — Telephone Encounter (Signed)
Pt. Having hysterectomy on the 11th she needs to reschedule colonoscopy

## 2021-09-08 ENCOUNTER — Telehealth: Payer: Self-pay

## 2021-09-08 ENCOUNTER — Other Ambulatory Visit: Payer: Self-pay

## 2021-09-08 MED ORDER — LINACLOTIDE 290 MCG PO CAPS: 290.0000 ug | ORAL_CAPSULE | Freq: Every day | ORAL | 3 refills | Status: DC

## 2021-09-08 NOTE — Progress Notes (Signed)
PATIENT ALSO REQUESTED A MED REFILL SO I SENT IN A REQUEST TO BE SIGNED BY [facility]

## 2021-09-08 NOTE — Telephone Encounter (Signed)
RESCHEDULED PATIENT TO 12/02/2021 [facility] AT [facility] PATIENT REQUESTED CHANGES BECAUSE OF AANOTHE RSURGERY SHE IS HAVING 09/23/2021

## 2021-09-09 ENCOUNTER — Other Ambulatory Visit
Admission: RE | Admit: 2021-09-09 | Discharge: 2021-09-09 | Disposition: A | Discharge status: Home / Self Care | Payer: BC Managed Care – PPO | Source: Ambulatory Visit | Attending: Obstetrics & Gynecology | Admitting: Obstetrics & Gynecology

## 2021-09-09 ENCOUNTER — Other Ambulatory Visit: Payer: Self-pay | Admitting: Obstetrics & Gynecology

## 2021-09-09 ENCOUNTER — Other Ambulatory Visit: Payer: Self-pay

## 2021-09-09 HISTORY — DX: Hypothyroidism, unspecified: E03.9

## 2021-09-09 HISTORY — DX: Personal history of urinary calculi: Z87.442

## 2021-09-09 NOTE — Patient Instructions (Addendum)
Your procedure is scheduled on: 09/23/21 - Tuesday Report to the Registration Desk on the 1st floor of the [facility]. To find out your arrival time, please call [phone] between 1PM - 3PM on: 09/22/21 - Monday - [facility] for Labs  REMEMBER: Instructions that are not followed completely may result in serious medical risk, up to and including death; or upon the discretion of your surgeon and anesthesiologist your surgery may need to be rescheduled.  Do not eat food after midnight the night before surgery.  No gum chewing, lozengers or hard candies.  You may however, drink CLEAR liquids up to 2 hours before you are scheduled to arrive for your surgery. Do not drink anything within 2 hours of your scheduled arrival time.  Clear liquids include: - water  - apple juice without pulp - gatorade (not RED, PURPLE, OR BLUE) - black coffee or tea (Do NOT add milk or creamers to the coffee or tea) Do NOT drink anything that is not on this list.  TAKE THESE MEDICATIONS THE MORNING OF SURGERY WITH A SIP OF WATER:  - levothyroxine (SYNTHROID) 50 MCG tablet - linaclotide (LINZESS) 290 MCG CAPS capsule  One week prior to surgery: Stop Anti-inflammatories (NSAIDS) such as Advil, Aleve, Ibuprofen, Motrin, Naproxen, Naprosyn and Aspirin based products such as Excedrin, Goodys Powder, BC Powder.  Stop ANY OVER THE COUNTER supplements until after surgery.  You may however, continue to take Tylenol if needed for pain up until the day of surgery.  No Alcohol for 24 hours before or after surgery.  No Smoking including e-cigarettes for 24 hours prior to surgery.  No chewable tobacco products for at least 6 hours prior to surgery.  No nicotine patches on the day of surgery.  Do not use any "recreational" drugs for at least a week prior to your surgery.  Please be advised that the combination of cocaine and anesthesia may have negative outcomes, up to and including death. If you  test positive for cocaine, your surgery will be cancelled.  On the morning of surgery brush your teeth with toothpaste and water, you may rinse your mouth with mouthwash if you wish. Do not swallow any toothpaste or mouthwash.  Use CHG Soap or wipes as directed on instruction sheet.  Do not wear jewelry, make-up, hairpins, clips or nail polish.  Do not wear lotions, powders, or perfumes.   Do not shave body from the neck down 48 hours prior to surgery just in case you cut yourself which could leave a site for infection.  Also, freshly shaved skin may become irritated if using the CHG soap.  Contact lenses, hearing aids and dentures may not be worn into surgery.  Do not bring valuables to the hospital. [facility] is not responsible for any missing/lost belongings or valuables.   Notify your doctor if there is any change in your medical condition (cold, fever, infection).  Wear comfortable clothing (specific to your surgery type) to the hospital.  After surgery, you can help prevent lung complications by doing breathing exercises.  Take deep breaths and cough every 1-2 hours. Your doctor may order a device called an Incentive Spirometer to help you take deep breaths. When coughing or sneezing, hold a pillow firmly against your incision with both hands. This is called "splinting." Doing this helps protect your incision. It also decreases belly discomfort.  If you are being admitted to the hospital overnight, leave your suitcase in the car. After surgery it may  be brought to your room.  If you are being discharged the day of surgery, you will not be allowed to drive home. You will need a responsible adult (18 years or older) to drive you home and stay with you that night.   If you are taking public transportation, you will need to have a responsible adult (18 years or older) with you. Please confirm with your physician that it is acceptable to use public transportation.   Please call  the [facility] at [phone] if you have any questions about these instructions.  Surgery Visitation Policy:  Patients undergoing a surgery or procedure may have one family member or support person with them as long as that person is not COVID-19 positive or experiencing its symptoms.  That person may remain in the waiting area during the procedure and may rotate out with other people.  Inpatient Visitation:    Visiting hours are 7 a.m. to 8 p.m. Up to two visitors ages 16+ are allowed at one time in a patient room. The visitors may rotate out with other people during the day. Visitors must check out when they leave, or other visitors will not be allowed. One designated support person may remain overnight. The visitor must pass COVID-19 screenings, use hand sanitizer when entering and exiting the patient's room and wear a mask at all times, including in the patient's room. Patients must also wear a mask when staff or their visitor are in the room. Masking is required regardless of vaccination status.

## 2021-09-14 ENCOUNTER — Other Ambulatory Visit: Payer: Self-pay | Admitting: Nurse Practitioner

## 2021-09-14 NOTE — Telephone Encounter (Signed)
Request for med that was discontinued. Called pharmacy and spoke with Alroy Dust and he stated he will take med off her record

## 2021-09-15 ENCOUNTER — Other Ambulatory Visit
Admission: RE | Admit: 2021-09-15 | Discharge: 2021-09-15 | Disposition: A | Discharge status: Home / Self Care | Payer: BC Managed Care – PPO | Source: Ambulatory Visit | Attending: Obstetrics & Gynecology | Admitting: Obstetrics & Gynecology

## 2021-09-15 ENCOUNTER — Other Ambulatory Visit: Payer: Self-pay

## 2021-09-15 DIAGNOSIS — Z01812 Encounter for preprocedural laboratory examination: Secondary | ICD-10-CM | POA: Insufficient documentation

## 2021-09-15 LAB — CBC
HCT: 31.5 % — ABNORMAL LOW (ref 36.0–46.0)
Hemoglobin: 10.4 g/dL — ABNORMAL LOW (ref 12.0–15.0)
MCH: 27.6 pg (ref 26.0–34.0)
MCHC: 33 g/dL (ref 30.0–36.0)
MCV: 83.6 fL (ref 80.0–100.0)
Platelets: 177 10*3/uL (ref 150–400)
RBC: 3.77 MIL/uL — ABNORMAL LOW (ref 3.87–5.11)
RDW: 13.5 % (ref 11.5–15.5)
WBC: 5 10*3/uL (ref 4.0–10.5)
nRBC: 0 % (ref 0.0–0.2)

## 2021-09-16 ENCOUNTER — Ambulatory Visit (INDEPENDENT_AMBULATORY_CARE_PROVIDER_SITE_OTHER): Payer: BC Managed Care – PPO | Admitting: Obstetrics & Gynecology

## 2021-09-16 ENCOUNTER — Encounter: Payer: Self-pay | Admitting: Obstetrics & Gynecology

## 2021-09-16 VITALS — BP 120/80 | Ht 67.0 in | Wt 171.0 lb

## 2021-09-16 DIAGNOSIS — N814 Uterovaginal prolapse, unspecified: Secondary | ICD-10-CM

## 2021-09-16 LAB — TYPE AND SCREEN
ABO/RH(D): O POS
Antibody Screen: NEGATIVE

## 2021-09-16 NOTE — Patient Instructions (Signed)
Total Laparoscopic Hysterectomy, Care After The following information offers guidance on how to care for yourself after your procedure. Your health care provider may also give you more specific instructions. If you have problems or questions, contact your health care provider. What can I expect after the procedure? After the procedure, it is common to have: Pain, bruising, and numbness around your incisions. Tiredness (fatigue). Poor appetite. Less interest in sex. Vaginal discharge or bleeding. You will need to use a sanitary pad after this procedure. Feelings of sadness or other emotions. If your ovaries were also removed, it is also common to have symptoms of menopause, such as hot flashes, night sweats, and lack of sleep (insomnia). Follow these instructions at home: Medicines Take over-the-counter and prescription medicines only as told by your health care provider. Ask your health care provider if the medicine prescribed to you: Requires you to avoid driving or using machinery. Can cause constipation. You may need to take these actions to prevent or treat constipation: Drink enough fluid to keep your urine pale yellow. Take over-the-counter or prescription medicines. Eat foods that are high in fiber, such as beans, whole grains, and fresh fruits and vegetables. Limit foods that are high in fat and processed sugars, such as fried or sweet foods. Incision care  Follow instructions from your health care provider about how to take care of your incisions. Make sure you: Wash your hands with soap and water for at least 20 seconds before and after you change your bandage (dressing). If soap and water are not available, use hand sanitizer. Change your dressing as told by your health care provider. Leave stitches (sutures), skin glue, or adhesive strips in place. These skin closures may need to stay in place for 2 weeks or longer. If adhesive strip edges start to loosen and curl up, you may  trim the loose edges. Do not remove adhesive strips completely unless your health care provider tells you to do that. Check your incision areas every day for signs of infection. Check for: More redness, swelling, or pain. Fluid or blood. Warmth. Pus or a bad smell. Activity  Rest as told by your health care provider. Avoid sitting for a long time without moving. Get up to take short walks every 1-2 hours. This is important to improve blood flow and breathing. Ask for help if you feel weak or unsteady. Return to your normal activities as told by your health care provider. Ask your health care provider what activities are safe for you. Do not lift anything that is heavier than 10 lb (4.5 kg), or the limit that you are told, for one month after surgery or until your health care provider says that it is safe. If you were given a sedative during the procedure, it can affect you for several hours. Do not drive or operate machinery until your health care provider says that it is safe. Lifestyle Do not use any products that contain nicotine or tobacco. These products include cigarettes, chewing tobacco, and vaping devices, such as e-cigarettes. These can delay healing after surgery. If you need help quitting, ask your health care provider. Do not drink alcohol until your health care provider approves. General instructions  Do not douche, use tampons, or have sex for at least 6 weeks, or as told by your health care provider. If you struggle with physical or emotional changes after your procedure, speak with your health care provider or a therapist. Do not take baths, swim, or use a hot tub  until your health care provider approves. You may only be allowed to take showers for 2-3 weeks. Keep your dressing dry until your health care provider says it can be removed. Try to have someone at home with you for the first 1-2 weeks to help with your daily chores. Wear compression stockings as told by your health  care provider. These stockings help to prevent blood clots and reduce swelling in your legs. Keep all follow-up visits. This is important. Contact a health care provider if: You have any of these signs of infection: Chills or a fever. More redness, swelling, or pain around an incision. Fluid or blood coming from an incision. Warmth coming from an incision. Pus or a bad smell coming from an incision. An incision opens. You feel dizzy or light-headed. You have pain or bleeding when you urinate, or you are unable to urinate. You have abnormal vaginal discharge. You have pain that does not get better with medicine. Get help right away if: You have a fever and your symptoms suddenly get worse. You have severe abdominal pain. You have chest pain or shortness of breath. You faint. You have pain, swelling, or redness in your leg. You have heavy vaginal bleeding with blood clots, soaking through a sanitary pad in less than 1 hour. These symptoms may represent a serious problem that is an emergency. Do not wait to see if the symptoms will go away. Get medical help right away. Call your local emergency services (911 in the [place] Do not drive yourself to the hospital. Summary After the procedure, it is common to have pain and bruising around your incisions. Do not take baths, swim, or use a hot tub until your health care provider approves. Do not lift anything that is heavier than 10 lb (4.5 kg), or the limit that you are told, for one month after surgery or until your health care provider says that it is safe. Tell your health care provider if you have any signs or symptoms of infection after the procedure. Get help right away if you have severe abdominal pain, chest pain, shortness of breath, or heavy bleeding from your vagina. This information is not intended to replace advice given to you by your health care provider. Make sure you discuss any questions you have with your health care  provider. Document Revised: 08/02/2020 Document Reviewed: 08/02/2020 Elsevier Patient Education  [place].

## 2021-09-16 NOTE — H&P (View-Only) (Signed)
PRE-OPERATIVE HISTORY AND PHYSICAL EXAM  HPI:  Kari Acosta is a 45 y.o. [id number] No LMP recorded.; she is being admitted for surgery related to pelvic relaxation.   She has vaginal and suprapubic pressure to the point of even pain at times in the lower abdomen (midline) and vagina.  Reg cycles, no issues.  No pain w sex, but does feel the uterus or "something" is in the way.  She has low libido, with unclear reasons why.  Problem started several months ago, considered severe to patient. Symptoms include: prolapse of tissue with straining, discomfort: moderate, and incomplete urinary emptying and urinary frequency.  Symptoms have gradually worsened.    She has h/o 2 prior cesarean sections.  Also, prior BTL.  Prior lap chole.  Denies incontinence.  PMHx: Past Medical History:  Diagnosis Date  . Family history of colon cancer in mother   . Family history of ovarian cancer 08/2021   cancer genetic testing letter sent  . History of kidney stones   . Hypothyroidism   . Renal disorder    kidney stones   Past Surgical History:  Procedure Laterality Date  . CESAREAN SECTION    . CHOLECYSTECTOMY    . GALLBLADDER SURGERY     Family History  Problem Relation Age of Onset  . Colon cancer Mother 7  . Hypertension Mother   . Hyperlipidemia Father   . Hypertension Father   . Stroke Father   . Breast cancer Maternal Grandmother   . Cancer Maternal Grandmother        Gallbladder Cancer  . Lung cancer Maternal Grandmother   . Hypertension Maternal Grandfather   . Heart failure Paternal Grandmother   . Tuberculosis Paternal Grandfather   . Healthy Son   . Healthy Son   . Ovarian cancer Maternal Aunt   . Cancer - Other Maternal Great-grandmother        Gallbladder Cancer   Social History   Tobacco Use  . Smoking status: Never  . Smokeless tobacco: Never  Vaping Use  . Vaping Use: Never used  Substance Use Topics  . Alcohol use: No  . Drug use: Never    Current Outpatient  Medications:  .  levothyroxine (SYNTHROID) 50 MCG tablet, Take 1 tablet (50 mcg total) by mouth daily. (Patient taking differently: Take 50 mcg by mouth daily before breakfast.), Disp: 30 tablet, Rfl: 1 .  linaclotide (LINZESS) 290 MCG CAPS capsule, Take 1 capsule (290 mcg total) by mouth daily before breakfast. Sample, Disp: 30 capsule, Rfl: 3 Allergies: Patient has no known allergies.  Review of Systems  Constitutional:  Negative for chills, fever and malaise/fatigue.  HENT:  Negative for congestion, sinus pain and sore throat.   Eyes:  Negative for blurred vision and pain.  Respiratory:  Negative for cough and wheezing.   Cardiovascular:  Negative for chest pain and leg swelling.  Gastrointestinal:  Negative for abdominal pain, constipation, diarrhea, heartburn, nausea and vomiting.  Genitourinary:  Negative for dysuria, frequency, hematuria and urgency.  Musculoskeletal:  Negative for back pain, joint pain, myalgias and neck pain.  Skin:  Negative for itching and rash.  Neurological:  Negative for dizziness, tremors and weakness.  Endo/Heme/Allergies:  Does not bruise/bleed easily.  Psychiatric/Behavioral:  Negative for depression. The patient is not nervous/anxious and does not have insomnia.    Objective: There were no vitals taken for this visit. There were no vitals filed for this visit. Physical Exam Constitutional:  General: She is not in acute distress.    Appearance: She is well-developed.  HENT:     Head: Normocephalic and atraumatic. No laceration.     Right Ear: Hearing normal.     Left Ear: Hearing normal.     Mouth/Throat:     Pharynx: Uvula midline.  Eyes:     Pupils: Pupils are equal, round, and reactive to light.  Neck:     Thyroid: No thyromegaly.  Cardiovascular:     Rate and Rhythm: Normal rate and regular rhythm.     Heart sounds: No murmur heard.   No friction rub. No gallop.  Pulmonary:     Effort: Pulmonary effort is normal. No respiratory  distress.     Breath sounds: Normal breath sounds. No wheezing.  Abdominal:     General: Bowel sounds are normal. There is no distension.     Palpations: Abdomen is soft.     Tenderness: There is no abdominal tenderness. There is no rebound.  Musculoskeletal:        General: Normal range of motion.     Cervical back: Normal range of motion and neck supple.  Neurological:     Mental Status: She is alert and oriented to person, place, and time.     Cranial Nerves: No cranial nerve deficit.  Skin:    General: Skin is warm and dry.  Psychiatric:        Judgment: Judgment normal.  Vitals reviewed.   Assessment: 1. Uterine prolapse   Plan TLH, BS  I have had a careful discussion with this patient about all the options available and the risk/benefits of each. I have fully informed this patient that surgery may subject her to a variety of discomforts and risks: She understands that most patients have surgery with little difficulty, but problems can happen ranging from minor to fatal. These include nausea, vomiting, pain, bleeding, infection, poor healing, hernia, or formation of adhesions. Unexpected reactions may occur from any drug or anesthetic given. Unintended injury may occur to other pelvic or abdominal structures such as Fallopian tubes, ovaries, bladder, ureter (tube from kidney to bladder), or bowel. Nerves going from the pelvis to the legs may be injured. Any such injury may require immediate or later additional surgery to correct the problem. Excessive blood loss requiring transfusion is very unlikely but possible. Dangerous blood clots may form in the legs or lungs. Physical and sexual activity will be restricted in varying degrees for an indeterminate period of time but most often 2-6 weeks.  Finally, she understands that it is impossible to list every possible undesirable effect and that the condition for which surgery is done is not always cured or significantly improved, and in rare  cases may be even worse.Ample time was given to answer all questions.  Barnett Applebaum, MD, [facility], [facility] 09/16/2021  10:21 AM

## 2021-09-16 NOTE — Progress Notes (Signed)
PRE-OPERATIVE HISTORY AND PHYSICAL EXAM  HPI:  Kari Acosta is a 45 y.o. [id number] No LMP recorded.; she is being admitted for surgery related to pelvic relaxation.   She has vaginal and suprapubic pressure to the point of even pain at times in the lower abdomen (midline) and vagina.  Reg cycles, no issues.  No pain w sex, but does feel the uterus or "something" is in the way.  She has low libido, with unclear reasons why.  Problem started several months ago, considered severe to patient. Symptoms include: prolapse of tissue with straining, discomfort: moderate, and incomplete urinary emptying and urinary frequency.  Symptoms have gradually worsened.    She has h/o 2 prior cesarean sections.  Also, prior BTL.  Prior lap chole.  Denies incontinence.  PMHx: Past Medical History:  Diagnosis Date  . Family history of colon cancer in mother   . Family history of ovarian cancer 08/2021   cancer genetic testing letter sent  . History of kidney stones   . Hypothyroidism   . Renal disorder    kidney stones   Past Surgical History:  Procedure Laterality Date  . CESAREAN SECTION    . CHOLECYSTECTOMY    . GALLBLADDER SURGERY     Family History  Problem Relation Age of Onset  . Colon cancer Mother 63  . Hypertension Mother   . Hyperlipidemia Father   . Hypertension Father   . Stroke Father   . Breast cancer Maternal Grandmother   . Cancer Maternal Grandmother        Gallbladder Cancer  . Lung cancer Maternal Grandmother   . Hypertension Maternal Grandfather   . Heart failure Paternal Grandmother   . Tuberculosis Paternal Grandfather   . Healthy Son   . Healthy Son   . Ovarian cancer Maternal Aunt   . Cancer - Other Maternal Great-grandmother        Gallbladder Cancer   Social History   Tobacco Use  . Smoking status: Never  . Smokeless tobacco: Never  Vaping Use  . Vaping Use: Never used  Substance Use Topics  . Alcohol use: No  . Drug use: Never    Current Outpatient  Medications:  .  levothyroxine (SYNTHROID) 50 MCG tablet, Take 1 tablet (50 mcg total) by mouth daily. (Patient taking differently: Take 50 mcg by mouth daily before breakfast.), Disp: 30 tablet, Rfl: 1 .  linaclotide (LINZESS) 290 MCG CAPS capsule, Take 1 capsule (290 mcg total) by mouth daily before breakfast. Sample, Disp: 30 capsule, Rfl: 3 Allergies: Patient has no known allergies.  Review of Systems  Constitutional:  Negative for chills, fever and malaise/fatigue.  HENT:  Negative for congestion, sinus pain and sore throat.   Eyes:  Negative for blurred vision and pain.  Respiratory:  Negative for cough and wheezing.   Cardiovascular:  Negative for chest pain and leg swelling.  Gastrointestinal:  Negative for abdominal pain, constipation, diarrhea, heartburn, nausea and vomiting.  Genitourinary:  Negative for dysuria, frequency, hematuria and urgency.  Musculoskeletal:  Negative for back pain, joint pain, myalgias and neck pain.  Skin:  Negative for itching and rash.  Neurological:  Negative for dizziness, tremors and weakness.  Endo/Heme/Allergies:  Does not bruise/bleed easily.  Psychiatric/Behavioral:  Negative for depression. The patient is not nervous/anxious and does not have insomnia.    Objective: There were no vitals taken for this visit. There were no vitals filed for this visit. Physical Exam Constitutional:  General: She is not in acute distress.    Appearance: She is well-developed.  HENT:     Head: Normocephalic and atraumatic. No laceration.     Right Ear: Hearing normal.     Left Ear: Hearing normal.     Mouth/Throat:     Pharynx: Uvula midline.  Eyes:     Pupils: Pupils are equal, round, and reactive to light.  Neck:     Thyroid: No thyromegaly.  Cardiovascular:     Rate and Rhythm: Normal rate and regular rhythm.     Heart sounds: No murmur heard.   No friction rub. No gallop.  Pulmonary:     Effort: Pulmonary effort is normal. No respiratory  distress.     Breath sounds: Normal breath sounds. No wheezing.  Abdominal:     General: Bowel sounds are normal. There is no distension.     Palpations: Abdomen is soft.     Tenderness: There is no abdominal tenderness. There is no rebound.  Musculoskeletal:        General: Normal range of motion.     Cervical back: Normal range of motion and neck supple.  Neurological:     Mental Status: She is alert and oriented to person, place, and time.     Cranial Nerves: No cranial nerve deficit.  Skin:    General: Skin is warm and dry.  Psychiatric:        Judgment: Judgment normal.  Vitals reviewed.   Assessment: 1. Uterine prolapse   Plan TLH, BS  I have had a careful discussion with this patient about all the options available and the risk/benefits of each. I have fully informed this patient that surgery may subject her to a variety of discomforts and risks: She understands that most patients have surgery with little difficulty, but problems can happen ranging from minor to fatal. These include nausea, vomiting, pain, bleeding, infection, poor healing, hernia, or formation of adhesions. Unexpected reactions may occur from any drug or anesthetic given. Unintended injury may occur to other pelvic or abdominal structures such as Fallopian tubes, ovaries, bladder, ureter (tube from kidney to bladder), or bowel. Nerves going from the pelvis to the legs may be injured. Any such injury may require immediate or later additional surgery to correct the problem. Excessive blood loss requiring transfusion is very unlikely but possible. Dangerous blood clots may form in the legs or lungs. Physical and sexual activity will be restricted in varying degrees for an indeterminate period of time but most often 2-6 weeks.  Finally, she understands that it is impossible to list every possible undesirable effect and that the condition for which surgery is done is not always cured or significantly improved, and in rare  cases may be even worse.Ample time was given to answer all questions.  Barnett Applebaum, MD, [facility], [facility] 09/16/2021  10:21 AM

## 2021-09-23 ENCOUNTER — Encounter
Admission: RE | Disposition: A | Discharge status: Home / Self Care | Payer: Self-pay | Source: Home / Self Care | Attending: Obstetrics & Gynecology

## 2021-09-23 ENCOUNTER — Ambulatory Visit
Admission: RE | Admit: 2021-09-23 | Discharge: 2021-09-23 | Disposition: A | Discharge status: Home / Self Care | Payer: BC Managed Care – PPO | Attending: Obstetrics & Gynecology | Admitting: Obstetrics & Gynecology

## 2021-09-23 ENCOUNTER — Ambulatory Visit: Payer: BC Managed Care – PPO | Admitting: Anesthesiology

## 2021-09-23 ENCOUNTER — Encounter: Payer: Self-pay | Admitting: Obstetrics & Gynecology

## 2021-09-23 ENCOUNTER — Other Ambulatory Visit: Payer: Self-pay

## 2021-09-23 DIAGNOSIS — Z98891 History of uterine scar from previous surgery: Secondary | ICD-10-CM | POA: Diagnosis not present

## 2021-09-23 DIAGNOSIS — D259 Leiomyoma of uterus, unspecified: Secondary | ICD-10-CM | POA: Insufficient documentation

## 2021-09-23 DIAGNOSIS — N736 Female pelvic peritoneal adhesions (postinfective): Secondary | ICD-10-CM | POA: Insufficient documentation

## 2021-09-23 DIAGNOSIS — N814 Uterovaginal prolapse, unspecified: Secondary | ICD-10-CM | POA: Insufficient documentation

## 2021-09-23 DIAGNOSIS — N812 Incomplete uterovaginal prolapse: Secondary | ICD-10-CM

## 2021-09-23 HISTORY — PX: CYSTOSCOPY: SHX5120

## 2021-09-23 HISTORY — PX: LAPAROSCOPIC SUPRACERVICAL HYSTERECTOMY: SHX5399

## 2021-09-23 HISTORY — PX: LAPAROSCOPIC BILATERAL SALPINGECTOMY: SHX5889

## 2021-09-23 LAB — POCT PREGNANCY, URINE: Preg Test, Ur: NEGATIVE

## 2021-09-23 LAB — ABO/RH: ABO/RH(D): O POS

## 2021-09-23 SURGERY — HYSTERECTOMY, SUPRACERVICAL, LAPAROSCOPIC
Anesthesia: General | Laterality: Bilateral

## 2021-09-23 MED ORDER — CHLORHEXIDINE GLUCONATE 0.12 % MT SOLN: 15.0000 mL | Freq: Once | OROMUCOSAL | Status: AC

## 2021-09-23 MED ORDER — DEXAMETHASONE SODIUM PHOSPHATE 10 MG/ML IJ SOLN
INTRAMUSCULAR | Status: AC
  Filled 2021-09-23: qty 1

## 2021-09-23 MED ORDER — ACETAMINOPHEN 325 MG PO TABS: 650.0000 mg | ORAL_TABLET | ORAL | Status: DC | PRN

## 2021-09-23 MED ORDER — PROPOFOL 10 MG/ML IV BOLUS
INTRAVENOUS | Status: DC | PRN
  Administered 2021-09-23: 150 mg via INTRAVENOUS

## 2021-09-23 MED ORDER — PHENYLEPHRINE HCL (PRESSORS) 10 MG/ML IV SOLN
INTRAVENOUS | Status: AC
  Filled 2021-09-23: qty 2

## 2021-09-23 MED ORDER — KETOROLAC TROMETHAMINE 30 MG/ML IJ SOLN
INTRAMUSCULAR | Status: DC | PRN
  Administered 2021-09-23: 30 mg via INTRAVENOUS

## 2021-09-23 MED ORDER — SODIUM CHLORIDE 0.9 % IV SOLN
2.0000 g | INTRAVENOUS | Status: AC
  Administered 2021-09-23: 2 g via INTRAVENOUS

## 2021-09-23 MED ORDER — ACETAMINOPHEN 10 MG/ML IV SOLN
INTRAVENOUS | Status: DC | PRN
Start: 2021-09-23 — End: 2021-09-23
  Administered 2021-09-23: 1000 mg via INTRAVENOUS

## 2021-09-23 MED ORDER — LIDOCAINE HCL (CARDIAC) PF 100 MG/5ML IV SOSY
PREFILLED_SYRINGE | INTRAVENOUS | Status: DC | PRN
  Administered 2021-09-23: 80 mg via INTRAVENOUS

## 2021-09-23 MED ORDER — OXYCODONE-ACETAMINOPHEN 5-325 MG PO TABS: 1.0000 | ORAL_TABLET | ORAL | 0 refills | Status: DC | PRN

## 2021-09-23 MED ORDER — FENTANYL CITRATE (PF) 100 MCG/2ML IJ SOLN
INTRAMUSCULAR | Status: AC
  Filled 2021-09-23: qty 2

## 2021-09-23 MED ORDER — SUGAMMADEX SODIUM 200 MG/2ML IV SOLN
INTRAVENOUS | Status: DC | PRN
  Administered 2021-09-23: 200 mg via INTRAVENOUS

## 2021-09-23 MED ORDER — BUPIVACAINE HCL (PF) 0.5 % IJ SOLN
INTRAMUSCULAR | Status: AC
  Filled 2021-09-23: qty 30

## 2021-09-23 MED ORDER — MIDAZOLAM HCL 2 MG/2ML IJ SOLN
INTRAMUSCULAR | Status: DC | PRN
  Administered 2021-09-23: 2 mg via INTRAVENOUS

## 2021-09-23 MED ORDER — LACTATED RINGERS IV SOLN: INTRAVENOUS | Status: DC

## 2021-09-23 MED ORDER — OXYCODONE-ACETAMINOPHEN 5-325 MG PO TABS
1.0000 | ORAL_TABLET | ORAL | Status: DC | PRN
Start: 2021-09-23 — End: 2021-09-23

## 2021-09-23 MED ORDER — KETOROLAC TROMETHAMINE 30 MG/ML IJ SOLN
INTRAMUSCULAR | Status: AC
  Filled 2021-09-23: qty 1

## 2021-09-23 MED ORDER — LIDOCAINE HCL (PF) 2 % IJ SOLN
INTRAMUSCULAR | Status: AC
  Filled 2021-09-23: qty 5

## 2021-09-23 MED ORDER — POVIDONE-IODINE 10 % EX SWAB: 2.0000 "application " | Freq: Once | CUTANEOUS | Status: DC

## 2021-09-23 MED ORDER — OXYCODONE HCL 5 MG PO TABS
ORAL_TABLET | ORAL | Status: AC
  Filled 2021-09-23: qty 1

## 2021-09-23 MED ORDER — FAMOTIDINE 20 MG PO TABS: 20.0000 mg | ORAL_TABLET | Freq: Once | ORAL | Status: AC

## 2021-09-23 MED ORDER — MEPERIDINE HCL 25 MG/ML IJ SOLN: 6.2500 mg | INTRAMUSCULAR | Status: DC | PRN

## 2021-09-23 MED ORDER — FAMOTIDINE 20 MG PO TABS
ORAL_TABLET | ORAL | Status: AC
  Administered 2021-09-23: 20 mg via ORAL
  Filled 2021-09-23: qty 1

## 2021-09-23 MED ORDER — FENTANYL CITRATE (PF) 100 MCG/2ML IJ SOLN
INTRAMUSCULAR | Status: DC | PRN
  Administered 2021-09-23 (×2): 50 ug via INTRAVENOUS

## 2021-09-23 MED ORDER — HYDROMORPHONE HCL 1 MG/ML IJ SOLN
INTRAMUSCULAR | Status: AC
  Filled 2021-09-23: qty 1

## 2021-09-23 MED ORDER — OXYCODONE HCL 5 MG/5ML PO SOLN: 5.0000 mg | Freq: Once | ORAL | Status: AC | PRN

## 2021-09-23 MED ORDER — OXYCODONE HCL 5 MG PO TABS
5.0000 mg | ORAL_TABLET | Freq: Once | ORAL | Status: AC | PRN
Start: 2021-09-23 — End: 2021-09-23
  Administered 2021-09-23: 5 mg via ORAL

## 2021-09-23 MED ORDER — FENTANYL CITRATE (PF) 100 MCG/2ML IJ SOLN
25.0000 ug | INTRAMUSCULAR | Status: DC | PRN
  Administered 2021-09-23: 25 ug via INTRAVENOUS

## 2021-09-23 MED ORDER — ACETAMINOPHEN 10 MG/ML IV SOLN
INTRAVENOUS | Status: AC
  Filled 2021-09-23: qty 100

## 2021-09-23 MED ORDER — BUPIVACAINE HCL (PF) 0.5 % IJ SOLN
INTRAMUSCULAR | Status: DC | PRN
  Administered 2021-09-23: 15 mL

## 2021-09-23 MED ORDER — ONDANSETRON HCL 4 MG/2ML IJ SOLN
INTRAMUSCULAR | Status: DC | PRN
  Administered 2021-09-23: 4 mg via INTRAVENOUS

## 2021-09-23 MED ORDER — MIDAZOLAM HCL 2 MG/2ML IJ SOLN
INTRAMUSCULAR | Status: AC
  Filled 2021-09-23: qty 2

## 2021-09-23 MED ORDER — ROCURONIUM BROMIDE 10 MG/ML (PF) SYRINGE
PREFILLED_SYRINGE | INTRAVENOUS | Status: AC
  Filled 2021-09-23: qty 10

## 2021-09-23 MED ORDER — MORPHINE SULFATE (PF) 2 MG/ML IV SOLN: 1.0000 mg | INTRAVENOUS | Status: DC | PRN

## 2021-09-23 MED ORDER — ONDANSETRON HCL 4 MG/2ML IJ SOLN
INTRAMUSCULAR | Status: AC
  Filled 2021-09-23: qty 2

## 2021-09-23 MED ORDER — ROCURONIUM BROMIDE 100 MG/10ML IV SOLN
INTRAVENOUS | Status: DC | PRN
Start: 2021-09-23 — End: 2021-09-23
  Administered 2021-09-23: 10 mg via INTRAVENOUS
  Administered 2021-09-23: 50 mg via INTRAVENOUS

## 2021-09-23 MED ORDER — PROPOFOL 10 MG/ML IV BOLUS
INTRAVENOUS | Status: AC
  Filled 2021-09-23: qty 20

## 2021-09-23 MED ORDER — ACETAMINOPHEN 650 MG RE SUPP
650.0000 mg | RECTAL | Status: DC | PRN
  Filled 2021-09-23: qty 1

## 2021-09-23 MED ORDER — DEXAMETHASONE SODIUM PHOSPHATE 10 MG/ML IJ SOLN
INTRAMUSCULAR | Status: DC | PRN
  Administered 2021-09-23: 10 mg via INTRAVENOUS

## 2021-09-23 MED ORDER — HYDROMORPHONE HCL 1 MG/ML IJ SOLN
INTRAMUSCULAR | Status: DC | PRN
  Administered 2021-09-23: .2 mg via INTRAVENOUS

## 2021-09-23 MED ORDER — CHLORHEXIDINE GLUCONATE 0.12 % MT SOLN
OROMUCOSAL | Status: AC
  Administered 2021-09-23: 15 mL via OROMUCOSAL
  Filled 2021-09-23: qty 15

## 2021-09-23 MED ORDER — PROMETHAZINE HCL 25 MG/ML IJ SOLN: 6.2500 mg | INTRAMUSCULAR | Status: DC | PRN

## 2021-09-23 MED ORDER — SODIUM CHLORIDE 0.9 % IV SOLN
INTRAVENOUS | Status: AC
  Filled 2021-09-23: qty 2

## 2021-09-23 SURGICAL SUPPLY — 65 items
ADH SKN CLS APL DERMABOND .7 (GAUZE/BANDAGES/DRESSINGS) ×2
APL PRP STRL LF DISP 70% ISPRP (MISCELLANEOUS) ×2
APL SRG 38 LTWT LNG FL B (MISCELLANEOUS)
APPLICATOR ARISTA FLEXITIP XL (MISCELLANEOUS) IMPLANT
BAG DRN RND TRDRP ANRFLXCHMBR (UROLOGICAL SUPPLIES) ×2
BAG LAPAROSCOPIC 12 15 PORT 16 (BASKET) ×2 IMPLANT
BAG RETRIEVAL 12/15 (BASKET) ×3
BAG URINE DRAIN 2000ML AR STRL (UROLOGICAL SUPPLIES) ×3 IMPLANT
BLADE SURG SZ11 CARB STEEL (BLADE) ×3 IMPLANT
CATH FOLEY 2WAY  5CC 16FR (CATHETERS) ×1
CATH FOLEY 2WAY 5CC 16FR (CATHETERS) ×2
CATH URTH 16FR FL 2W BLN LF (CATHETERS) ×2 IMPLANT
CHLORAPREP W/TINT 26 (MISCELLANEOUS) ×3 IMPLANT
DEFOGGER SCOPE WARMER CLEARIFY (MISCELLANEOUS) ×3 IMPLANT
DERMABOND ADVANCED (GAUZE/BANDAGES/DRESSINGS) ×1
DERMABOND ADVANCED .7 DNX12 (GAUZE/BANDAGES/DRESSINGS) ×2 IMPLANT
DEVICE SUTURE ENDOST 10MM (ENDOMECHANICALS) IMPLANT
DRAPE CAMERA CLOSED 9X96 (DRAPES) IMPLANT
DRSG TEGADERM 2-3/8X2-3/4 SM (GAUZE/BANDAGES/DRESSINGS) ×9 IMPLANT
GAUZE 4X4 16PLY ~~LOC~~+RFID DBL (SPONGE) ×6 IMPLANT
GLOVE SURG ENC MOIS LTX SZ8 (GLOVE) ×9 IMPLANT
GLOVE SURG UNDER LTX SZ8 (GLOVE) ×9 IMPLANT
GOWN STRL REUS W/ TWL LRG LVL3 (GOWN DISPOSABLE) ×8 IMPLANT
GOWN STRL REUS W/ TWL XL LVL3 (GOWN DISPOSABLE) ×6 IMPLANT
GOWN STRL REUS W/TWL LRG LVL3 (GOWN DISPOSABLE) ×12
GOWN STRL REUS W/TWL XL LVL3 (GOWN DISPOSABLE) ×9
GRASPER SUT TROCAR 14GX15 (MISCELLANEOUS) ×3 IMPLANT
HEMOSTAT ARISTA ABSORB 3G PWDR (HEMOSTASIS) IMPLANT
IRRIGATION STRYKERFLOW (MISCELLANEOUS) ×2 IMPLANT
IRRIGATOR STRYKERFLOW (MISCELLANEOUS) ×3
IV LACTATED RINGERS 1000ML (IV SOLUTION) ×6 IMPLANT
KIT PINK PAD W/HEAD ARE REST (MISCELLANEOUS) ×3
KIT PINK PAD W/HEAD ARM REST (MISCELLANEOUS) ×2 IMPLANT
KIT TURNOVER CYSTO (KITS) ×3 IMPLANT
LABEL OR SOLS (LABEL) ×3 IMPLANT
MANIFOLD NEPTUNE II (INSTRUMENTS) ×3 IMPLANT
MANIPULATOR VCARE LG CRV RETR (MISCELLANEOUS) IMPLANT
MANIPULATOR VCARE SML CRV RETR (MISCELLANEOUS) IMPLANT
MANIPULATOR VCARE STD CRV RETR (MISCELLANEOUS) ×3 IMPLANT
NEEDLE VERESS 14GA 120MM (NEEDLE) ×3 IMPLANT
NS IRRIG 500ML POUR BTL (IV SOLUTION) ×3 IMPLANT
OCCLUDER COLPOPNEUMO (BALLOONS) ×3 IMPLANT
PACK GYN LAPAROSCOPIC (MISCELLANEOUS) ×3 IMPLANT
PAD OB MATERNITY 4.3X12.25 (PERSONAL CARE ITEMS) ×3 IMPLANT
PAD PREP 24X41 OB/GYN DISP (PERSONAL CARE ITEMS) ×3 IMPLANT
SCISSORS METZENBAUM CVD 33 (INSTRUMENTS) IMPLANT
SCRUB EXIDINE 4% CHG 4OZ (MISCELLANEOUS) ×3 IMPLANT
SET CYSTO W/LG BORE CLAMP LF (SET/KITS/TRAYS/PACK) ×3 IMPLANT
SET TRI-LUMEN FLTR TB AIRSEAL (TUBING) ×3 IMPLANT
SHEARS HARMONIC ACE PLUS 36CM (ENDOMECHANICALS) ×3 IMPLANT
SLEEVE ENDOPATH XCEL 5M (ENDOMECHANICALS) ×3 IMPLANT
SPONGE GAUZE 2X2 8PLY STRL LF (GAUZE/BANDAGES/DRESSINGS) ×6 IMPLANT
SUT ENDO VLOC 180-0-8IN (SUTURE) IMPLANT
SUT MNCRL 4-0 (SUTURE) ×6
SUT MNCRL 4-0 27XMFL (SUTURE) ×4
SUT VIC AB 0 CT1 36 (SUTURE) ×3 IMPLANT
SUT VIC AB 4-0 FS2 27 (SUTURE) ×3 IMPLANT
SUT VICRYL 0 AB UR-6 (SUTURE) ×6 IMPLANT
SUTURE MNCRL 4-0 27XMF (SUTURE) ×4 IMPLANT
SYR 10ML LL (SYRINGE) ×3 IMPLANT
SYR 50ML LL SCALE MARK (SYRINGE) ×3 IMPLANT
TROCAR BLADELESS 15MM (ENDOMECHANICALS) ×3 IMPLANT
TROCAR PORT AIRSEAL 8X100 (TROCAR) ×3 IMPLANT
TROCAR XCEL NON-BLD 5MMX100MML (ENDOMECHANICALS) ×3 IMPLANT
WATER STERILE IRR 500ML POUR (IV SOLUTION) IMPLANT

## 2021-09-23 NOTE — Anesthesia Preprocedure Evaluation (Signed)
Anesthesia Evaluation  Patient identified by MRN, date of birth, ID band Patient awake    Reviewed: Allergy & Precautions, NPO status , Patient's Chart, lab work & pertinent test results  History of Anesthesia Complications Negative for: history of anesthetic complications  Airway Mallampati: II  TM Distance: >3 FB Neck ROM: Full    Dental no notable dental hx.    Pulmonary neg pulmonary ROS, neg sleep apnea, neg COPD,    breath sounds clear to auscultation- rhonchi (-) wheezing      Cardiovascular Exercise Tolerance: Good (-) hypertension(-) CAD, (-) Past MI, (-) Cardiac Stents and (-) CABG  Rhythm:Regular Rate:Normal - Systolic murmurs and - Diastolic murmurs    Neuro/Psych neg Seizures negative neurological ROS  negative psych ROS   GI/Hepatic negative GI ROS, Neg liver ROS,   Endo/Other  neg diabetesHypothyroidism   Renal/GU negative Renal ROS     Musculoskeletal negative musculoskeletal ROS (+)   Abdominal (+) - obese,   Peds  Hematology negative hematology ROS (+)   Anesthesia Other Findings Past Medical History: No date: Family history of colon cancer in mother 08/2021: Family history of ovarian cancer     Comment:  cancer genetic testing letter sent No date: History of kidney stones No date: Hypothyroidism No date: Renal disorder     Comment:  kidney stones   Reproductive/Obstetrics                             Anesthesia Physical Anesthesia Plan  ASA: 2  Anesthesia Plan: General   Post-op Pain Management:    Induction: Intravenous  PONV Risk Score and Plan: 2 and Ondansetron, Dexamethasone and Midazolam  Airway Management Planned: Oral ETT  Additional Equipment:   Intra-op Plan:   Post-operative Plan: Extubation in OR  Informed Consent: I have reviewed the patients History and Physical, chart, labs and discussed the procedure including the risks, benefits and  alternatives for the proposed anesthesia with the patient or authorized representative who has indicated his/her understanding and acceptance.     Dental advisory given  Plan Discussed with: CRNA and Anesthesiologist  Anesthesia Plan Comments:         Anesthesia Quick Evaluation

## 2021-09-23 NOTE — Anesthesia Procedure Notes (Signed)
Procedure Name: Intubation Date/Time: 09/23/2021 9:18 AM Performed by: Natasha Mead, CRNA Pre-anesthesia Checklist: Patient identified, Emergency Drugs available, Suction available and Patient being monitored Patient Re-evaluated:Patient Re-evaluated prior to induction Oxygen Delivery Method: Circle system utilized Preoxygenation: Pre-oxygenation with 100% oxygen Induction Type: IV induction Ventilation: Mask ventilation without difficulty Laryngoscope Size: Miller and 2 Grade View: Grade I Tube type: Oral Tube size: 7.0 mm Number of attempts: 1 Airway Equipment and Method: Stylet and Oral airway Placement Confirmation: ETT inserted through vocal cords under direct vision, positive ETCO2 and breath sounds checked- equal and bilateral Secured at: 20 cm Tube secured with: Tape Dental Injury: Teeth and Oropharynx as per pre-operative assessment

## 2021-09-23 NOTE — Discharge Instructions (Signed)

## 2021-09-23 NOTE — Op Note (Signed)
Operative Report:  PRE-OP DIAGNOSIS: Uterine prolapse   POST-OP DIAGNOSIS: Uterine prolapse   PROCEDURE: Procedure(s): LAPAROSCOPIC SUPRACERVICAL HYSTERECTOMY WITH ABLATION OF ENDOMETRIAL CELLS LAPAROSCOPIC BILATERAL SALPINGECTOMY CYSTOSCOPY  SURGEON: Barnett Applebaum, MD, FACOG  ASSISTANT: Dr Georgianne Fick, No other capable assistant available, in surgery requiring high level assistant.  ANESTHESIA: General endotracheal anesthesia  ESTIMATED BLOOD LOSS: less than 50   SPECIMENS: Uterus (without all of cervix), Tubes.  COMPLICATIONS: None  DISPOSITION: stable to PACU  FINDINGS: Intraabdominal adhesions were noted. Significant anterior abdominal wall and bladder to anterior surface of uterus.  PROCEDURE:  The patient was taken to the OR where anesthesia was administed. She was prepped and draped in the normal sterile fashion in the dorsal lithotomy position in the [place] stirrups. A time out was performed. A Graves speculum was inserted, the cervix was grasped with a single tooth tenaculum and the endometrial cavity was sounded. The cervix was progressively dilated to a size 18 [place] with [employer] dilators. A V-Care uterine manipulator was inserted in the usual fashion without incident. Gloves were changed and attention was turned to the abdomen.   An infraumbilical transverse 60mm skin incision was made with the scalpel after local anesthesia applied to the skin. A Veress-step needle was inserted in the usual fashion and confirmed using the hanging drop technique. A pneumoperitoneum was obtained by insufflation of CO2 (opening pressure of 60mmHg) to 49mmHg. A 5 mm trocar is then placed under direct visualization with the laparoscope. A diagnostic laparoscopy was performed yielding the previously described findings. Attention was turned to the left lower quadrant where after visualization of the inferior epigastric vessels a 26mm skin incision was made with the scalpel. A 5 mm laparoscopic port was  inserted. The same procedure was repeated in the right lower quadrant with an 8 mm trocar. Attention was turned to the left aspect of the uterus, where after visualization of the ureter, the round ligament was coagulated and transected using the 48mm Harmonic Scapel. The anterior and posterior leafs of the broad ligament were dissected off as the anterior one was coagulated and transected in a caudal direction towards the cuff of the uterine manipulator.  Attention was then turned to the left fallopian tube which was recognized by visualization of the fimbria. The tube is excised to its attachment to the uterus. The uterine-ovarian ligament and its blood vessels were carefully coagulated and transected using the Harmonic scapel.  Attention was turned to the right aspect of the uterus where the same procedure was performed.  The uterine vessels were coagulated and transected bilaterally using first bipolar cautery and then the harmonic scapel.  The uterus is noted to be significantly adherent to the anterior abdominal wall and inclusive of the bladder.  There is no plane to dissect to be able to separate the lower uterus or cervix from these adhesions.  A decision is made for supracervical hysterectomy due to these concerns.  Once the uterine arteries are confirmed to be coagulated and transected away from the uterus, the cervix is then transected using the Harmonic Scapel, once the endocervical canal is reached the V-Care device is removed, for complete amputation, with approximately 2 cm of cervix remaining.  The endocervical canal is cauterized/ablated with the bipolar cautery device.  Hemostasis assured.  The umbilical port is exchanged to a size 15 mm (extension of fascial incision as well), and then a large endopouch is inserted and the uterus and adnexa is placed in the bag.  The bag is then pulled to  and through the incision. The specimen is then removed through a morcellation technique using a scapel  (this is done exterior to the body at the surface).  The fascia here is then closed with a 0 Vicryl suture.  The cavity was copiously irrigated. A survey of the pelvic cavity revealed adequate hemostasis and no injury to bowel, bladder, or ureter.  The assistance of my assisting-physician was vital to resect and retract interchangably with self on each side.   A diagnostic cystoscopy was performed using saline distension of bladder with no lesions or injuries noted.  Bilateral urine flow from each ureteral orifice is visualized.  At this point the procedure was finalized. The RLQ fascia incision is closed with a vicryl suture using the fascia closure device. All the instruments were removed from the patient's body. Gas was expelled and patient is leveled.  Incisions are closed with 4-0 Vicryl subcuticular closure and then skin adhesive.    Patient goes to recovery room in stable condition.  All sponge, instrument, and needle counts are correct x2.     Barnett Applebaum, MD, [facility], [facility] 09/23/2021  11:32 AM

## 2021-09-23 NOTE — Transfer of Care (Signed)
Immediate Anesthesia Transfer of Care Note  Patient: Kari Acosta  Procedure(s) Performed: LAPAROSCOPIC SUPRACERVICAL HYSTERECTOMY WITH ABLATION OF ENDOMETRIAL CELLS LAPAROSCOPIC BILATERAL SALPINGECTOMY (Bilateral) CYSTOSCOPY  Patient Location: PACU  Anesthesia Type:General  Level of Consciousness: drowsy  Airway & Oxygen Therapy: Patient Spontanous Breathing and Patient connected to face mask oxygen  Post-op Assessment: Report given to RN  Post vital signs: stable  Last Vitals:  Vitals Value Taken Time  BP 126/86 09/23/21 1127  Temp    Pulse 77 09/23/21 1129  Resp 24 09/23/21 1129  SpO2 100 % 09/23/21 1129  Vitals shown include unvalidated device data.  Last Pain:  Vitals:   09/23/21 0823  TempSrc: Temporal  PainSc: 0-No pain         Complications: No notable events documented.

## 2021-09-23 NOTE — Anesthesia Postprocedure Evaluation (Signed)
Anesthesia Post Note  Patient: Kari Acosta  Procedure(s) Performed: LAPAROSCOPIC SUPRACERVICAL HYSTERECTOMY WITH ABLATION OF ENDOMETRIAL CELLS LAPAROSCOPIC BILATERAL SALPINGECTOMY (Bilateral) CYSTOSCOPY  Patient location during evaluation: PACU Anesthesia Type: General Level of consciousness: awake and alert and oriented Pain management: pain level controlled Vital Signs Assessment: post-procedure vital signs reviewed and stable Respiratory status: spontaneous breathing, nonlabored ventilation and respiratory function stable Cardiovascular status: blood pressure returned to baseline and stable Postop Assessment: no signs of nausea or vomiting Anesthetic complications: no   No notable events documented.   Last Vitals:  Vitals:   09/23/21 1200 09/23/21 1209  BP: 130/82   Pulse: 75 81  Resp: 11 13  Temp: 36.7 C   SpO2: 98% 100%    Last Pain:  Vitals:   09/23/21 1209  TempSrc:   PainSc: 7                  Cordarious Zeek

## 2021-09-23 NOTE — Interval H&P Note (Signed)
History and Physical Interval Note:  09/23/2021 8:51 AM  Kari Acosta  has presented today for surgery, with the diagnosis of Uterine prolapse.  The various methods of treatment have been discussed with the patient and family. After consideration of risks, benefits and other options for treatment, the patient has consented to  Procedure(s): TOTAL LAPAROSCOPIC HYSTERECTOMY WITH BILATERAL SALPINGECTOMY, CYSTOSCOPY as a surgical intervention.  The patient's history has been reviewed, patient examined, no change in status, stable for surgery.  I have reviewed the patient's chart and labs.  Questions were answered to the patient's satisfaction.     Hoyt Koch

## 2021-09-24 ENCOUNTER — Encounter: Payer: Self-pay | Admitting: Obstetrics & Gynecology

## 2021-09-24 LAB — SURGICAL PATHOLOGY

## 2021-09-25 ENCOUNTER — Other Ambulatory Visit: Payer: Self-pay | Admitting: Obstetrics & Gynecology

## 2021-09-25 ENCOUNTER — Telehealth: Payer: Self-pay

## 2021-09-25 MED ORDER — HYDROCODONE-ACETAMINOPHEN 7.5-325 MG PO TABS: 1.0000 | ORAL_TABLET | Freq: Four times a day (QID) | ORAL | 0 refills | Status: DC | PRN

## 2021-09-25 NOTE — Telephone Encounter (Signed)
Pt calling; has hyst Tues c PH; is still in a lot of pain; the pain rx isn't helping.  [phone]

## 2021-09-25 NOTE — Telephone Encounter (Signed)
Check on patient.  Offer a Rx for a different pain medicine, if desires. Otherwise, sch work in to assess pain. Pain is expected to some degree after a hysterectomy, especially with the amount of scar tissue she had noted at the time of surgery.  However, it should start to improve soon.

## 2021-09-25 NOTE — Telephone Encounter (Signed)
Pt aware she shoulfd feel better by Monday and if not tomorrow she can be seen. Also aware of the new pain meds sent in.

## 2021-10-14 ENCOUNTER — Encounter: Payer: Self-pay | Admitting: Obstetrics & Gynecology

## 2021-10-14 ENCOUNTER — Other Ambulatory Visit: Payer: Self-pay

## 2021-10-14 ENCOUNTER — Ambulatory Visit (INDEPENDENT_AMBULATORY_CARE_PROVIDER_SITE_OTHER): Payer: BC Managed Care – PPO | Admitting: Obstetrics & Gynecology

## 2021-10-14 VITALS — BP 120/80 | Ht 68.0 in | Wt 170.0 lb

## 2021-10-14 DIAGNOSIS — Z90711 Acquired absence of uterus with remaining cervical stump: Secondary | ICD-10-CM | POA: Insufficient documentation

## 2021-10-14 NOTE — Progress Notes (Signed)
  Postoperative Follow-up Patient presents post op from a [facility], BS for abnormal uterine bleeding and fibroids, 3 weeks ago.  LSH was done due to significant adhesions in the lower uterine and cervical area.  Path: DIAGNOSIS:  A. UTERUS WITHOUT CERVIX, WITH BILATERAL FALLOPIAN TUBES; SUPRACERVICAL  HYSTERECTOMY WITH BILATERAL SALPINGECTOMY:  - ENDOMETRIUM:       - PROLIFERATIVE PHASE ENDOMETRIUM.       - NEGATIVE FOR ATYPICAL HYPERPLASIA/EIN AND MALIGNANCY.  - MYOMETRIUM:       - LEIOMYOMA UTERI.       - NEGATIVE FOR FEATURES OF MALIGNANCY.  - FALLOPIAN TUBES:       - NO SIGNIFICANT HISTOPATHOLOGIC CHANGE.   Subjective: Patient reports some improvement in her preop symptoms.  She still notes pressure in suprapubic area. Eating a regular diet without difficulty. Pain is controlled with current analgesics. Medications being used: ibuprofen (OTC).  Activity: normal activities of daily living. Patient reports additional symptom's since surgery of None.  Objective: BP 120/80   Ht 5\' 8"  (1.727 m)   Wt 170 lb (77.1 kg)   BMI 25.85 kg/m  Physical Exam Constitutional:      General: She is not in acute distress.    Appearance: She is well-developed.  Cardiovascular:     Rate and Rhythm: Normal rate.  Pulmonary:     Effort: Pulmonary effort is normal.  Abdominal:     General: There is no distension.     Palpations: Abdomen is soft.     Tenderness: There is no abdominal tenderness.     Comments: Incision Healing Well   Musculoskeletal:        General: Normal range of motion.  Neurological:     Mental Status: She is alert and oriented to person, place, and time.     Cranial Nerves: No cranial nerve deficit.  Skin:    General: Skin is warm and dry.    Assessment: s/p :   LSH BS  progressing well  Plan: Patient has done well after surgery with no apparent complications.  I have discussed the post-operative course to date, and the expected progress moving forward.  The patient  understands what complications to be concerned about.  I will see the patient in routine follow up, or sooner if needed.    Activity plan: No restriction except Pelvic rest.  Hoyt Koch 10/14/2021, 1:56 PM

## 2021-10-15 NOTE — Progress Notes (Deleted)
Established Patient Office Visit  Subjective:  Patient ID: Kari Acosta, female    DOB: 02-13-1976  Age: 45 y.o. MRN: [id number]  CC: No chief complaint on file.   HPI Kari Acosta presents for follow up on hypothyroidism, constipation, and supracervical hysterectomy.   Past Medical History:  Diagnosis Date   Family history of colon cancer in mother    Family history of ovarian cancer 08/2021   cancer genetic testing letter sent   History of kidney stones    Hypothyroidism    Renal disorder    kidney stones    Past Surgical History:  Procedure Laterality Date   CESAREAN SECTION     CHOLECYSTECTOMY     CYSTOSCOPY  09/23/2021   Procedure: CYSTOSCOPY;  Surgeon: Gae Dry, MD;  Location: [facility] ORS;  Service: Gynecology;;   GALLBLADDER SURGERY     LAPAROSCOPIC BILATERAL SALPINGECTOMY Bilateral 09/23/2021   Procedure: LAPAROSCOPIC BILATERAL SALPINGECTOMY;  Surgeon: Gae Dry, MD;  Location: [facility];  Service: Gynecology;  Laterality: Bilateral;   LAPAROSCOPIC SUPRACERVICAL HYSTERECTOMY  09/23/2021   Procedure: LAPAROSCOPIC SUPRACERVICAL HYSTERECTOMY WITH ABLATION OF ENDOMETRIAL CELLS;  Surgeon: Gae Dry, MD;  Location: [facility];  Service: Gynecology;;    Family History  Problem Relation Age of Onset   Colon cancer Mother 3   Hypertension Mother    Hyperlipidemia Father    Hypertension Father    Stroke Father    Breast cancer Maternal Grandmother    Cancer Maternal Grandmother        Gallbladder Cancer   Lung cancer Maternal Grandmother    Hypertension Maternal Grandfather    Heart failure Paternal Grandmother    Tuberculosis Paternal Grandfather    Healthy Son    Healthy Son    Ovarian cancer Maternal Aunt    Cancer - Other Maternal Great-grandmother        Gallbladder Cancer    Social History   Socioeconomic History   Marital status: Significant Other    Spouse name: Not on file   Number of children: 1   Years of education: Not  on file   Highest education level: Not on file  Occupational History   Not on file  Tobacco Use   Smoking status: Never   Smokeless tobacco: Never  Vaping Use   Vaping Use: Never used  Substance and Sexual Activity   Alcohol use: No   Drug use: Never   Sexual activity: Yes  Other Topics Concern   Not on file  Social History Narrative   Not on file   Social Determinants of Health   Financial Resource Strain: Not on file  Food Insecurity: Not on file  Transportation Needs: Not on file  Physical Activity: Not on file  Stress: Not on file  Social Connections: Not on file  Intimate Partner Violence: Not on file    Outpatient Medications Prior to Visit  Medication Sig Dispense Refill   HYDROcodone-acetaminophen (NORCO) 7.5-325 MG tablet Take 1 tablet by mouth every 6 (six) hours as needed for moderate pain. (Patient not taking: Reported on 10/14/2021) 30 tablet 0   levothyroxine (SYNTHROID) 50 MCG tablet Take 1 tablet (50 mcg total) by mouth daily. (Patient taking differently: Take 50 mcg by mouth daily before breakfast.) 30 tablet 1   linaclotide (LINZESS) 290 MCG CAPS capsule Take 1 capsule (290 mcg total) by mouth daily before breakfast. Sample 30 capsule 3   No facility-administered medications prior to visit.    No Known  Allergies  ROS Review of Systems    Objective:    Physical Exam  There were no vitals taken for this visit. Wt Readings from Last 3 Encounters:  10/14/21 170 lb (77.1 kg)  09/23/21 170 lb (77.1 kg)  09/16/21 171 lb (77.6 kg)     Health Maintenance Due  Topic Date Due   COVID-19 Vaccine (1) Never done   INFLUENZA VACCINE  Never done    There are no preventive care reminders to display for this patient.  Lab Results  Component Value Date   TSH 6.260 (H) 08/29/2021   Lab Results  Component Value Date   WBC 5.0 09/15/2021   HGB 10.4 (L) 09/15/2021   HCT 31.5 (L) 09/15/2021   MCV 83.6 09/15/2021   PLT 177 09/15/2021   Lab Results   Component Value Date   NA 138 06/19/2021   K 3.6 06/19/2021   CO2 23 06/19/2021   GLUCOSE 92 06/19/2021   BUN 9 06/19/2021   CREATININE 0.76 06/19/2021   BILITOT 0.5 06/19/2021   ALKPHOS 40 (L) 06/19/2021   AST 17 06/19/2021   ALT 11 06/19/2021   PROT 7.0 06/19/2021   ALBUMIN 4.6 06/19/2021   CALCIUM 9.3 06/19/2021   ANIONGAP 7 09/12/2018   EGFR 100 06/19/2021   Lab Results  Component Value Date   CHOL 160 06/19/2021   Lab Results  Component Value Date   HDL 79 06/19/2021   Lab Results  Component Value Date   LDLCALC 71 06/19/2021   Lab Results  Component Value Date   TRIG 48 06/19/2021   No results found for: CHOLHDL No results found for: HGBA1C    Assessment & Plan:   Problem List Items Addressed This Visit   None   No orders of the defined types were placed in this encounter.   Follow-up: No follow-ups on file.    Charyl Dancer, NP

## 2021-10-17 ENCOUNTER — Ambulatory Visit: Payer: BC Managed Care – PPO | Admitting: Nurse Practitioner

## 2021-10-17 DIAGNOSIS — Z90711 Acquired absence of uterus with remaining cervical stump: Secondary | ICD-10-CM

## 2021-10-17 DIAGNOSIS — E039 Hypothyroidism, unspecified: Secondary | ICD-10-CM

## 2021-10-17 DIAGNOSIS — K5904 Chronic idiopathic constipation: Secondary | ICD-10-CM

## 2021-10-20 NOTE — Progress Notes (Signed)
Established Patient Office Visit  Subjective:  Patient ID: Kari Acosta, female    DOB: June 08, 1976  Age: 45 y.o. MRN: [id number]  CC:  Chief Complaint  Patient presents with   Hypothyroidism     HPI Kari Acosta presents for follow up on hypothyroid and post hysterectomy.   She saw GYN for prolapsed uterus and had a supracervical hysterectomy on 09/23/21. She states that she is still having the symptoms of lower abdominal pressure that gets worse when she stands. She feels like she has to urinate frequently but doesn't urinate much volume when going to the bathroom. The pressure has significantly worsened over the last week and she has an appointment with GYN tomorrow. She has taken aleve which helps with the pressure. After her surgery, she was in a lot of pain and didn't take any of her medications for 1.5 weeks.   Past Medical History:  Diagnosis Date   Family history of colon cancer in mother    Family history of ovarian cancer 08/2021   cancer genetic testing letter sent   History of kidney stones    Hypothyroidism    Renal disorder    kidney stones    Past Surgical History:  Procedure Laterality Date   CESAREAN SECTION     CHOLECYSTECTOMY     CYSTOSCOPY  09/23/2021   Procedure: CYSTOSCOPY;  Surgeon: Gae Dry, MD;  Location: [facility];  Service: Gynecology;;   GALLBLADDER SURGERY     LAPAROSCOPIC BILATERAL SALPINGECTOMY Bilateral 09/23/2021   Procedure: LAPAROSCOPIC BILATERAL SALPINGECTOMY;  Surgeon: Gae Dry, MD;  Location: [facility];  Service: Gynecology;  Laterality: Bilateral;   LAPAROSCOPIC SUPRACERVICAL HYSTERECTOMY  09/23/2021   Procedure: LAPAROSCOPIC SUPRACERVICAL HYSTERECTOMY WITH ABLATION OF ENDOMETRIAL CELLS;  Surgeon: Gae Dry, MD;  Location: [facility];  Service: Gynecology;;    Family History  Problem Relation Age of Onset   Colon cancer Mother 51   Hypertension Mother    Hyperlipidemia Father    Hypertension Father     Stroke Father    Breast cancer Maternal Grandmother    Cancer Maternal Grandmother        Gallbladder Cancer   Lung cancer Maternal Grandmother    Hypertension Maternal Grandfather    Heart failure Paternal Grandmother    Tuberculosis Paternal Grandfather    Healthy Son    Healthy Son    Ovarian cancer Maternal Aunt    Cancer - Other Maternal Great-grandmother        Gallbladder Cancer    Social History   Socioeconomic History   Marital status: Significant Other    Spouse name: Not on file   Number of children: 1   Years of education: Not on file   Highest education level: Not on file  Occupational History   Not on file  Tobacco Use   Smoking status: Never   Smokeless tobacco: Never  Vaping Use   Vaping Use: Never used  Substance and Sexual Activity   Alcohol use: No   Drug use: Never   Sexual activity: Yes  Other Topics Concern   Not on file  Social History Narrative   Not on file   Social Determinants of Health   Financial Resource Strain: Not on file  Food Insecurity: Not on file  Transportation Needs: Not on file  Physical Activity: Not on file  Stress: Not on file  Social Connections: Not on file  Intimate Partner Violence: Not on file    Outpatient Medications  Prior to Visit  Medication Sig Dispense Refill   levothyroxine (SYNTHROID) 50 MCG tablet Take 1 tablet (50 mcg total) by mouth daily. (Patient taking differently: Take 50 mcg by mouth daily before breakfast.) 30 tablet 1   linaclotide (LINZESS) 290 MCG CAPS capsule Take 1 capsule (290 mcg total) by mouth daily before breakfast. Sample 30 capsule 3   HYDROcodone-acetaminophen (NORCO) 7.5-325 MG tablet Take 1 tablet by mouth every 6 (six) hours as needed for moderate pain. (Patient not taking: Reported on 10/14/2021) 30 tablet 0   No facility-administered medications prior to visit.    No Known Allergies  ROS Review of Systems  Constitutional:  Positive for fatigue.  Respiratory: Negative.     Cardiovascular: Negative.   Gastrointestinal:  Positive for abdominal pain (pressure in lower abdomen).  Genitourinary:  Positive for difficulty urinating. Negative for dysuria.  Skin: Negative.   Neurological: Negative.      Objective:    Physical Exam Vitals and nursing note reviewed.  Constitutional:      General: She is not in acute distress.    Appearance: Normal appearance.  HENT:     Head: Normocephalic and atraumatic.  Eyes:     Conjunctiva/sclera: Conjunctivae normal.  Cardiovascular:     Rate and Rhythm: Normal rate and regular rhythm.     Pulses: Normal pulses.     Heart sounds: Normal heart sounds.  Pulmonary:     Effort: Pulmonary effort is normal.     Breath sounds: Normal breath sounds.  Musculoskeletal:     Cervical back: Normal range of motion.  Skin:    General: Skin is warm and dry.  Neurological:     General: No focal deficit present.     Mental Status: She is alert and oriented to person, place, and time.  Psychiatric:        Mood and Affect: Mood normal.        Behavior: Behavior normal.        Thought Content: Thought content normal.        Judgment: Judgment normal.    BP 127/82   Pulse 80   Temp 98.2 F (36.8 C) (Oral)   Ht 5' 8"  (1.727 m)   Wt 172 lb 12.8 oz (78.4 kg)   LMP 09/22/2021 (Approximate)   SpO2 100%   BMI 26.27 kg/m  Wt Readings from Last 3 Encounters:  10/21/21 172 lb 12.8 oz (78.4 kg)  10/14/21 170 lb (77.1 kg)  09/23/21 170 lb (77.1 kg)     There are no preventive care reminders to display for this patient.   There are no preventive care reminders to display for this patient.  Lab Results  Component Value Date   TSH 6.260 (H) 08/29/2021   Lab Results  Component Value Date   WBC 5.0 09/15/2021   HGB 10.4 (L) 09/15/2021   HCT 31.5 (L) 09/15/2021   MCV 83.6 09/15/2021   PLT 177 09/15/2021   Lab Results  Component Value Date   NA 138 06/19/2021   K 3.6 06/19/2021   CO2 23 06/19/2021   GLUCOSE 92  06/19/2021   BUN 9 06/19/2021   CREATININE 0.76 06/19/2021   BILITOT 0.5 06/19/2021   ALKPHOS 40 (L) 06/19/2021   AST 17 06/19/2021   ALT 11 06/19/2021   PROT 7.0 06/19/2021   ALBUMIN 4.6 06/19/2021   CALCIUM 9.3 06/19/2021   ANIONGAP 7 09/12/2018   EGFR 100 06/19/2021   Lab Results  Component Value Date  CHOL 160 06/19/2021   Lab Results  Component Value Date   HDL 79 06/19/2021   Lab Results  Component Value Date   LDLCALC 71 06/19/2021   Lab Results  Component Value Date   TRIG 48 06/19/2021   No results found for: CHOLHDL No results found for: HGBA1C    Assessment & Plan:   Problem List Items Addressed This Visit       Endocrine   Hypothyroidism - Primary    Recently increased levothyroxine to 38mg daily. She has not taken her medications for the last 1.5 weeks. Will schedule a future visit in 6-8 weeks for a TSH as she has started taking her medications again.       Relevant Orders   TSH     Other   S/P laparoscopic supracervical hysterectomy    Incisions healing well. She has an appointment with GYN tomorrow for ongoing and worsening pressure. No internal exam done today as she recently had surgery. Discussed limiting aleve and can try tylenol to help with the pressure.        No orders of the defined types were placed in this encounter.   Follow-up: Return in about 3 months (around 01/21/2022) for Abdominal pressure, f/u from GYN.    LCharyl Dancer NP

## 2021-10-21 ENCOUNTER — Other Ambulatory Visit: Payer: Self-pay

## 2021-10-21 ENCOUNTER — Ambulatory Visit (INDEPENDENT_AMBULATORY_CARE_PROVIDER_SITE_OTHER): Payer: BC Managed Care – PPO | Admitting: Nurse Practitioner

## 2021-10-21 ENCOUNTER — Encounter: Payer: Self-pay | Admitting: Nurse Practitioner

## 2021-10-21 VITALS — BP 127/82 | HR 80 | Temp 98.2°F | Ht 68.0 in | Wt 172.8 lb

## 2021-10-21 DIAGNOSIS — E039 Hypothyroidism, unspecified: Secondary | ICD-10-CM | POA: Diagnosis not present

## 2021-10-21 DIAGNOSIS — Z90711 Acquired absence of uterus with remaining cervical stump: Secondary | ICD-10-CM

## 2021-10-21 NOTE — Assessment & Plan Note (Addendum)
Recently increased levothyroxine to 31mcg daily. She has not taken her medications for the last 1.5 weeks. Will schedule a future visit in 6-8 weeks for a TSH as she has started taking her medications again.

## 2021-10-21 NOTE — Assessment & Plan Note (Addendum)
Incisions healing well. She has an appointment with GYN tomorrow for ongoing and worsening pressure. No internal exam done today as she recently had surgery. Discussed limiting aleve and can try tylenol to help with the pressure.

## 2021-10-22 ENCOUNTER — Encounter: Payer: Self-pay | Admitting: Obstetrics & Gynecology

## 2021-10-22 ENCOUNTER — Ambulatory Visit (INDEPENDENT_AMBULATORY_CARE_PROVIDER_SITE_OTHER): Payer: BC Managed Care – PPO | Admitting: Obstetrics & Gynecology

## 2021-10-22 VITALS — BP 120/80 | Ht 68.0 in | Wt 172.0 lb

## 2021-10-22 DIAGNOSIS — R102 Pelvic and perineal pain: Secondary | ICD-10-CM

## 2021-10-22 DIAGNOSIS — Z90711 Acquired absence of uterus with remaining cervical stump: Secondary | ICD-10-CM

## 2021-10-22 MED ORDER — GABAPENTIN 100 MG PO CAPS: 200.0000 mg | ORAL_CAPSULE | Freq: Three times a day (TID) | ORAL | 5 refills | Status: DC

## 2021-10-22 NOTE — Patient Instructions (Signed)
Gabapentin Capsules or Tablets ?What is this medication? ?GABAPENTIN (GA ba pen tin) treats nerve pain. It may also be used to prevent and control seizures in people with epilepsy. It works by calming overactive nerves in your body. ?This medicine may be used for other purposes; ask your health care provider or pharmacist if you have questions. ?COMMON BRAND NAME(S): Active-PAC with Gabapentin, Gabarone, Gralise, Neurontin ?What should I tell my care team before I take this medication? ?They need to know if you have any of these conditions: ?Alcohol or substance use disorder ?Kidney disease ?Lung or breathing disease ?Suicidal thoughts, plans, or attempt; a previous suicide attempt by you or a family member ?An unusual or allergic reaction to gabapentin, other medications, foods, dyes, or preservatives ?Pregnant or trying to get pregnant ?Breast-feeding ?How should I use this medication? ?Take this medication by mouth with a glass of water. Follow the directions on the prescription label. You can take it with or without food. If it upsets your stomach, take it with food. Take your medication at regular intervals. Do not take it more often than directed. Do not stop taking except on your care team's advice. ?If you are directed to break the 600 or 800 mg tablets in half as part of your dose, the extra half tablet should be used for the next dose. If you have not used the extra half tablet within 28 days, it should be thrown away. ?A special MedGuide will be given to you by the pharmacist with each prescription and refill. Be sure to read this information carefully each time. ?Talk to your care team about the use of this medication in children. While this medication may be prescribed for children as young as 3 years for selected conditions, precautions do apply. ?Overdosage: If you think you have taken too much of this medicine contact a poison control center or emergency room at once. ?NOTE: This medicine is only for  you. Do not share this medicine with others. ?What if I miss a dose? ?If you miss a dose, take it as soon as you can. If it is almost time for your next dose, take only that dose. Do not take double or extra doses. ?What may interact with this medication? ?Alcohol ?Antihistamines for allergy, cough, and cold ?Certain medications for anxiety or sleep ?Certain medications for depression like amitriptyline, fluoxetine, sertraline ?Certain medications for seizures like phenobarbital, primidone ?Certain medications for stomach problems ?General anesthetics like halothane, isoflurane, methoxyflurane, propofol ?Local anesthetics like lidocaine, pramoxine, tetracaine ?Medications that relax muscles for surgery ?Opioid medications for pain ?Phenothiazines like chlorpromazine, mesoridazine, prochlorperazine, thioridazine ?This list may not describe all possible interactions. Give your health care provider a list of all the medicines, herbs, non-prescription drugs, or dietary supplements you use. Also tell them if you smoke, drink alcohol, or use illegal drugs. Some items may interact with your medicine. ?What should I watch for while using this medication? ?Visit your care team for regular checks on your progress. You may want to keep a record at home of how you feel your condition is responding to treatment. You may want to share this information with your care team at each visit. You should contact your care team if your seizures get worse or if you have any new types of seizures. Do not stop taking this medication or any of your seizure medications unless instructed by your care team. Stopping your medication suddenly can increase your seizures or their severity. ?This medication may cause serious skin   reactions. They can happen weeks to months after starting the medication. Contact your care team right away if you notice fevers or flu-like symptoms with a rash. The rash may be red or purple and then turn into blisters or  peeling of the skin. Or, you might notice a red rash with swelling of the face, lips or lymph nodes in your neck or under your arms. ?Wear a medical identification bracelet or chain if you are taking this medication for seizures. Carry a card that lists all your medications. ?This medication may affect your coordination, reaction time, or judgment. Do not drive or operate machinery until you know how this medication affects you. Sit up or stand slowly to reduce the risk of dizzy or fainting spells. Drinking alcohol with this medication can increase the risk of these side effects. ?Your mouth may get dry. Chewing sugarless gum or sucking hard candy, and drinking plenty of water may help. ?Watch for new or worsening thoughts of suicide or depression. This includes sudden changes in mood, behaviors, or thoughts. These changes can happen at any time but are more common in the beginning of treatment or after a change in dose. Call your care team right away if you experience these thoughts or worsening depression. ?If you become pregnant while using this medication, you may enroll in the [place] Antiepileptic Drug Pregnancy Registry by calling 1-888-233-2334. This registry collects information about the safety of antiepileptic medication use during pregnancy. ?What side effects may I notice from receiving this medication? ?Side effects that you should report to your care team as soon as possible: ?Allergic reactions or angioedema--skin rash, itching, hives, swelling of the face, eyes, lips, tongue, arms, or legs, trouble swallowing or breathing ?Rash, fever, and swollen lymph nodes ?Thoughts of suicide or self harm, worsening mood, feelings of depression ?Trouble breathing ?Unusual changes in mood or behavior in children after use such as difficulty concentrating, hostility, or restlessness ?Side effects that usually do not require medical attention (report to your care team if they continue or are  bothersome): ?Dizziness ?Drowsiness ?Nausea ?Swelling of ankles, feet, or hands ?Vomiting ?This list may not describe all possible side effects. Call your doctor for medical advice about side effects. You may report side effects to FDA at 1-800-FDA-1088. ?Where should I keep my medication? ?Keep out of reach of children and pets. ?Store at room temperature between 15 and 30 degrees C (59 and 86 degrees F). Get rid of any unused medication after the expiration date. ?This medication may cause accidental overdose and death if taken by other adults, children, or pets. ?To get rid of medications that are no longer needed or have expired: ?Take the medication to a medication take-back program. Check with your pharmacy or law enforcement to find a location. ?If you cannot return the medication, check the label or package insert to see if the medication should be thrown out in the garbage or flushed down the toilet. If you are not sure, ask your care team. If it is safe to put it in the trash, empty the medication out of the container. Mix the medication with cat litter, dirt, coffee grounds, or other unwanted substance. Seal the mixture in a bag or container. Put it in the trash. ?NOTE: This sheet is a summary. It may not cover all possible information. If you have questions about this medicine, talk to your doctor, pharmacist, or health care provider. ?? 2022 Elsevier/Gold Standard (2021-06-02 00:00:00) ? ?

## 2021-10-22 NOTE — Progress Notes (Signed)
  Postoperative Follow-up Patient presents post op from  [facility] BS  for pelvic pain and pelvic relaxation,  5 weeks  ago.  Subjective: Patient reports some improvement in her preop symptoms. More pressure than pain but present mid-lower abdomen, as before surgery to a degree.  Eating a regular diet without difficulty. Pain is controlled without any medications.  Activity: normal activities of daily living. Patient reports additional symptom's since surgery of None.  Objective: BP 120/80   Ht 5\' 8"  (1.727 m)   Wt 172 lb (78 kg)   LMP 09/22/2021 (Approximate)   BMI 26.15 kg/m  Physical Exam Constitutional:      General: She is not in acute distress.    Appearance: She is well-developed.  Genitourinary:     Rectum normal.     No vaginal erythema or bleeding.      Right Adnexa: not tender and no mass present.    Left Adnexa: not tender and no mass present.    No cervical motion tenderness, discharge or lesion.     Uterus is not enlarged or tender.     No uterine mass detected. Cardiovascular:     Rate and Rhythm: Normal rate.  Pulmonary:     Effort: Pulmonary effort is normal.  Abdominal:     General: There is no distension.     Palpations: Abdomen is soft.     Tenderness: There is no abdominal tenderness.     Comments: Incision healing well.  Musculoskeletal:        General: Normal range of motion.  Neurological:     Mental Status: She is alert and oriented to person, place, and time.     Cranial Nerves: No cranial nerve deficit.  Skin:    General: Skin is warm and dry.    Assessment: s/p :   LSH BS  progressing well and still having lower abdominal pain and pressure  Plan: Patient has done well after surgery with no apparent complications.  I have discussed the post-operative course to date, and the expected progress moving forward.  The patient understands what complications to be concerned about.  I will see the patient in routine follow up, or sooner if needed.     Activity plan: No restriction. Gabapentin for prolonged pain.  She had adhesions and cervix left behind.  Nerve pain should heal w time but this may take a while.  Hopefully Gabapentin can help during this transition. Anticipate 4-6 mos of use.   Hoyt Koch 10/22/2021, 3:06 PM

## 2021-11-05 ENCOUNTER — Ambulatory Visit: Payer: BC Managed Care – PPO | Admitting: Obstetrics & Gynecology

## 2021-11-26 ENCOUNTER — Ambulatory Visit: Payer: BC Managed Care – PPO | Admitting: Obstetrics & Gynecology

## 2021-12-02 ENCOUNTER — Other Ambulatory Visit: Payer: Self-pay

## 2021-12-02 ENCOUNTER — Other Ambulatory Visit: Payer: BC Managed Care – PPO

## 2021-12-02 DIAGNOSIS — E039 Hypothyroidism, unspecified: Secondary | ICD-10-CM

## 2021-12-03 LAB — TSH: TSH: 4.12 u[IU]/mL (ref 0.450–4.500)

## 2021-12-05 ENCOUNTER — Encounter: Payer: Self-pay | Admitting: Gastroenterology

## 2021-12-05 ENCOUNTER — Encounter: Payer: Self-pay | Admitting: Obstetrics & Gynecology

## 2021-12-05 ENCOUNTER — Ambulatory Visit (INDEPENDENT_AMBULATORY_CARE_PROVIDER_SITE_OTHER): Payer: BC Managed Care – PPO | Admitting: Obstetrics & Gynecology

## 2021-12-05 DIAGNOSIS — R102 Pelvic and perineal pain: Secondary | ICD-10-CM | POA: Diagnosis not present

## 2021-12-05 NOTE — Progress Notes (Signed)
Virtual Visit via Telephone Note  I connected with Kari Acosta on 12/05/21 at  2:30 PM EST by telephone and verified that I am speaking with the correct person using two identifiers.  Location: Patient: Home Provider: Office   I discussed the limitations, risks, security and privacy concerns of performing an evaluation and management service by telephone and the availability of in person appointments. I also discussed with the patient that there may be a patient responsible charge related to this service. The patient expressed understanding and agreed to proceed.   History of Present Illness:  Kari Acosta is a 45 y.o. who was started on Gabapentin approximately 6 weeks ago for neuropathic pain an abdomen after [facility] surgery. Since that time, she states that her symptoms are improving.  She herself has slowed down on frequency of taking this medicine.  PMHx: She  has a past medical history of Family history of colon cancer in mother, Family history of ovarian cancer (08/2021), History of kidney stones, Hypothyroidism, and Renal disorder. Also,  has a past surgical history that includes Cesarean section; Gallbladder surgery; Cholecystectomy; Laparoscopic supracervical hysterectomy (09/23/2021); Laparoscopic bilateral salpingectomy (Bilateral, 09/23/2021); and Cystoscopy (09/23/2021)., family history includes Breast cancer in her maternal grandmother; Cancer in her maternal grandmother; Cancer - Other in her maternal great-grandmother; Colon cancer (age of onset: 47) in her mother; Healthy in her son and son; Heart failure in her paternal grandmother; Hyperlipidemia in her father; Hypertension in her father, maternal grandfather, and mother; Lung cancer in her maternal grandmother; Ovarian cancer in her maternal aunt; Stroke in her father; Tuberculosis in her paternal grandfather.,  reports that she has never smoked. She has never used smokeless tobacco. She reports that she does not drink  alcohol and does not use drugs. No outpatient medications have been marked as taking for the 12/05/21 encounter (Office Visit) with Gae Dry, MD.  . Also, has No Known Allergies..  Review of Systems  All other systems reviewed and are negative.  Observations/Objective: No exam today, due to telephone eVisit due to [facility] virus restriction on elective visits and procedures.  Prior visits reviewed along with ultrasounds/labs as indicated.   Assessment and Plan:   ICD-10-CM   1. Pelvic pain  R10.2     Resolving Stop Gabapentin Resume all normal activities  Follow Up Instructions: PRN/Annual   I discussed the assessment and treatment plan with the patient. The patient was provided an opportunity to ask questions and all were answered. The patient agreed with the plan and demonstrated an understanding of the instructions.   The patient was advised to call back or seek an in-person evaluation if the symptoms worsen or if the condition fails to improve as anticipated.  I provided 5 minutes of non-face-to-face time during this encounter. Also 5 minutes of review of records.  Hoyt Koch, MD

## 2021-12-09 ENCOUNTER — Ambulatory Visit
Admission: RE | Admit: 2021-12-09 | Payer: BC Managed Care – PPO | Source: Home / Self Care | Admitting: Gastroenterology

## 2021-12-09 ENCOUNTER — Encounter: Admission: RE | Payer: Self-pay | Source: Home / Self Care

## 2021-12-09 ENCOUNTER — Telehealth: Payer: Self-pay | Admitting: Gastroenterology

## 2021-12-09 SURGERY — COLONOSCOPY WITH PROPOFOL
Anesthesia: General

## 2021-12-09 NOTE — Telephone Encounter (Signed)
Inbound call from pt requesting a call back to r/s her procedure. Thank you. °

## 2021-12-10 ENCOUNTER — Other Ambulatory Visit: Payer: Self-pay | Admitting: Nurse Practitioner

## 2021-12-10 NOTE — Telephone Encounter (Signed)
Requested Prescriptions  Pending Prescriptions Disp Refills   levothyroxine (SYNTHROID) 50 MCG tablet [Pharmacy Med Name: LEVOTHYROXINE 50 MCG TABLET] 30 tablet 1    Sig: TAKE 1 TABLET BY MOUTH EVERY DAY     Endocrinology:  Hypothyroid Agents Failed - 12/10/2021  1:26 AM      Failed - TSH needs to be rechecked within 3 months after an abnormal result. Refill until TSH is due.      Passed - TSH in normal range and within 360 days    TSH  Date Value Ref Range Status  12/02/2021 4.120 0.450 - 4.500 uIU/mL Final         Passed - Valid encounter within last 12 months    Recent Outpatient Visits          1 month ago Hypothyroidism, unspecified type   [place], NP   4 months ago Screening for cervical cancer   [place], Lauren A, NP   5 months ago Routine general medical examination at a health care facility   [facility] Charyl Dancer, NP      Future Appointments            In 1 month Jon Billings, [place], [place]   In 7 months Jon Billings, NP [facility], [place]

## 2021-12-11 ENCOUNTER — Other Ambulatory Visit: Payer: Self-pay | Admitting: Family Medicine

## 2021-12-11 NOTE — Telephone Encounter (Signed)
Requested Prescriptions  Pending Prescriptions Disp Refills   levothyroxine (SYNTHROID) 50 MCG tablet [Pharmacy Med Name: LEVOTHYROXINE 50 MCG TABLET] 30 tablet 1    Sig: TAKE 1 TABLET BY MOUTH EVERY DAY     Endocrinology:  Hypothyroid Agents Failed - 12/11/2021  7:48 AM      Failed - TSH needs to be rechecked within 3 months after an abnormal result. Refill until TSH is due.      Passed - TSH in normal range and within 360 days    TSH  Date Value Ref Range Status  12/02/2021 4.120 0.450 - 4.500 uIU/mL Final         Passed - Valid encounter within last 12 months    Recent Outpatient Visits          1 month ago Hypothyroidism, unspecified type   [place], NP   4 months ago Screening for cervical cancer   [place], Lauren A, NP   5 months ago Routine general medical examination at a health care facility   [facility] Charyl Dancer, NP      Future Appointments            In 1 month Jon Billings, [place], [place]   In 7 months Jon Billings, NP [facility], [place]

## 2022-01-04 ENCOUNTER — Other Ambulatory Visit: Payer: Self-pay | Admitting: Family Medicine

## 2022-01-05 NOTE — Telephone Encounter (Signed)
Requested medication (s) are due for refill today:   Yes  Requested medication (s) are on the active medication list:   Yes  Future visit scheduled:   Yes   Last ordered: 12/10/2021 #30, 1 refill  Returned because pharmacy requesting a 90 day supply due to insurance requirements.  Requesting new rx  Requested Prescriptions  Pending Prescriptions Disp Refills   levothyroxine (SYNTHROID) 50 MCG tablet [Pharmacy Med Name: LEVOTHYROXINE 50 MCG TABLET] 30 tablet 1    Sig: TAKE 1 TABLET BY MOUTH EVERY DAY     Endocrinology:  Hypothyroid Agents Failed - 01/04/2022  7:06 PM      Failed - TSH needs to be rechecked within 3 months after an abnormal result. Refill until TSH is due.      Passed - TSH in normal range and within 360 days    TSH  Date Value Ref Range Status  12/02/2021 4.120 0.450 - 4.500 uIU/mL Final          Passed - Valid encounter within last 12 months    Recent Outpatient Visits           2 months ago Hypothyroidism, unspecified type   [place], Lauren A, NP   5 months ago Screening for cervical cancer   [facility] [place], Lauren A, NP   6 months ago Routine general medical examination at a health care facility   [facility] Charyl Dancer, NP       Future Appointments             In 2 weeks Jon Billings, NP [facility], [place]   In 6 months Jon Billings, NP [facility], [place]

## 2022-01-05 NOTE — Telephone Encounter (Signed)
Referral Request - Has patient seen PCP for this complaint? yes *If NO, is insurance requiring patient see PCP for this issue before PCP can refer them? Referral for which specialty:colonoscopy Preferred provider/office: [facility] Reason for referral:problem with constipation Patient called back needs new, referral she was unable to go to previous appt for referral because of hysterectomy she had.

## 2022-01-06 NOTE — Telephone Encounter (Signed)
Secure chatted with Ginger, CMA at [facility]. She states that she was reactivating the old referral so the patient could call and reschedule.   Called and notified patient of the above so she can call and reschedule her appointment.

## 2022-01-07 ENCOUNTER — Other Ambulatory Visit: Payer: Self-pay

## 2022-01-07 DIAGNOSIS — Z1211 Encounter for screening for malignant neoplasm of colon: Secondary | ICD-10-CM

## 2022-01-07 MED ORDER — SUTAB 1479-225-188 MG PO TABS: 12.0000 | ORAL_TABLET | Freq: Once | ORAL | 0 refills | Status: AC

## 2022-01-07 NOTE — Progress Notes (Signed)
Gastroenterology Pre-Procedure Review  Request Date: 02/26/2022 Requesting Physician: Dr. Allen Norris  PATIENT REVIEW QUESTIONS: The patient responded to the following health history questions as indicated:    1. Are you having any GI issues? no 2. Do you have a personal history of Polyps? no 3. Do you have a family history of Colon Cancer or Polyps? yes (mom had rectal cancer) 4. Diabetes Mellitus? no 5. Joint replacements in the past 12 months?no 6. Major health problems in the past 3 months?no 7. Any artificial heart valves, MVP, or defibrillator?no    MEDICATIONS & ALLERGIES:    Patient reports the following regarding taking any anticoagulation/antiplatelet therapy:   Plavix, Coumadin, Eliquis, Xarelto, Lovenox, Pradaxa, Brilinta, or Effient? no Aspirin? no  Patient confirms/reports the following medications:  Current Outpatient Medications  Medication Sig Dispense Refill   gabapentin (NEURONTIN) 100 MG capsule Take 2 capsules (200 mg total) by mouth 3 (three) times daily. Start by taking 1 capsule (100 mg) by mouth 3 times daily for 2 weeks, then taper up dose. 180 capsule 5   levothyroxine (SYNTHROID) 50 MCG tablet TAKE 1 TABLET BY MOUTH EVERY DAY 90 tablet 1   linaclotide (LINZESS) 290 MCG CAPS capsule Take 1 capsule (290 mcg total) by mouth daily before breakfast. Sample 30 capsule 3   No current facility-administered medications for this visit.    Patient confirms/reports the following allergies:  No Known Allergies  No orders of the defined types were placed in this encounter.   AUTHORIZATION INFORMATION Primary Insurance: 1D#: Group #:  Secondary Insurance: 1D#: Group #:  SCHEDULE INFORMATION: Date: 02/26/2022 Time: Location:[facility]

## 2022-01-21 ENCOUNTER — Encounter: Payer: Self-pay | Admitting: Nurse Practitioner

## 2022-01-21 ENCOUNTER — Other Ambulatory Visit: Payer: Self-pay

## 2022-01-21 ENCOUNTER — Ambulatory Visit: Payer: BC Managed Care – PPO | Admitting: Nurse Practitioner

## 2022-01-21 VITALS — BP 133/90 | HR 94 | Temp 98.4°F | Wt 177.8 lb

## 2022-01-21 DIAGNOSIS — D649 Anemia, unspecified: Secondary | ICD-10-CM

## 2022-01-21 DIAGNOSIS — E039 Hypothyroidism, unspecified: Secondary | ICD-10-CM

## 2022-01-21 DIAGNOSIS — R5383 Other fatigue: Secondary | ICD-10-CM

## 2022-01-21 NOTE — Assessment & Plan Note (Signed)
Chronic. Lab work improved to normal range 1 month ago.  If fatigue is not improved will try Brand name Synthroid. Will make recommendations based on lab results.

## 2022-01-21 NOTE — Progress Notes (Signed)
BP 133/90    Pulse 94    Temp 98.4 F (36.9 C) (Oral)    Wt 177 lb 12.8 oz (80.6 kg)    SpO2 98%    BMI 27.03 kg/m    Subjective:    Patient ID: Kari Acosta, female    DOB: January 30, 1976, 46 y.o.   MRN: [id number]  HPI: MIYANNA Acosta is a 46 y.o. female  Chief Complaint  Patient presents with   Hypothyroidism   Abdominal Pain    3 month f/up    HYPOTHYROIDISM Thyroid control status:controlled Satisfied with current treatment? yes Medication side effects: no Medication compliance: excellent compliance Etiology of hypothyroidism:  Recent dose adjustment:no Fatigue: yes Cold intolerance: yes Heat intolerance: no Weight gain: yes Weight loss: no Constipation: yes Diarrhea/loose stools: no Palpitations: no Lower extremity edema: no Anxiety/depressed mood: no    Relevant past medical, surgical, family and social history reviewed and updated as indicated. Interim medical history since our last visit reviewed. Allergies and medications reviewed and updated.  Review of Systems  Constitutional:  Positive for fatigue. Negative for unexpected weight change.  Cardiovascular:  Negative for palpitations and leg swelling.  Gastrointestinal:  Negative for constipation and diarrhea.  Endocrine: Negative for cold intolerance and heat intolerance.  Psychiatric/Behavioral:  Negative for dysphoric mood. The patient is not nervous/anxious.    Per HPI unless specifically indicated above     Objective:    BP 133/90    Pulse 94    Temp 98.4 F (36.9 C) (Oral)    Wt 177 lb 12.8 oz (80.6 kg)    SpO2 98%    BMI 27.03 kg/m   Wt Readings from Last 3 Encounters:  01/21/22 177 lb 12.8 oz (80.6 kg)  10/22/21 172 lb (78 kg)  10/21/21 172 lb 12.8 oz (78.4 kg)    Physical Exam Vitals and nursing note reviewed.  Constitutional:      General: She is not in acute distress.    Appearance: Normal appearance. She is normal weight. She is not ill-appearing, toxic-appearing or diaphoretic.   HENT:     Head: Normocephalic.     Right Ear: External ear normal.     Left Ear: External ear normal.     Nose: Nose normal.     Mouth/Throat:     Mouth: Mucous membranes are moist.     Pharynx: Oropharynx is clear.  Eyes:     General:        Right eye: No discharge.        Left eye: No discharge.     Extraocular Movements: Extraocular movements intact.     Conjunctiva/sclera: Conjunctivae normal.     Pupils: Pupils are equal, round, and reactive to light.  Cardiovascular:     Rate and Rhythm: Normal rate and regular rhythm.     Heart sounds: No murmur heard. Pulmonary:     Effort: Pulmonary effort is normal. No respiratory distress.     Breath sounds: Normal breath sounds. No wheezing or rales.  Musculoskeletal:     Cervical back: Normal range of motion and neck supple.  Skin:    General: Skin is warm and dry.     Capillary Refill: Capillary refill takes less than 2 seconds.  Neurological:     General: No focal deficit present.     Mental Status: She is alert and oriented to person, place, and time. Mental status is at baseline.  Psychiatric:        Mood and  Affect: Mood normal.        Behavior: Behavior normal.        Thought Content: Thought content normal.        Judgment: Judgment normal.    Results for orders placed or performed in visit on 12/02/21  TSH  Result Value Ref Range   TSH 4.120 0.450 - 4.500 uIU/mL      Assessment & Plan:   Problem List Items Addressed This Visit       Endocrine   Hypothyroidism - Primary    Chronic. Lab work improved to normal range 1 month ago.  If fatigue is not improved will try Brand name Synthroid. Will make recommendations based on lab results.       Other Visit Diagnoses     Anemia, unspecified type       Found on Labs from July. Will draw anemia panel and vitamin D to evaluate fatigue and see if supplementation is needed. Will make recommendations based on labs.   Relevant Orders   Anemia Profile B   Vitamin D  (25 hydroxy)   Other fatigue       Will draw anemia panel and vitamin D to evaluate fatigue and see if supplementation is needed. Will make recommendations based on labs.   Relevant Orders   Vitamin D (25 hydroxy)   FSH/LH   Estradiol        Follow up plan: No follow-ups on file.

## 2022-01-22 LAB — ANEMIA PROFILE B
Basophils Absolute: 0.1 10*3/uL (ref 0.0–0.2)
Basos: 1 %
EOS (ABSOLUTE): 0.4 10*3/uL (ref 0.0–0.4)
Eos: 8 %
Ferritin: 6 ng/mL — ABNORMAL LOW (ref 15–150)
Folate: 4.4 ng/mL (ref >3.0)
Hematocrit: 31.6 % — ABNORMAL LOW (ref 34.0–46.6)
Hemoglobin: 9.8 g/dL — ABNORMAL LOW (ref 11.1–15.9)
Immature Grans (Abs): 0 10*3/uL (ref 0.0–0.1)
Immature Granulocytes: 0 %
Iron Saturation: 6 % — CL (ref 15–55)
Iron: 20 ug/dL — ABNORMAL LOW (ref 27–159)
Lymphocytes Absolute: 1.6 10*3/uL (ref 0.7–3.1)
Lymphs: 31 %
MCH: 25 pg — ABNORMAL LOW (ref 26.6–33.0)
MCHC: 31 g/dL — ABNORMAL LOW (ref 31.5–35.7)
MCV: 81 fL (ref 79–97)
Monocytes Absolute: 0.5 10*3/uL (ref 0.1–0.9)
Monocytes: 10 %
Neutrophils Absolute: 2.6 10*3/uL (ref 1.4–7.0)
Neutrophils: 50 %
Platelets: 164 10*3/uL (ref 150–450)
RBC: 3.92 x10E6/uL (ref 3.77–5.28)
RDW: 13.6 % (ref 11.7–15.4)
Retic Ct Pct: 0.8 % (ref 0.6–2.6)
Total Iron Binding Capacity: 361 ug/dL (ref 250–450)
UIBC: 341 ug/dL (ref 131–425)
Vitamin B-12: 375 pg/mL (ref 232–1245)
WBC: 5.1 10*3/uL (ref 3.4–10.8)

## 2022-01-22 LAB — ESTRADIOL: Estradiol: 193 pg/mL

## 2022-01-22 LAB — FSH/LH
FSH: 8.8 m[IU]/mL
LH: 11.5 m[IU]/mL

## 2022-01-22 LAB — VITAMIN D 25 HYDROXY (VIT D DEFICIENCY, FRACTURES): Vit D, 25-Hydroxy: 46.2 ng/mL (ref 30.0–100.0)

## 2022-01-22 NOTE — Progress Notes (Signed)
Please let patient know that her lab work shows that she has Iron deficiency anemia.  This is likely the cause of her fatigue.  I would like her to start Ferrous Sulfate 325mg  daily. This may cause constipation. Taking it with orange juice can make it less harsh on the abdomen.  I recommend she start Colace daily to help with the constipation.  Please have her come back and see in and see me in 6 weeks so we can see how she is dong.

## 2022-02-06 ENCOUNTER — Telehealth (INDEPENDENT_AMBULATORY_CARE_PROVIDER_SITE_OTHER): Payer: Self-pay | Admitting: *Deleted

## 2022-02-06 NOTE — Telephone Encounter (Signed)
Called patient to verify insurance because her insurer on file advised she was not covered. She advised she is verified her numbers. Called [employer] and started a prior Kari Acosta which shows pending in the system. All information sent and will wait the decision.

## 2022-02-25 ENCOUNTER — Encounter: Payer: Self-pay | Admitting: Gastroenterology

## 2022-02-26 ENCOUNTER — Ambulatory Visit: Payer: BC Managed Care – PPO | Admitting: Certified Registered Nurse Anesthetist

## 2022-02-26 ENCOUNTER — Encounter: Payer: Self-pay | Admitting: Gastroenterology

## 2022-02-26 ENCOUNTER — Ambulatory Visit
Admission: RE | Admit: 2022-02-26 | Discharge: 2022-02-26 | Disposition: A | Discharge status: Home / Self Care | Payer: BC Managed Care – PPO | Attending: Gastroenterology | Admitting: Gastroenterology

## 2022-02-26 ENCOUNTER — Encounter
Admission: RE | Disposition: A | Discharge status: Home / Self Care | Payer: Self-pay | Source: Home / Self Care | Attending: Gastroenterology

## 2022-02-26 DIAGNOSIS — E039 Hypothyroidism, unspecified: Secondary | ICD-10-CM | POA: Diagnosis not present

## 2022-02-26 DIAGNOSIS — D125 Benign neoplasm of sigmoid colon: Secondary | ICD-10-CM | POA: Diagnosis not present

## 2022-02-26 DIAGNOSIS — Z800 Family history of malignant neoplasm of digestive organs: Secondary | ICD-10-CM | POA: Insufficient documentation

## 2022-02-26 DIAGNOSIS — K635 Polyp of colon: Secondary | ICD-10-CM | POA: Diagnosis not present

## 2022-02-26 DIAGNOSIS — K648 Other hemorrhoids: Secondary | ICD-10-CM | POA: Diagnosis not present

## 2022-02-26 DIAGNOSIS — D124 Benign neoplasm of descending colon: Secondary | ICD-10-CM | POA: Insufficient documentation

## 2022-02-26 DIAGNOSIS — Z1211 Encounter for screening for malignant neoplasm of colon: Secondary | ICD-10-CM

## 2022-02-26 HISTORY — PX: COLONOSCOPY WITH PROPOFOL: SHX5780

## 2022-02-26 SURGERY — COLONOSCOPY WITH PROPOFOL
Anesthesia: General

## 2022-02-26 MED ORDER — LIDOCAINE HCL (PF) 2 % IJ SOLN
INTRAMUSCULAR | Status: AC
  Filled 2022-02-26: qty 5

## 2022-02-26 MED ORDER — PROPOFOL 10 MG/ML IV BOLUS
INTRAVENOUS | Status: DC | PRN
  Administered 2022-02-26: 80 mg via INTRAVENOUS

## 2022-02-26 MED ORDER — LIDOCAINE HCL (CARDIAC) PF 100 MG/5ML IV SOSY
PREFILLED_SYRINGE | INTRAVENOUS | Status: DC | PRN
  Administered 2022-02-26: 50 mg via INTRAVENOUS

## 2022-02-26 MED ORDER — PROPOFOL 500 MG/50ML IV EMUL
INTRAVENOUS | Status: DC | PRN
  Administered 2022-02-26: 150 ug/kg/min via INTRAVENOUS

## 2022-02-26 MED ORDER — SODIUM CHLORIDE 0.9 % IV SOLN: INTRAVENOUS | Status: DC

## 2022-02-26 MED ORDER — PROPOFOL 500 MG/50ML IV EMUL
INTRAVENOUS | Status: AC
  Filled 2022-02-26: qty 50

## 2022-02-26 NOTE — Transfer of Care (Signed)
Immediate Anesthesia Transfer of Care Note ? ?Patient: Kari Acosta ? ?Procedure(s) Performed: COLONOSCOPY WITH PROPOFOL ? ?Patient Location: PACU ? ?Anesthesia Type:General ? ?Level of Consciousness: sedated ? ?Airway & Oxygen Therapy: Patient Spontanous Breathing ? ?Post-op Assessment: Report given to RN and Post -op Vital signs reviewed and stable ? ?Post vital signs: Reviewed and stable ? ?Last Vitals:  ?Vitals Value Taken Time  ?BP 118/79 02/26/22 0839  ?Temp    ?Pulse    ?Resp 14 02/26/22 0839  ?SpO2    ? ? ?Last Pain:  ?Vitals:  ? 02/26/22 0739  ?TempSrc: Temporal  ?   ? ?  ? ?Complications: No notable events documented. ?

## 2022-02-26 NOTE — H&P (Signed)
? ?Kari Lame, MD [facility] ?[address] ?[place], [place] [address] ?Phone: [phone] ?Fax : [phone] ? ?Primary Care Physician:  Jon Billings, NP ?Primary Gastroenterologist:  Dr. Allen Norris ? ?Pre-Procedure History & Physical: ?HPI:  Kari Acosta is a 46 y.o. female is here for a screening colonoscopy.  ? ?Past Medical History:  ?Diagnosis Date  ? Family history of colon cancer in mother   ? Family history of ovarian cancer 08/2021  ? cancer genetic testing letter sent  ? History of kidney stones   ? Hypothyroidism   ? Renal disorder   ? kidney stones  ? ? ?Past Surgical History:  ?Procedure Laterality Date  ? CESAREAN SECTION    ? CHOLECYSTECTOMY    ? CYSTOSCOPY  09/23/2021  ? Procedure: CYSTOSCOPY;  Surgeon: Gae Dry, MD;  Location: [facility] ORS;  Service: Gynecology;;  ? GALLBLADDER SURGERY    ? LAPAROSCOPIC BILATERAL SALPINGECTOMY Bilateral 09/23/2021  ? Procedure: LAPAROSCOPIC BILATERAL SALPINGECTOMY;  Surgeon: Gae Dry, MD;  Location: [facility] ORS;  Service: Gynecology;  Laterality: Bilateral;  ? LAPAROSCOPIC SUPRACERVICAL HYSTERECTOMY  09/23/2021  ? Procedure: LAPAROSCOPIC SUPRACERVICAL HYSTERECTOMY WITH ABLATION OF ENDOMETRIAL CELLS;  Surgeon: Gae Dry, MD;  Location: [facility] ORS;  Service: Gynecology;;  ? ? ?Prior to Admission medications   ?Medication Sig Start Date End Date Taking? Authorizing Provider  ?levothyroxine (SYNTHROID) 50 MCG tablet TAKE 1 TABLET BY MOUTH EVERY DAY 01/05/22  Yes Jon Billings, NP  ?prochlorperazine (COMPAZINE) 10 MG tablet Take 1 tablet (10 mg total) by mouth every 8 (eight) hours as needed (headache). 09/12/18 01/07/22  Nance Pear, MD  ? ? ?Allergies as of 01/07/2022  ? (No Known Allergies)  ? ? ?Family History  ?Problem Relation Age of Onset  ? Colon cancer Mother 87  ? Hypertension Mother   ? Hyperlipidemia Father   ? Hypertension Father   ? Stroke Father   ? Healthy Son   ? Healthy Son   ? Ovarian cancer Maternal Aunt   ? Breast  cancer Maternal Grandmother   ? Cancer Maternal Grandmother   ?     Gallbladder Cancer  ? Lung cancer Maternal Grandmother   ? Hypertension Maternal Grandfather   ? Heart failure Paternal Grandmother   ? Tuberculosis Paternal Grandfather   ? Cancer - Other Maternal Great-grandmother   ?     Gallbladder Cancer  ? ? ?Social History  ? ?Socioeconomic History  ? Marital status: Significant Other  ?  Spouse name: Not on file  ? Number of children: 1  ? Years of education: Not on file  ? Highest education level: Not on file  ?Occupational History  ? Not on file  ?Tobacco Use  ? Smoking status: Never  ? Smokeless tobacco: Never  ?Vaping Use  ? Vaping Use: Never used  ?Substance and Sexual Activity  ? Alcohol use: No  ? Drug use: Never  ? Sexual activity: Yes  ?Other Topics Concern  ? Not on file  ?Social History Narrative  ? Not on file  ? ?Social Determinants of Health  ? ?Financial Resource Strain: Not on file  ?Food Insecurity: Not on file  ?Transportation Needs: Not on file  ?Physical Activity: Not on file  ?Stress: Not on file  ?Social Connections: Not on file  ?Intimate Partner Violence: Not on file  ? ? ?Review of Systems: ?See HPI, otherwise negative ROS ? ?Physical Exam: ?BP (!) 131/96   Pulse 96   Temp (!) 97.4 ?F (36.3 ?C) (  Temporal)   Resp 18   Ht '5\' 8"'$  (1.727 m)   Wt 77.1 kg   LMP 09/22/2021 (Approximate)   SpO2 100%   BMI 25.85 kg/m?  ?General:   Alert,  pleasant and cooperative in NAD ?Head:  Normocephalic and atraumatic. ?Neck:  Supple; no masses or thyromegaly. ?Lungs:  Clear throughout to auscultation.    ?Heart:  Regular rate and rhythm. ?Abdomen:  Soft, nontender and nondistended. Normal bowel sounds, without guarding, and without rebound.   ?Neurologic:  Alert and  oriented x4;  grossly normal neurologically. ? ?Impression/Plan: ?TULSI CROSSETT is now here to undergo a screening colonoscopy. ? ?Risks, benefits, and alternatives regarding colonoscopy have been reviewed with the patient.   Questions have been answered.  All parties agreeable. ?

## 2022-02-26 NOTE — Anesthesia Postprocedure Evaluation (Signed)
Anesthesia Post Note ? ?Patient: Kari Acosta ? ?Procedure(s) Performed: COLONOSCOPY WITH PROPOFOL ? ?Patient location during evaluation: PACU ?Anesthesia Type: General ?Level of consciousness: awake and alert ?Pain management: pain level controlled ?Vital Signs Assessment: post-procedure vital signs reviewed and stable ?Respiratory status: spontaneous breathing, nonlabored ventilation, respiratory function stable and patient connected to nasal cannula oxygen ?Cardiovascular status: blood pressure returned to baseline and stable ?Postop Assessment: no apparent nausea or vomiting ?Anesthetic complications: no ? ? ?No notable events documented. ? ? ?Last Vitals:  ?Vitals:  ? 02/26/22 0838 02/26/22 0839  ?BP: 118/79 118/79  ?Pulse: 78   ?Resp: 14 14  ?Temp: (!) 36.2 ?C   ?SpO2: 100%   ?  ?Last Pain:  ?Vitals:  ? 02/26/22 0858  ?TempSrc:   ?PainSc: 0-No pain  ? ? ?  ?  ?  ?  ?  ?  ? ?Molli Barrows ? ? ? ? ?

## 2022-02-26 NOTE — Op Note (Signed)
[facility] ?Gastroenterology ?Patient Name: Kari Acosta ?Procedure Date: 02/26/2022 8:04 AM ?MRN: [id number] ?Account #: [id number] ?Date of Birth: Oct 16, 1976 ?Admit Type: Outpatient ?Age: 46 ?Room: [facility] ENDO ROOM 3 ?Gender: Female ?Note Status: Finalized ?Instrument Name: Colonoscope [id number] ?Procedure:             Colonoscopy ?Indications:           Screening for colorectal malignant neoplasm, Family  ?                       history of colon cancer in a first-degree relative ?Providers:             Lucilla Lame MD, MD ?Referring MD:          Jon Billings (Referring MD) ?Medicines:             Propofol per Anesthesia ?Complications:         No immediate complications. ?Procedure:             Pre-Anesthesia Assessment: ?                       - Prior to the procedure, a History and Physical was  ?                       performed, and patient medications and allergies were  ?                       reviewed. The patient's tolerance of previous  ?                       anesthesia was also reviewed. The risks and benefits  ?                       of the procedure and the sedation options and risks  ?                       were discussed with the patient. All questions were  ?                       answered, and informed consent was obtained. Prior  ?                       Anticoagulants: The patient has taken no previous  ?                       anticoagulant or antiplatelet agents. ASA Grade  ?                       Assessment: II - A patient with mild systemic disease.  ?                       After reviewing the risks and benefits, the patient  ?                       was deemed in satisfactory condition to undergo the  ?                       procedure. ?  After obtaining informed consent, the colonoscope was  ?                       passed under direct vision. Throughout the procedure,  ?                       the patient's blood pressure, pulse, and oxygen  ?                        saturations were monitored continuously. The  ?                       Colonoscope was introduced through the anus and  ?                       advanced to the the cecum, identified by appendiceal  ?                       orifice and ileocecal valve. The colonoscopy was  ?                       performed without difficulty. The patient tolerated  ?                       the procedure well. The quality of the bowel  ?                       preparation was good. ?Findings: ?     The perianal and digital rectal examinations were normal. ?     Two semi-pedunculated polyps were found in the sigmoid colon. The polyps  ?     were 4 to 8 mm in size. These polyps were removed with a cold snare.  ?     Resection and retrieval were complete. ?     A 5 mm polyp was found in the descending colon. The polyp was sessile.  ?     The polyp was removed with a cold snare. Resection and retrieval were  ?     complete. ?     Non-bleeding internal hemorrhoids were found during retroflexion. The  ?     hemorrhoids were Grade II (internal hemorrhoids that prolapse but reduce  ?     spontaneously). ?Impression:            - Two 4 to 8 mm polyps in the sigmoid colon, removed  ?                       with a cold snare. Resected and retrieved. ?                       - One 5 mm polyp in the descending colon, removed with  ?                       a cold snare. Resected and retrieved. ?                       - Non-bleeding internal hemorrhoids. ?Recommendation:        - Discharge patient to home. ?                       -  Resume previous diet. ?                       - Continue present medications. ?                       - Await pathology results. ?                       - If the pathology report reveals adenomatous tissue,  ?                       then repeat the colonoscopy for surveillance in 5  ?                       years. ?Procedure Code(s):     --- Professional --- ?                       [phone], Colonoscopy, flexible; with  removal of  ?                       tumor(s), polyp(s), or other lesion(s) by snare  ?                       technique ?Diagnosis Code(s):     --- Professional --- ?                       Z12.11, Encounter for screening for malignant neoplasm  ?                       of colon ?                       Z80.0, Family history of malignant neoplasm of  ?                       digestive organs ?                       K63.5, Polyp of colon ?CPT copyright 2019 American Medical Association. All rights reserved. ?The codes documented in this report are preliminary and upon coder review may  ?be revised to meet current compliance requirements. ?Lucilla Lame MD, MD ?02/26/2022 8:36:21 AM ?This report has been signed electronically. ?Number of Addenda: 0 ?Note Initiated On: 02/26/2022 8:04 AM ?Scope Withdrawal Time: 0 hours 12 minutes 4 seconds  ?Total Procedure Duration: 0 hours 22 minutes 38 seconds  ?Estimated Blood Loss:  Estimated blood loss: none. ?     [facility] ?

## 2022-02-26 NOTE — Anesthesia Procedure Notes (Signed)
Date/Time: 02/26/2022 8:05 AM ?Performed by: Johnna Acosta, CRNA ?Pre-anesthesia Checklist: Patient identified, Emergency Drugs available, Suction available, Patient being monitored and Timeout performed ?Patient Re-evaluated:Patient Re-evaluated prior to induction ?Oxygen Delivery Method: Nasal cannula ?Preoxygenation: Pre-oxygenation with 100% oxygen ?Induction Type: IV induction ? ? ? ? ?

## 2022-02-26 NOTE — Anesthesia Preprocedure Evaluation (Signed)
Anesthesia Evaluation  ?Patient identified by MRN, date of birth, ID band ?Patient awake ? ? ? ?Reviewed: ?Allergy & Precautions, H&P , NPO status , Patient's Chart, lab work & pertinent test results, reviewed documented beta blocker date and time  ? ?Airway ?Mallampati: II ? ? ?Neck ROM: full ? ? ? Dental ? ?(+) Poor Dentition ?  ?Pulmonary ?neg pulmonary ROS,  ?  ?Pulmonary exam normal ? ? ? ? ? ? ? Cardiovascular ?Exercise Tolerance: Good ?negative cardio ROS ?Normal cardiovascular exam ?Rhythm:regular Rate:Normal ? ? ?  ?Neuro/Psych ?negative neurological ROS ? negative psych ROS  ? GI/Hepatic ?negative GI ROS, Neg liver ROS,   ?Endo/Other  ?Hypothyroidism  ? Renal/GU ?Renal disease  ?negative genitourinary ?  ?Musculoskeletal ? ? Abdominal ?  ?Peds ? Hematology ?negative hematology ROS ?(+)   ?Anesthesia Other Findings ?Past Medical History: ?No date: Family history of colon cancer in mother ?08/2021: Family history of ovarian cancer ?    Comment:  cancer genetic testing letter sent ?No date: History of kidney stones ?No date: Hypothyroidism ?No date: Renal disorder ?    Comment:  kidney stones ?Past Surgical History: ?No date: CESAREAN SECTION ?No date: CHOLECYSTECTOMY ?09/23/2021: CYSTOSCOPY ?    Comment:  Procedure: CYSTOSCOPY;  Surgeon: Gae Dry, MD;   ?             Location: [facility] ORS;  Service: Gynecology;; ?No date: GALLBLADDER SURGERY ?09/23/2021: LAPAROSCOPIC BILATERAL SALPINGECTOMY; Bilateral ?    Comment:  Procedure: LAPAROSCOPIC BILATERAL SALPINGECTOMY;   ?             Surgeon: Gae Dry, MD;  Location: [facility] ORS;   ?             Service: Gynecology;  Laterality: Bilateral; ?09/23/2021: LAPAROSCOPIC SUPRACERVICAL HYSTERECTOMY ?    Comment:  Procedure: LAPAROSCOPIC SUPRACERVICAL HYSTERECTOMY WITH  ?             ABLATION OF ENDOMETRIAL CELLS;  Surgeon: Teresa Coombs  ?             P, MD;  Location: [facility] ORS;  Service: Gynecology;; ?BMI   ? Body Mass  Index: 25.85 kg/m?  ?  ? Reproductive/Obstetrics ?negative OB ROS ? ?  ? ? ? ? ? ? ? ? ? ? ? ? ? ?  ?  ? ? ? ? ? ? ? ? ?Anesthesia Physical ?Anesthesia Plan ? ?ASA: 2 ? ?Anesthesia Plan: General  ? ?Post-op Pain Management:   ? ?Induction:  ? ?PONV Risk Score and Plan:  ? ?Airway Management Planned:  ? ?Additional Equipment:  ? ?Intra-op Plan:  ? ?Post-operative Plan:  ? ?Informed Consent: I have reviewed the patients History and Physical, chart, labs and discussed the procedure including the risks, benefits and alternatives for the proposed anesthesia with the patient or authorized representative who has indicated his/her understanding and acceptance.  ? ? ? ?Dental Advisory Given ? ?Plan Discussed with: CRNA ? ?Anesthesia Plan Comments:   ? ? ? ? ? ? ?Anesthesia Quick Evaluation ? ?

## 2022-02-27 ENCOUNTER — Encounter: Payer: Self-pay | Admitting: Gastroenterology

## 2022-03-02 ENCOUNTER — Encounter: Payer: Self-pay | Admitting: Gastroenterology

## 2022-03-02 LAB — SURGICAL PATHOLOGY

## 2022-03-04 ENCOUNTER — Ambulatory Visit: Payer: BC Managed Care – PPO | Admitting: Nurse Practitioner

## 2022-03-04 NOTE — Progress Notes (Deleted)
? ?LMP 09/22/2021 (Approximate)   ? ?Subjective:  ? ? Patient ID: Kari Acosta, female    DOB: 09/16/1976, 46 y.o.   MRN: [id number] ? ?HPI: ?Kari Acosta is a 46 y.o. female ? ?No chief complaint on file. ? ?HYPOTHYROIDISM ?Thyroid control status:controlled ?Satisfied with current treatment? yes ?Medication side effects: no ?Medication compliance: excellent compliance ?Etiology of hypothyroidism:  ?Recent dose adjustment:no ?Fatigue: yes ?Cold intolerance: yes ?Heat intolerance: no ?Weight gain: yes ?Weight loss: no ?Constipation: yes ?Diarrhea/loose stools: no ?Palpitations: no ?Lower extremity edema: no ?Anxiety/depressed mood: no ? ? ? ?Relevant past medical, surgical, family and social history reviewed and updated as indicated. Interim medical history since our last visit reviewed. ?Allergies and medications reviewed and updated. ? ?Review of Systems  ?Constitutional:  Positive for fatigue. Negative for unexpected weight change.  ?Cardiovascular:  Negative for palpitations and leg swelling.  ?Gastrointestinal:  Negative for constipation and diarrhea.  ?Endocrine: Negative for cold intolerance and heat intolerance.  ?Psychiatric/Behavioral:  Negative for dysphoric mood. The patient is not nervous/anxious.   ? ?Per HPI unless specifically indicated above ? ?   ?Objective:  ?  ?LMP 09/22/2021 (Approximate)   ?Wt Readings from Last 3 Encounters:  ?02/26/22 170 lb (77.1 kg)  ?01/21/22 177 lb 12.8 oz (80.6 kg)  ?10/22/21 172 lb (78 kg)  ?  ?Physical Exam ?Vitals and nursing note reviewed.  ?Constitutional:   ?   General: She is not in acute distress. ?   Appearance: Normal appearance. She is normal weight. She is not ill-appearing, toxic-appearing or diaphoretic.  ?HENT:  ?   Head: Normocephalic.  ?   Right Ear: External ear normal.  ?   Left Ear: External ear normal.  ?   Nose: Nose normal.  ?   Mouth/Throat:  ?   Mouth: Mucous membranes are moist.  ?   Pharynx: Oropharynx is clear.  ?Eyes:  ?   General:      ?   Right eye: No discharge.     ?   Left eye: No discharge.  ?   Extraocular Movements: Extraocular movements intact.  ?   Conjunctiva/sclera: Conjunctivae normal.  ?   Pupils: Pupils are equal, round, and reactive to light.  ?Cardiovascular:  ?   Rate and Rhythm: Normal rate and regular rhythm.  ?   Heart sounds: No murmur heard. ?Pulmonary:  ?   Effort: Pulmonary effort is normal. No respiratory distress.  ?   Breath sounds: Normal breath sounds. No wheezing or rales.  ?Musculoskeletal:  ?   Cervical back: Normal range of motion and neck supple.  ?Skin: ?   General: Skin is warm and dry.  ?   Capillary Refill: Capillary refill takes less than 2 seconds.  ?Neurological:  ?   General: No focal deficit present.  ?   Mental Status: She is alert and oriented to person, place, and time. Mental status is at baseline.  ?Psychiatric:     ?   Mood and Affect: Mood normal.     ?   Behavior: Behavior normal.     ?   Thought Content: Thought content normal.     ?   Judgment: Judgment normal.  ? ? ?Results for orders placed or performed during the hospital encounter of 02/26/22  ?Surgical pathology  ?Result Value Ref Range  ? SURGICAL PATHOLOGY    ?  SURGICAL PATHOLOGY ?CASE: [id number] ?PATIENT: Kari Acosta ?Surgical Pathology Report ? ? ? ? ?Specimen Submitted: ?A. Colon polyp,  sigmoid; cold snare ?B. Colon polyp, descending; cold snare ?C. Colon polyp, sigmoid; cold snare ? ?Clinical History: Colon cancer screening Z12.11.  Colon polyps ? ? ? ?DIAGNOSIS: ?A. COLON POLYP, SIGMOID; COLD SNARE: ?- TUBULAR ADENOMA. ?- NEGATIVE FOR HIGH-GRADE DYSPLASIA AND MALIGNANCY. ? ?B. COLON POLYP, DESCENDING; COLD SNARE: ?- MINUTE TUBULAR ADENOMA, IN A BACKGROUND OF PREDOMINANTLY FECAL ?MATERIAL. ?- NEGATIVE FOR HIGH-GRADE DYSPLASIA AND MALIGNANCY. ? ?C. COLON POLYP, SIGMOID; COLD SNARE: ?- POLYPOID FRAGMENT OF BENIGN COLONIC MUCOSA WITH SUPERFICIAL REACTIVE ?CHANGES AND SMALL LYMPHOID AGGREGATE. ?- NEGATIVE FOR DYSPLASIA AND  MALIGNANCY. ? ?Comment: ?Multiple additional deeper recut levels were examined for parts B and C. ? ?GROSS DESCRIPTION: ?A. Labeled: Cold snare sigmoid colon polyp ?Received: Formalin ?Collection time: 8:13 AM on 3/1 05/2022 ?Placed into formalin time: 8:13 AM on 02/26/2022 ?Tissue fragment(s): 2 ?Size: Ranges from 0.3-0.6 cm ?Description: Received are tan soft tissue fragments, admixed with ?intestinal debris.  The ratio of soft tissue to intestinal debris is 80: ?20. ?Entirely submitted in 1 cassette. ? ?B. Labeled: Cold snare descending colon polyp ?Received: Formalin ?Collection time: 8:29 AM on 02/26/2022 ?Placed into formalin time: 8:29 AM on 02/26/2022 ?Tissue fragment(s): Multiple ?Size: Aggregate, 2.5 x 0.6 x 0.1 cm ?Description: Received are tan soft tissue fragments, admixed with ?intestinal debris.  The ratio of soft tissue to intestinal debris is 10: ?90. ?Entirely submitted in 1 cassette. ? ?C. Labeled: Cold snare sigmoid colon polyp ?Received: Formalin ?Collection time: 8:33 AM on 02/26/2022 ?Placed into formalin time: 8:33 AM on 02/26/2022 ?Tissue fragment(s): Multiple ?Size: Aggregate, 2.1 x 0.5 x 0.1 cm ?Description: Received are tan soft tissue fragments, admixed with ?intestinal debri s.  The ratio of soft tissue to intestinal debris is 70: ?30. ?Entirely submitted in 1 cassette. ? ?CM 02/26/2022 ? ?Final Diagnosis performed by Allena Napoleon, MD.   Electronically signed ?03/02/2022 9:48:19AM ?The electronic signature indicates that the named Attending Pathologist ?has evaluated the specimen ?Technical component performed at [facility], [address], [place], ?[place] [address] Lab: [phone] Dir: Rush Farmer, MD, MMM ? Professional component performed at [facility], [facility], [place], [place], [place] [address] Lab: [phone] ?Dir: Kathi Simpers, MD ?  ? ?   ?Assessment & Plan:  ? ?Problem List Items Addressed This Visit   ?None ?  ? ?Follow up plan: ?No follow-ups on  file. ? ? ? ? ? ?

## 2022-03-10 ENCOUNTER — Telehealth (INDEPENDENT_AMBULATORY_CARE_PROVIDER_SITE_OTHER): Payer: Self-pay | Admitting: Vascular Surgery

## 2022-03-10 NOTE — Telephone Encounter (Signed)
Spoke with pt to inform her of the denial of sclero. She stated that we should have had her wearing compression stockings and done "all this stuff" like the last place that she went. I advised that we send in all office notes and ultrasound procedures. I advised her of the right to appeal. She said that she would just go somewhere else. Nothing further needed at this time.  ?

## 2022-03-13 ENCOUNTER — Ambulatory Visit: Payer: BC Managed Care – PPO | Admitting: Nurse Practitioner

## 2022-03-13 ENCOUNTER — Encounter: Payer: Self-pay | Admitting: Nurse Practitioner

## 2022-03-13 VITALS — BP 135/87 | HR 91 | Temp 98.3°F | Wt 177.4 lb

## 2022-03-13 DIAGNOSIS — N200 Calculus of kidney: Secondary | ICD-10-CM | POA: Diagnosis not present

## 2022-03-13 DIAGNOSIS — D508 Other iron deficiency anemias: Secondary | ICD-10-CM | POA: Diagnosis not present

## 2022-03-13 NOTE — Progress Notes (Signed)
? ?BP 135/87   Pulse 91   Temp 98.3 ?F (36.8 ?C) (Oral)   Wt 177 lb 6.4 oz (80.5 kg)   LMP 09/22/2021 (Approximate)   SpO2 100%   BMI 26.97 kg/m?   ? ?Subjective:  ? ? Patient ID: Kari Acosta, female    DOB: [birth year], 46 y.o.   MRN: [id number] ? ?HPI: ?Kari Acosta is a 46 y.o. female ? ?Chief Complaint  ?Patient presents with  ? Anemia  ? Pain  ?  Pt states she has been having increased pressure in her kidney and lower abdomen area since having her hysterectomy   ? ? ?ANEMIA ?Anemia status: uncontrolled ?Etiology of anemia: IDA ?Duration of anemia treatment: unsure ?Compliance with treatment:  did not start the iron supplement due to already having constipation. ?Iron supplementation side effects:  NA ?Severity of anemia: moderate ?Fatigue: yes ?Decreased exercise tolerance: no  ?Dyspnea on exertion: no ?Palpitations: no ?Bleeding: no ?Pica: no ? ?Patient states she has a hard time going to the bathroom. She had a colonoscopy recently and was not started on anything for constipation.  States she tried linzess in the past but it made her sick. ? ?Patient states she has been having ongoing pressure in her abdomen as she had before her hysterectomy.  States she was given gabapentin by the OBGYN but she doesn't like taking it.   ? ?Relevant past medical, surgical, family and social history reviewed and updated as indicated. Interim medical history since our last visit reviewed. ?Allergies and medications reviewed and updated. ? ?Review of Systems  ?Constitutional:  Positive for fatigue. Negative for unexpected weight change.  ?Cardiovascular:  Negative for palpitations and leg swelling.  ?Gastrointestinal:  Positive for abdominal pain. Negative for constipation and diarrhea.  ?Endocrine: Negative for cold intolerance and heat intolerance.  ?Psychiatric/Behavioral:  Negative for dysphoric mood. The patient is not nervous/anxious.   ? ?Per HPI unless specifically indicated above ? ?   ?Objective:  ?  ?BP  135/87   Pulse 91   Temp 98.3 ?F (36.8 ?C) (Oral)   Wt 177 lb 6.4 oz (80.5 kg)   LMP 09/22/2021 (Approximate)   SpO2 100%   BMI 26.97 kg/m?   ?Wt Readings from Last 3 Encounters:  ?03/13/22 177 lb 6.4 oz (80.5 kg)  ?02/26/22 170 lb (77.1 kg)  ?01/21/22 177 lb 12.8 oz (80.6 kg)  ?  ?Physical Exam ?Vitals and nursing note reviewed.  ?Constitutional:   ?   General: She is not in acute distress. ?   Appearance: Normal appearance. She is normal weight. She is not ill-appearing, toxic-appearing or diaphoretic.  ?HENT:  ?   Head: Normocephalic.  ?   Right Ear: External ear normal.  ?   Left Ear: External ear normal.  ?   Nose: Nose normal.  ?   Mouth/Throat:  ?   Mouth: Mucous membranes are moist.  ?   Pharynx: Oropharynx is clear.  ?Eyes:  ?   General:     ?   Right eye: No discharge.     ?   Left eye: No discharge.  ?   Extraocular Movements: Extraocular movements intact.  ?   Conjunctiva/sclera: Conjunctivae normal.  ?   Pupils: Pupils are equal, round, and reactive to light.  ?Cardiovascular:  ?   Rate and Rhythm: Normal rate and regular rhythm.  ?   Heart sounds: No murmur heard. ?Pulmonary:  ?   Effort: Pulmonary effort is normal. No respiratory distress.  ?  Breath sounds: Normal breath sounds. No wheezing or rales.  ?Musculoskeletal:  ?   Cervical back: Normal range of motion and neck supple.  ?Skin: ?   General: Skin is warm and dry.  ?   Capillary Refill: Capillary refill takes less than 2 seconds.  ?Neurological:  ?   General: No focal deficit present.  ?   Mental Status: She is alert and oriented to person, place, and time. Mental status is at baseline.  ?Psychiatric:     ?   Mood and Affect: Mood normal.     ?   Behavior: Behavior normal.     ?   Thought Content: Thought content normal.     ?   Judgment: Judgment normal.  ? ? ?Results for orders placed or performed during the hospital encounter of 02/26/22  ?Surgical pathology  ?Result Value Ref Range  ? SURGICAL PATHOLOGY    ?  SURGICAL  PATHOLOGY ?CASE: [id number] ?PATIENT: Kari Acosta ?Surgical Pathology Report ? ? ? ? ?Specimen Submitted: ?A. Colon polyp, sigmoid; cold snare ?B. Colon polyp, descending; cold snare ?C. Colon polyp, sigmoid; cold snare ? ?Clinical History: Colon cancer screening Z12.11.  Colon polyps ? ? ? ?DIAGNOSIS: ?A. COLON POLYP, SIGMOID; COLD SNARE: ?- TUBULAR ADENOMA. ?- NEGATIVE FOR HIGH-GRADE DYSPLASIA AND MALIGNANCY. ? ?B. COLON POLYP, DESCENDING; COLD SNARE: ?- MINUTE TUBULAR ADENOMA, IN A BACKGROUND OF PREDOMINANTLY FECAL ?MATERIAL. ?- NEGATIVE FOR HIGH-GRADE DYSPLASIA AND MALIGNANCY. ? ?C. COLON POLYP, SIGMOID; COLD SNARE: ?- POLYPOID FRAGMENT OF BENIGN COLONIC MUCOSA WITH SUPERFICIAL REACTIVE ?CHANGES AND SMALL LYMPHOID AGGREGATE. ?- NEGATIVE FOR DYSPLASIA AND MALIGNANCY. ? ?Comment: ?Multiple additional deeper recut levels were examined for parts B and C. ? ?GROSS DESCRIPTION: ?A. Labeled: Cold snare sigmoid colon polyp ?Received: Formalin ?Collection time: 8:13 AM on 3/1 05/2022 ?Placed into formalin time: 8:13 AM on 02/26/2022 ?Tissue fragment(s): 2 ?Size: Ranges from 0.3-0.6 cm ?Description: Received are tan soft tissue fragments, admixed with ?intestinal debris.  The ratio of soft tissue to intestinal debris is 80: ?20. ?Entirely submitted in 1 cassette. ? ?B. Labeled: Cold snare descending colon polyp ?Received: Formalin ?Collection time: 8:29 AM on 02/26/2022 ?Placed into formalin time: 8:29 AM on 02/26/2022 ?Tissue fragment(s): Multiple ?Size: Aggregate, 2.5 x 0.6 x 0.1 cm ?Description: Received are tan soft tissue fragments, admixed with ?intestinal debris.  The ratio of soft tissue to intestinal debris is 10: ?90. ?Entirely submitted in 1 cassette. ? ?C. Labeled: Cold snare sigmoid colon polyp ?Received: Formalin ?Collection time: 8:33 AM on 02/26/2022 ?Placed into formalin time: 8:33 AM on 02/26/2022 ?Tissue fragment(s): Multiple ?Size: Aggregate, 2.1 x 0.5 x 0.1 cm ?Description: Received are tan soft  tissue fragments, admixed with ?intestinal debri s.  The ratio of soft tissue to intestinal debris is 70: ?30. ?Entirely submitted in 1 cassette. ? ?CM 02/26/2022 ? ?Final Diagnosis performed by Allena Napoleon, MD.   Electronically signed ?03/02/2022 9:48:19AM ?The electronic signature indicates that the named Attending Pathologist ?has evaluated the specimen ?Technical component performed at [facility], [address] [place], ?[place] [address] Lab: [phone] Dir: Rush Farmer, MD, MMM ? Professional component performed at [facility], [facility], [place], [place], [place] [address] Lab: [phone] ?Dir: Kathi Simpers, MD ?  ? ?   ?Assessment & Plan:  ? ?Problem List Items Addressed This Visit   ?None ?Visit Diagnoses   ? ? Other iron deficiency anemia    -  Primary  ? Will refer to Hematology due to patient not wanting  to take Ferrous Sulfate due to constipation.  Follow up in 2 months for reevaluation.  ? Relevant Orders  ? Ambulatory referral to Hematology / Oncology  ? Nephrolithiasis      ? Will refer to Urology due to ongoing pain in her bladder area. Patient states she has had kidney stones in the past. Will follow up in 2 months for reevaluation  ? Relevant Orders  ? Ambulatory referral to Urology  ? ?  ?  ? ?Follow up plan: ?Return in about 2 months (around 05/13/2022) for Fatigue and Abdominal Pain. ? ? ? ? ? ?

## 2022-03-19 ENCOUNTER — Encounter: Payer: Self-pay | Admitting: Urology

## 2022-03-19 ENCOUNTER — Ambulatory Visit: Payer: BC Managed Care – PPO | Admitting: Urology

## 2022-03-19 VITALS — BP 143/88 | HR 93 | Ht 68.0 in | Wt 168.0 lb

## 2022-03-19 DIAGNOSIS — Z87442 Personal history of urinary calculi: Secondary | ICD-10-CM

## 2022-03-19 DIAGNOSIS — R102 Pelvic and perineal pain: Secondary | ICD-10-CM

## 2022-03-19 DIAGNOSIS — R350 Frequency of micturition: Secondary | ICD-10-CM

## 2022-03-19 DIAGNOSIS — R3915 Urgency of urination: Secondary | ICD-10-CM | POA: Diagnosis not present

## 2022-03-19 DIAGNOSIS — N200 Calculus of kidney: Secondary | ICD-10-CM

## 2022-03-19 LAB — MICROSCOPIC EXAMINATION: WBC, UA: >30 /hpf — ABNORMAL HIGH (ref 0–5)

## 2022-03-19 LAB — URINALYSIS, COMPLETE
Bilirubin, UA: NEGATIVE
Glucose, UA: NEGATIVE
Ketones, UA: NEGATIVE
Nitrite, UA: NEGATIVE
Specific Gravity, UA: 1.025 (ref 1.005–1.030)
Urobilinogen, Ur: 0.2 mg/dL (ref 0.2–1.0)
pH, UA: 7 (ref 5.0–7.5)

## 2022-03-19 MED ORDER — GEMTESA 75 MG PO TABS: 75.0000 mg | ORAL_TABLET | Freq: Every day | ORAL | 0 refills | Status: DC

## 2022-03-21 ENCOUNTER — Encounter: Payer: Self-pay | Admitting: Urology

## 2022-03-21 NOTE — Progress Notes (Signed)
? ?03/19/2022 ?12:18 PM  ? ?Kari Acosta ?1976/06/06 ?[id number] ? ?Referring provider: Jon Billings, NP ?[address] ?[place],  [place] [address] ? ?Chief Complaint  ?Patient presents with  ? Nephrolithiasis  ? ? ?HPI: ?Kari Acosta is a 46 y.o. female referred for evaluation of lower urinary tract symptoms and pelvic pain.. ? ?Since August 2022 complaining of symptoms of frequency, urgency with voiding small amounts and associated with pelvic pressure ?Saw her gynecologist and noted to have a uterine prolapse.  Underwent laparoscopic supracervical hysterectomy and bilateral salpingectomy by Dr. Kenton Kingfisher 09/23/2021 ?Her pelvic symptoms have persisted after surgery and no abnormalities on exam ?Was given a trial of gabapentin for possible nerve related pain without improvement. ?Prior history stone disease ?No OAB treatments ? ? ?PMH: ?Past Medical History:  ?Diagnosis Date  ? Family history of colon cancer in mother   ? Family history of ovarian cancer 08/2021  ? cancer genetic testing letter sent  ? History of kidney stones   ? Hypothyroidism   ? Renal disorder   ? kidney stones  ? ? ?Surgical History: ?Past Surgical History:  ?Procedure Laterality Date  ? CESAREAN SECTION    ? CHOLECYSTECTOMY    ? COLONOSCOPY WITH PROPOFOL N/A 02/26/2022  ? Procedure: COLONOSCOPY WITH PROPOFOL;  Surgeon: Lucilla Lame, MD;  Location: [facility] ENDOSCOPY;  Service: Endoscopy;  Laterality: N/A;  ? CYSTOSCOPY  09/23/2021  ? Procedure: CYSTOSCOPY;  Surgeon: Gae Dry, MD;  Location: [facility] ORS;  Service: Gynecology;;  ? GALLBLADDER SURGERY    ? LAPAROSCOPIC BILATERAL SALPINGECTOMY Bilateral 09/23/2021  ? Procedure: LAPAROSCOPIC BILATERAL SALPINGECTOMY;  Surgeon: Gae Dry, MD;  Location: [facility] ORS;  Service: Gynecology;  Laterality: Bilateral;  ? LAPAROSCOPIC SUPRACERVICAL HYSTERECTOMY  09/23/2021  ? Procedure: LAPAROSCOPIC SUPRACERVICAL HYSTERECTOMY WITH ABLATION OF ENDOMETRIAL CELLS;  Surgeon: Gae Dry, MD;   Location: [facility] ORS;  Service: Gynecology;;  ? ? ?Home Medications:  ?Allergies as of 03/19/2022   ?No Known Allergies ?  ? ?  ?Medication List  ?  ? ?  ? Accurate as of March 19, 2022 11:59 PM. If you have any questions, ask your nurse or doctor.  ?  ?  ? ?  ? ?Gemtesa 75 MG Tabs ?Generic drug: Vibegron ?Take 75 mg by mouth daily. ?Started by: Abbie Sons, MD ?  ?levothyroxine 50 MCG tablet ?Commonly known as: SYNTHROID ?TAKE 1 TABLET BY MOUTH EVERY DAY ?  ? ?  ? ? ?Allergies: No Known Allergies ? ?Family History: ?Family History  ?Problem Relation Age of Onset  ? Colon cancer Mother 2  ? Hypertension Mother   ? Hyperlipidemia Father   ? Hypertension Father   ? Stroke Father   ? Healthy Son   ? Healthy Son   ? Ovarian cancer Maternal Aunt   ? Breast cancer Maternal Grandmother   ? Cancer Maternal Grandmother   ?     Gallbladder Cancer  ? Lung cancer Maternal Grandmother   ? Hypertension Maternal Grandfather   ? Heart failure Paternal Grandmother   ? Tuberculosis Paternal Grandfather   ? Cancer - Other Maternal Great-grandmother   ?     Gallbladder Cancer  ? ? ?Social History:  reports that she has never smoked. She has never used smokeless tobacco. She reports that she does not drink alcohol and does not use drugs. ? ? ?Physical Exam: ?BP (!) 143/88   Pulse 93   Ht '5\' 8"'$  (1.727 m)   Wt 168 lb (76.2  kg)   LMP 09/22/2021 (Approximate)   BMI 25.54 kg/m?   ?Constitutional:  Alert and oriented, No acute distress. ?HEENT: [place] AT, moist mucus membranes.  Trachea midline, no masses. ?Respiratory: Normal respiratory effort, no increased work of breathing. ?Skin: No rashes, bruises or suspicious lesions. ?Neurologic: Grossly intact, no focal deficits, moving all 4 extremities. ?Psychiatric: Normal mood and affect. ? ?Laboratory Data: ? ?Urinalysis ?Pending ? ? ?Assessment & Plan:   ?46 y.o. female with chronic pelvic pressure, frequency and urgency ?UA pending ?We discussed possible etiologies including overactive  bladder.  She has a prior history of stone disease and the possibility of a nonobstructing distal ureteral calculus was also discussed ?Sample Gemtesa 75 mg daily given ?Schedule noncontrast CT abdomen/pelvis ?UA did return >30 WBCs on microscopy.  She will be contacted for lab visit and urine culture. ? ? ? ?Abbie Sons, MD ? ?[facility] ?[address], [address] ?[place], [place] [address] ?[phone] ? ?

## 2022-03-23 ENCOUNTER — Other Ambulatory Visit: Payer: BC Managed Care – PPO

## 2022-03-23 DIAGNOSIS — R102 Pelvic and perineal pain: Secondary | ICD-10-CM | POA: Diagnosis not present

## 2022-03-26 ENCOUNTER — Other Ambulatory Visit: Payer: Self-pay | Admitting: Internal Medicine

## 2022-03-26 DIAGNOSIS — E611 Iron deficiency: Secondary | ICD-10-CM | POA: Insufficient documentation

## 2022-03-27 ENCOUNTER — Inpatient Hospital Stay: Payer: BC Managed Care – PPO | Admitting: Internal Medicine

## 2022-03-27 ENCOUNTER — Inpatient Hospital Stay: Payer: BC Managed Care – PPO

## 2022-03-27 LAB — CULTURE, URINE COMPREHENSIVE

## 2022-03-31 ENCOUNTER — Other Ambulatory Visit: Payer: Self-pay

## 2022-03-31 ENCOUNTER — Ambulatory Visit
Admission: RE | Admit: 2022-03-31 | Discharge: 2022-03-31 | Disposition: A | Discharge status: Home / Self Care | Payer: BC Managed Care – PPO | Source: Ambulatory Visit | Attending: Urology | Admitting: Urology

## 2022-03-31 DIAGNOSIS — Z87442 Personal history of urinary calculi: Secondary | ICD-10-CM | POA: Diagnosis not present

## 2022-03-31 DIAGNOSIS — Z90710 Acquired absence of both cervix and uterus: Secondary | ICD-10-CM | POA: Diagnosis not present

## 2022-03-31 DIAGNOSIS — R3915 Urgency of urination: Secondary | ICD-10-CM | POA: Diagnosis not present

## 2022-03-31 DIAGNOSIS — N132 Hydronephrosis with renal and ureteral calculous obstruction: Secondary | ICD-10-CM | POA: Diagnosis not present

## 2022-03-31 DIAGNOSIS — R102 Pelvic and perineal pain: Secondary | ICD-10-CM | POA: Diagnosis not present

## 2022-03-31 DIAGNOSIS — Z9049 Acquired absence of other specified parts of digestive tract: Secondary | ICD-10-CM | POA: Diagnosis not present

## 2022-03-31 DIAGNOSIS — E611 Iron deficiency: Secondary | ICD-10-CM

## 2022-03-31 DIAGNOSIS — R350 Frequency of micturition: Secondary | ICD-10-CM | POA: Diagnosis not present

## 2022-04-01 ENCOUNTER — Encounter: Payer: Self-pay | Admitting: Urology

## 2022-04-02 ENCOUNTER — Other Ambulatory Visit: Payer: Self-pay | Admitting: Urology

## 2022-04-02 DIAGNOSIS — N200 Calculus of kidney: Secondary | ICD-10-CM

## 2022-04-02 DIAGNOSIS — N201 Calculus of ureter: Secondary | ICD-10-CM

## 2022-04-02 NOTE — Telephone Encounter (Signed)
Patient called back to let you know that she wants to proceed with surgery. ? ?Kari Acosta ?

## 2022-04-02 NOTE — Progress Notes (Signed)
Surgical Physician Order Form [facility] [place] ? ?* Scheduling expectation : Next Available ? ?*Length of Case: 2 hours ? ?*Clearance needed: no ? ?*Anticoagulation Instructions: N/A ? ?*Aspirin Instructions: N/A ? ?*Post-op visit Date/Instructions:  1 week cysto stent removal ? ?*Diagnosis:  Right ureteral calculi/right nephrolithiasis ? ?*Procedure: Right Ureteroscopy w/laser lithotripsy & stent placement (74944) ? ? ?Additional orders: N/A ? ?-Admit type: OUTpatient ? ?-Anesthesia: Choice ? ?-VTE Prophylaxis Standing Order SCD?s    ?   ?Other:  ? ?-Standing Lab Orders Per Anesthesia   ? ?Lab other: UA&Urine Culture ? ?-Standing Test orders EKG/Chest x-ray per Anesthesia      ? ?Test other:  ? ?- Medications:  Gentamicin per pharmacy ? ?-Other orders:   Needs office preop visit prior to surgery date so I can discuss the procedure with her ? ? ? ?  ? ?

## 2022-04-03 ENCOUNTER — Telehealth: Payer: Self-pay

## 2022-04-03 NOTE — Progress Notes (Signed)
[facility] Posting Form  ? ?Surgery Date/Time: Date: 04/21/2022 ? ?Surgeon: Dr. John Giovanni, MD ? ?Surgery Location: Day Surgery ? ?Inpt ( No  )   Outpt (Yes)   Obs ( No  )  ? ?Diagnosis: N20.1 Right Ureteral Stone, N20.0 Right Nephrolithiasis ? ?-CPT: 22411 ? ?Surgery: Right Ureteroscopy with laser lithotripsy and stent placement ? ?Stop Anticoagulations: No ? ?Cardiac/Medical/Pulmonary Clearance needed: no ? ?*Orders entered into EPIC  Date: 04/03/22  ? ?*Case booked in [place]  Date: 04/03/22 ? ?*Notified pt of Surgery: Date: 04/03/22 ? ?PRE-OP UA & CX: Yes, will obtain on 04/06/2022 ? ?*Placed into Prior Authorization Work Fabio Bering Date: 04/03/22 ? ? ?Assistant/laser/rep:No ? ? ? ? ? ? ? ? ? ? ? ? ? ? ? ?

## 2022-04-03 NOTE — Telephone Encounter (Signed)
I spoke with Kari Acosta. We have discussed possible surgery dates and Tuesday May 9th, 2023 was agreed upon by all parties. Patient given information about surgery date, what to expect pre-operatively and post operatively.  ?We discussed that a Pre-Admission Testing office will be calling to set up the pre-op visit that will take place prior to surgery, and that these appointments are typically done over the phone with a Pre-Admissions RN.  ?Informed patient that our office will communicate any additional care to be provided after surgery. Patients questions or concerns were discussed during our call. Advised to call our office should there be any additional information, questions or concerns that arise. Patient verbalized understanding.  ? ? ? ? ? ? ? ? ? ? ? ? ? ?

## 2022-04-06 ENCOUNTER — Other Ambulatory Visit: Payer: BC Managed Care – PPO

## 2022-04-06 DIAGNOSIS — N201 Calculus of ureter: Secondary | ICD-10-CM

## 2022-04-06 DIAGNOSIS — N200 Calculus of kidney: Secondary | ICD-10-CM | POA: Diagnosis not present

## 2022-04-07 ENCOUNTER — Inpatient Hospital Stay: Payer: BC Managed Care – PPO | Attending: Internal Medicine | Admitting: Internal Medicine

## 2022-04-07 ENCOUNTER — Encounter: Payer: Self-pay | Admitting: Internal Medicine

## 2022-04-07 ENCOUNTER — Inpatient Hospital Stay: Payer: BC Managed Care – PPO

## 2022-04-07 VITALS — BP 128/82 | HR 88

## 2022-04-07 DIAGNOSIS — E611 Iron deficiency: Secondary | ICD-10-CM

## 2022-04-07 DIAGNOSIS — D509 Iron deficiency anemia, unspecified: Secondary | ICD-10-CM | POA: Diagnosis not present

## 2022-04-07 LAB — CBC WITH DIFFERENTIAL/PLATELET
Abs Immature Granulocytes: 0.01 10*3/uL (ref 0.00–0.07)
Basophils Absolute: 0.1 10*3/uL (ref 0.0–0.1)
Basophils Relative: 1 %
Eosinophils Absolute: 0.3 10*3/uL (ref 0.0–0.5)
Eosinophils Relative: 4 %
HCT: 33.4 % — ABNORMAL LOW (ref 36.0–46.0)
Hemoglobin: 10.3 g/dL — ABNORMAL LOW (ref 12.0–15.0)
Immature Granulocytes: 0 %
Lymphocytes Relative: 28 %
Lymphs Abs: 1.8 10*3/uL (ref 0.7–4.0)
MCH: 25.6 pg — ABNORMAL LOW (ref 26.0–34.0)
MCHC: 30.8 g/dL (ref 30.0–36.0)
MCV: 83.1 fL (ref 80.0–100.0)
Monocytes Absolute: 0.6 10*3/uL (ref 0.1–1.0)
Monocytes Relative: 10 %
Neutro Abs: 3.8 10*3/uL (ref 1.7–7.7)
Neutrophils Relative %: 57 %
Platelets: 179 10*3/uL (ref 150–400)
RBC: 4.02 MIL/uL (ref 3.87–5.11)
RDW: 14.2 % (ref 11.5–15.5)
WBC: 6.5 10*3/uL (ref 4.0–10.5)
nRBC: 0 % (ref 0.0–0.2)

## 2022-04-07 LAB — URINALYSIS, COMPLETE
Bilirubin, UA: NEGATIVE
Glucose, UA: NEGATIVE
Ketones, UA: NEGATIVE
Nitrite, UA: NEGATIVE
Protein,UA: NEGATIVE
Specific Gravity, UA: >1.03 — ABNORMAL HIGH (ref 1.005–1.030)
Urobilinogen, Ur: 0.2 mg/dL (ref 0.2–1.0)
pH, UA: 6 (ref 5.0–7.5)

## 2022-04-07 LAB — MICROSCOPIC EXAMINATION: Epithelial Cells (non renal): >10 /hpf — AB (ref 0–10)

## 2022-04-07 LAB — COMPREHENSIVE METABOLIC PANEL
ALT: 11 U/L (ref 0–44)
AST: 15 U/L (ref 15–41)
Albumin: 3.7 g/dL (ref 3.5–5.0)
Alkaline Phosphatase: 39 U/L (ref 38–126)
Anion gap: 4 — ABNORMAL LOW (ref 5–15)
BUN: 11 mg/dL (ref 6–20)
CO2: 27 mmol/L (ref 22–32)
Calcium: 8.7 mg/dL — ABNORMAL LOW (ref 8.9–10.3)
Chloride: 103 mmol/L (ref 98–111)
Creatinine, Ser: 0.66 mg/dL (ref 0.44–1.00)
GFR, Estimated: >60 mL/min (ref >60)
Glucose, Bld: 103 mg/dL — ABNORMAL HIGH (ref 70–99)
Potassium: 3.1 mmol/L — ABNORMAL LOW (ref 3.5–5.1)
Sodium: 134 mmol/L — ABNORMAL LOW (ref 135–145)
Total Bilirubin: 0.3 mg/dL (ref 0.3–1.2)
Total Protein: 7.3 g/dL (ref 6.5–8.1)

## 2022-04-07 LAB — IRON AND TIBC
Iron: 20 ug/dL — ABNORMAL LOW (ref 28–170)
Saturation Ratios: 5 % — ABNORMAL LOW (ref 10.4–31.8)
TIBC: 434 ug/dL (ref 250–450)
UIBC: 414 ug/dL

## 2022-04-07 LAB — FERRITIN: Ferritin: 4 ng/mL — ABNORMAL LOW (ref 11–307)

## 2022-04-07 LAB — LACTATE DEHYDROGENASE: LDH: 126 U/L (ref 98–192)

## 2022-04-07 MED ORDER — SODIUM CHLORIDE 0.9 % IV SOLN
Freq: Once | INTRAVENOUS | Status: AC
  Filled 2022-04-07: qty 250

## 2022-04-07 MED ORDER — IRON SUCROSE 20 MG/ML IV SOLN
200.0000 mg | Freq: Once | INTRAVENOUS | Status: AC
  Administered 2022-04-07: 200 mg via INTRAVENOUS
  Filled 2022-04-07: qty 10

## 2022-04-07 MED ORDER — SODIUM CHLORIDE 0.9 % IV SOLN: 200.0000 mg | Freq: Once | INTRAVENOUS | Status: DC

## 2022-04-07 NOTE — Patient Instructions (Signed)

## 2022-04-07 NOTE — Progress Notes (Signed)
C/o gaining weight and extremely tired. ? ?Would like to discuss genetic testing. ?

## 2022-04-07 NOTE — Assessment & Plan Note (Addendum)
#  Iron deficient anemia: 9.8; ferritin 6/iron saturation 6% [April 2023]-poor tolerance to oral iron.  Recommend iron infusions. Discussed the potential acute infusion reactions with IV iron; which are quite rare.  Patient understands the risk; will proceed with infusions.   ? ?#Etiology of iron deficiency: April 2023 colonoscopy negative for any acute chronic blood loss.  Recommend further work-up including EGD /possible capsule study. CT stone protocol-April 2023 negative for any acute process. ? ?#History of kidney stones/chronic mild hydronephrosis right side-awaiting lithotripsy [Dr. Stoioff]; April 2023-trace blood in UA ? ?Thank you, Ms. Holdsworth NP. for allowing me to participate in the care of your pleasant patient. Please do not hesitate to contact me with questions or concerns in the interim. ? ?# DISPOSITION: ?# NO labs today ?# venofer weekly x4 ?# follow up in 2 months- labs- cbc/bmp; LDH; haptoglobin; retic count; possible venoferDr.B ?

## 2022-04-07 NOTE — Progress Notes (Signed)
[facility] ?CONSULT NOTE ? ?Patient Care Team: ?Jon Billings, NP as PCP - General (Nurse Practitioner) ? ?CHIEF COMPLAINTS/PURPOSE OF CONSULTATION: ANEMIA ? ? ?HEMATOLOGY HISTORY ? ?# ANEMIA[Hb-9; Iron sat; ferritin- 6%; Iron sat-6 [PCP] GFR-wnl; CR hematuria-colonoscopy-April 2023 [Dr.Wohl] NEG; EGD/capsulke- none ? ?# TAH oct 2022; Right hydroKidney stones [Dr.Stoioff- lithotripys pening] ? ?HISTORY OF PRESENTING ILLNESS:  ?Kari Acosta 46 y.o.  female pleasant patient was been referred to [place] for further evaluation of anemia. ? ?Patient has a history of chronic anemia; of unclear etiology.  ? ?Recently patient had a colonoscopy-unremarkable except for small polyps. ? ?Blood in stools:none ?Blood in urine:none ?Difficulty swallowing:none ?Prior blood transfusion:none ?Prior history of blood loss: none ?Liver disease:none ?Alcohol: none ?Bariatric surgery:none ? ?Vaginal bleeding: none; TAH oct 2022  ?Prior evaluation with hematology:none ?Prior bone marrow biopsy: none ?Oral iron: none sec  Constipation/dyspepsia ?Prior IV iron infusions: none ? ? ?Review of Systems  ?Constitutional:  Positive for malaise/fatigue. Negative for chills, diaphoresis, fever and weight loss.  ?HENT:  Negative for nosebleeds and sore throat.   ?Eyes:  Negative for double vision.  ?Respiratory:  Positive for shortness of breath. Negative for cough, hemoptysis, sputum production and wheezing.   ?Cardiovascular:  Negative for chest pain, palpitations, orthopnea and leg swelling.  ?Gastrointestinal:  Negative for abdominal pain, blood in stool, constipation, diarrhea, heartburn, melena, nausea and vomiting.  ?Genitourinary:  Negative for dysuria, frequency and urgency.  ?Musculoskeletal:  Positive for myalgias. Negative for joint pain.  ?Skin: Negative.  Negative for itching and rash.  ?Neurological:  Positive for dizziness, tingling and headaches. Negative for focal weakness and weakness.  ?Endo/Heme/Allergies:  Does  not bruise/bleed easily.  ?Psychiatric/Behavioral:  Negative for depression. The patient is not nervous/anxious and does not have insomnia.   ? ? ?MEDICAL HISTORY:  ?Past Medical History:  ?Diagnosis Date  ? Family history of colon cancer in mother   ? Family history of ovarian cancer 08/2021  ? cancer genetic testing letter sent  ? History of kidney stones   ? Hypothyroidism   ? Renal disorder   ? kidney stones  ? ? ?SURGICAL HISTORY: ?Past Surgical History:  ?Procedure Laterality Date  ? CESAREAN SECTION    ? CHOLECYSTECTOMY    ? COLONOSCOPY WITH PROPOFOL N/A 02/26/2022  ? Procedure: COLONOSCOPY WITH PROPOFOL;  Surgeon: Lucilla Lame, MD;  Location[facility] ENDOSCOPY;  Service: Endoscopy;  Laterality: N/A;  ? CYSTOSCOPY  09/23/2021  ? Procedure: CYSTOSCOPY;  Surgeon: Gae Dry, MD;  Location: [facility] ORS;  Service: Gynecology;;  ? GALLBLADDER SURGERY    ? LAPAROSCOPIC BILATERAL SALPINGECTOMY Bilateral 09/23/2021  ? Procedure: LAPAROSCOPIC BILATERAL SALPINGECTOMY;  Surgeon: Gae Dry, MD;  Location: [facility] ORS;  Service: Gynecology;  Laterality: Bilateral;  ? LAPAROSCOPIC SUPRACERVICAL HYSTERECTOMY  09/23/2021  ? Procedure: LAPAROSCOPIC SUPRACERVICAL HYSTERECTOMY WITH ABLATION OF ENDOMETRIAL CELLS;  Surgeon: Gae Dry, MD;  Location: [facility] ORS;  Service: Gynecology;;  ? ? ?SOCIAL HISTORY: ?Social History  ? ?Socioeconomic History  ? Marital status: Significant Other  ?  Spouse name: Not on file  ? Number of children: 1  ? Years of education: Not on file  ? Highest education level: Not on file  ?Occupational History  ? Not on file  ?Tobacco Use  ? Smoking status: Never  ? Smokeless tobacco: Never  ?Vaping Use  ? Vaping Use: Never used  ?Substance and Sexual Activity  ? Alcohol use: No  ? Drug use: Never  ? Sexual activity: Yes  ?  Other Topics Concern  ? Not on file  ?Social History Narrative  ? 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     ? ?Social Determinants of Health  ? ?Financial Resource Strain: Not on file  ?Food Insecurity: Not on file  ?Transportation Needs: Not on file  ?Physical Activity: Not on file  ?Stress: Not on file  ?Social Connections: Not on file  ?Intimate Partner Violence: Not on file  ? ? ?FAMILY HISTORY: ?Family History  ?Problem Relation Age of Onset  ? Colon cancer Mother 59  ? Hypertension Mother   ? Hyperlipidemia Father   ? Hypertension Father   ? Stroke Father   ? Healthy Son   ? Healthy Son   ? Ovarian cancer Maternal Aunt   ? Breast cancer Maternal Grandmother   ? Cancer Maternal Grandmother   ?     Gallbladder Cancer  ? Lung cancer Maternal Grandmother   ? Hypertension Maternal Grandfather   ? Heart failure Paternal Grandmother   ? Tuberculosis Paternal Grandfather   ? Cancer - Other Maternal Great-grandmother   ?     Gallbladder Cancer  ? ? ?ALLERGIES:  has No Known Allergies. ? ?MEDICATIONS:  ?Current Outpatient Medications  ?Medication Sig Dispense Refill  ? levothyroxine (SYNTHROID) 50 MCG tablet TAKE 1 TABLET BY MOUTH EVERY DAY 90 tablet 1  ? Vibegron (GEMTESA) 75 MG TABS Take 75 mg by mouth daily. 14 tablet 0  ? ?No current facility-administered medications for this visit.  ? ?Facility-Administered Medications Ordered in Other Visits  ?Medication Dose Route Frequency Provider Last Rate Last Admin  ? 0.9 %  sodium chloride infusion   Intravenous Once Charlaine Dalton R, MD      ? iron sucrose (VENOFER) 200 mg in sodium chloride 0.9 % 100 mL IVPB  200 mg Intravenous Once Cammie Sickle, MD      ? ?  ?. ? ?PHYSICAL EXAMINATION: ? ? ?Vitals:  ? 04/07/22 1423  ?BP: (!) 129/91  ?Pulse: 94  ?Temp: 98.7 ?F (37.1 ?C)  ?SpO2: 100%  ? ?Filed Weights  ? 04/07/22 1423  ?Weight: 176 lb (79.8 kg)  ? ? ?Physical Exam  ? ?LABORATORY DATA:  ?I have reviewed the data as listed ?Lab Results  ?Component Value Date  ? WBC 6.5 04/07/2022  ? HGB 10.3 (L) 04/07/2022  ? HCT 33.4 (L) 04/07/2022  ? MCV 83.1 04/07/2022  ? PLT 179  04/07/2022  ? ?Recent Labs  ?  06/19/21 ?0926 04/07/22 ?1419  ?NA 138 134*  ?K 3.6 3.1*  ?CL 101 103  ?CO2 23 27  ?GLUCOSE 92 103*  ?BUN 9 11  ?CREATININE 0.76 0.66  ?CALCIUM 9.3 8.7*  ?GFRNONAA  --  >60  ?PROT 7.0 7.3  ?ALBUMIN 4.6 3.7  ?AST 17 15  ?ALT 11 11  ?ALKPHOS 40* 39  ?BILITOT 0.5 0.3  ? ? ? ?CT RENAL STONE STUDY ? ?Result Date: 04/01/2022 ?CLINICAL DATA:  Left flank and pelvic pain. EXAM: CT ABDOMEN AND PELVIS WITHOUT CONTRAST TECHNIQUE: Multidetector CT imaging of the abdomen and pelvis was performed following the standard protocol without IV contrast. RADIATION DOSE REDUCTION: This exam was performed according to the departmental dose-optimization program which includes automated exposure control, adjustment of the mA and/or kV according to patient size and/or use of iterative reconstruction technique. COMPARISON:  01/16/2011 FINDINGS: Lower chest: The lung bases are clear of acute process. No pleural effusion or pulmonary lesions. The heart is  normal in size. No pericardial effusion. The distal esophagus and aorta are unremarkable. Hepatobiliary: No hepatic lesions or intrahepatic biliary dilatation. The gallbladder is surgically absent. No common bile duct dilatation. Pancreas: No mass, inflammation or ductal dilatation. Spleen: Normal size.  No focal lesions. Adrenals/Urinary Tract: Adrenal glands are normal. The left kidney demonstrates small scattered renal calculi. No renal lesions or hydroureteronephrosis. The right kidney demonstrates chronic significant hydronephrosis with numerous large renal calculi. There is also moderate to marked right-sided hydroureter down to 2 obstructing calculi at the level of the right mid pelvis. The larger more distal calculus measures a maximum of 12.5 mm and the smaller more proximal calculus measures 8 mm. There was a ureteral calculus in the exact same location on the prior CT scan from 2012 and it measured 5 mm a at time. There is a hyperdense lesion involving  the right kidney. It measures 17 mm on image 31/2 and measures 60 Hounsfield units. This is most consistent with a hemorrhagic cyst. The bladder is unremarkable. Stomach/Bowel: The stomach, duodenum, s

## 2022-04-09 LAB — CULTURE, URINE COMPREHENSIVE

## 2022-04-10 ENCOUNTER — Telehealth: Payer: Self-pay | Admitting: Urology

## 2022-04-10 NOTE — Telephone Encounter (Signed)
Patient called the office today requesting more Gemtesa samples.  She is scheduled for surgery on 5/9 and says that the medication is really helping. ? ?Put 2 weeks worth of Gemtesa samples at the front desk for the patient to pick up. ?

## 2022-04-13 DIAGNOSIS — N200 Calculus of kidney: Secondary | ICD-10-CM

## 2022-04-13 HISTORY — DX: Calculus of kidney: N20.0

## 2022-04-14 ENCOUNTER — Inpatient Hospital Stay: Payer: BC Managed Care – PPO | Attending: Internal Medicine

## 2022-04-14 ENCOUNTER — Encounter
Admission: RE | Admit: 2022-04-14 | Discharge: 2022-04-14 | Disposition: A | Discharge status: Home / Self Care | Payer: BC Managed Care – PPO | Source: Ambulatory Visit | Attending: Urology | Admitting: Urology

## 2022-04-14 ENCOUNTER — Other Ambulatory Visit
Admission: RE | Admit: 2022-04-14 | Discharge: 2022-04-14 | Disposition: A | Discharge status: Home / Self Care | Payer: BC Managed Care – PPO | Source: Ambulatory Visit | Attending: Urology | Admitting: Urology

## 2022-04-14 VITALS — BP 127/87 | HR 83 | Temp 98.0°F

## 2022-04-14 VITALS — Ht 68.0 in | Wt 173.0 lb

## 2022-04-14 DIAGNOSIS — Z79899 Other long term (current) drug therapy: Secondary | ICD-10-CM | POA: Insufficient documentation

## 2022-04-14 DIAGNOSIS — D509 Iron deficiency anemia, unspecified: Secondary | ICD-10-CM | POA: Diagnosis not present

## 2022-04-14 DIAGNOSIS — E876 Hypokalemia: Secondary | ICD-10-CM

## 2022-04-14 DIAGNOSIS — E611 Iron deficiency: Secondary | ICD-10-CM

## 2022-04-14 HISTORY — DX: Anemia, unspecified: D64.9

## 2022-04-14 HISTORY — DX: Other complications of anesthesia, initial encounter: T88.59XA

## 2022-04-14 LAB — POTASSIUM: Potassium: 3.8 mmol/L (ref 3.5–5.1)

## 2022-04-14 MED ORDER — SODIUM CHLORIDE 0.9 % IV SOLN
Freq: Once | INTRAVENOUS | Status: AC
  Filled 2022-04-14: qty 250

## 2022-04-14 MED ORDER — SODIUM CHLORIDE 0.9 % IV SOLN: 200.0000 mg | Freq: Once | INTRAVENOUS | Status: DC

## 2022-04-14 MED ORDER — IRON SUCROSE 20 MG/ML IV SOLN
200.0000 mg | Freq: Once | INTRAVENOUS | Status: AC
  Administered 2022-04-14: 200 mg via INTRAVENOUS
  Filled 2022-04-14: qty 10

## 2022-04-14 NOTE — Patient Instructions (Signed)

## 2022-04-14 NOTE — Patient Instructions (Signed)
Your procedure is scheduled on: 04/21/22 Report to [facility]. To find out your arrival time please call [phone] between 1PM - 3PM on 04/20/22.  Remember: Instructions that are not followed completely may result in serious medical risk, up to and including death, or upon the discretion of your surgeon and anesthesiologist your surgery may need to be rescheduled.     _X__ 1. Do not eat any food or drink any liquids after midnight the night before your procedure.                 No gum chewing or hard candies.   __X__2.  On the morning of surgery brush your teeth with toothpaste and water, you                 may rinse your mouth with mouthwash if you wish.  Do not swallow any              toothpaste of mouthwash.     _X__ 3.  No Alcohol for 24 hours before or after surgery.   _X__ 4.  Do Not Smoke or use e-cigarettes For 24 Hours Prior to Your Surgery.                 Do not use any chewable tobacco products for at least 6 hours prior to                 surgery.  ____  5.  Bring all medications with you on the day of surgery if instructed.   __X__  6.  Notify your doctor if there is any change in your medical condition      (cold, fever, infections).     Do not wear jewelry, make-up, hairpins, clips or nail polish. Do not wear lotions, powders, or perfumes.  Do not shave body hair 48 hours prior to surgery. Men may shave face and neck. Do not bring valuables to the hospital.    [facility] is not responsible for any belongings or valuables.  Contacts, dentures/partials or body piercings may not be worn into surgery. Bring a case for your contacts, glasses or hearing aids, a denture cup will be supplied. Leave your suitcase in the car. After surgery it may be brought to your room. For patients admitted to the hospital, discharge time is determined by your treatment team.   Patients discharged the day of surgery will not be  allowed to drive home.   Please read over the following fact sheets that you were given:     __X__ Take these medicines the morning of surgery with A SIP OF WATER:    1. levothyroxine (SYNTHROID) 50 MCG tablet  2. Vibegron (GEMTESA) 75 MG TABS  3.   4.  5.  6.  ____ Fleet Enema (as directed)   ____ Use CHG Soap/SAGE wipes as directed  ____ Use inhalers on the day of surgery  ____ Stop metformin/Janumet/Farxiga 2 days prior to surgery    ____ Take 1/2 of usual insulin dose the night before surgery. No insulin the morning          of surgery.   ____ Stop Blood Thinners Coumadin/Plavix/Xarelto/Pleta/Pradaxa/Eliquis/Effient/Aspirin  on   Or contact your Surgeon, Cardiologist or Medical Doctor regarding  ability to stop your blood thinners  __X__ Stop Anti-inflammatories 7 days before surgery such as Advil, Ibuprofen, Motrin,  BC or Goodies Powder, Naprosyn, Naproxen, Aleve, Aspirin   May  take Tylenol if needed  __X__ Stop all herbals and supplements, fish oil or vitamins  until after surgery.    ____ Bring C-Pap to the hospital.

## 2022-04-16 ENCOUNTER — Ambulatory Visit (INDEPENDENT_AMBULATORY_CARE_PROVIDER_SITE_OTHER): Payer: BC Managed Care – PPO | Admitting: Urology

## 2022-04-16 ENCOUNTER — Encounter: Payer: Self-pay | Admitting: Urology

## 2022-04-16 VITALS — BP 129/84 | HR 91 | Ht 68.0 in | Wt 173.0 lb

## 2022-04-16 DIAGNOSIS — N201 Calculus of ureter: Secondary | ICD-10-CM | POA: Diagnosis not present

## 2022-04-16 DIAGNOSIS — N261 Atrophy of kidney (terminal): Secondary | ICD-10-CM | POA: Diagnosis not present

## 2022-04-16 DIAGNOSIS — N132 Hydronephrosis with renal and ureteral calculous obstruction: Secondary | ICD-10-CM

## 2022-04-16 DIAGNOSIS — N1330 Unspecified hydronephrosis: Secondary | ICD-10-CM

## 2022-04-16 DIAGNOSIS — N200 Calculus of kidney: Secondary | ICD-10-CM | POA: Diagnosis not present

## 2022-04-16 NOTE — Progress Notes (Signed)
? ?04/16/2022 ?9:22 PM  ? ?Kari Acosta ?03-Mar-1976 ?[id number] ? ?Referring provider: Jon Billings, NP ?[address] ?[place],  [place] [address] ? ?Chief Complaint  ?Patient presents with  ? discuss surgery  ? ? ?HPI: ?Kari Acosta is a 46 y.o. who presents for preop visit. ? ?Initially seen 04/02/2022 with chronic pelvic pain and storage related voiding symptoms ?Since August 2022 complaining of symptoms of frequency, urgency with voiding small amounts and associated with pelvic pressure ?Saw her gynecologist and noted to have a uterine prolapse.  Underwent laparoscopic supracervical hysterectomy and bilateral salpingectomy by Dr. Kenton Kingfisher 09/23/2021 ?Her pelvic symptoms persisted after surgery and no abnormalities on exam ?Was given a trial of gabapentin for possible nerve related pain without improvement. ?Prior history stone disease.  I had seen her in 2012 with ureteral calculus.  She was scheduled for ureteroscopic removal but subsequently passed the stone with symptom resolution.  She states her presenting symptoms at that time were similar to her present symptoms ? ?Stone protocol CT abdomen pelvis performed 03/31/2022 showed marked right hydronephrosis and hydroureter to obstructing ureteral calculi in the pelvis measuring 12 and 8 mm ?She was contacted with the CT results and has elected right ureteroscopy with laser lithotripsy/stone removal and is scheduled for 04/21/2022.  Preoperative urine culture was negative ? ? ?PMH: ?Past Medical History:  ?Diagnosis Date  ? Anemia   ? Complication of anesthesia   ? difficult to wake up after hysterectomy  ? Family history of colon cancer in mother   ? Family history of ovarian cancer 08/2021  ? cancer genetic testing letter sent  ? History of kidney stones   ? Hypothyroidism   ? Renal disorder   ? kidney stones  ? ? ?Surgical History: ?Past Surgical History:  ?Procedure Laterality Date  ? CESAREAN SECTION    ? CHOLECYSTECTOMY    ? COLONOSCOPY WITH PROPOFOL  N/A 02/26/2022  ? Procedure: COLONOSCOPY WITH PROPOFOL;  Surgeon: Lucilla Lame, MD;  Location: [facility] END[facility]COPY;  Service: Endoscopy;  Laterality: N/A;  ? CYSTOSCOPY  09/23/2021  ? Procedure: CYSTOSCOPY;  Surgeon: Gae Dry, MD;  Location: [facility] ORS;  Service: Gynecology;;  ? GALLBLADDER SURGERY    ? LAPAROSCOPIC BILATERAL SALPINGECTOMY Bilateral 09/23/2021  ? Procedure: LAPAROSCOPIC BILATERAL SALPINGECTOMY;  Surgeon: Gae Dry, MD;  Location: [facility] ORS;  Service: Gynecology;  Laterality: Bilateral;  ? LAPAROSCOPIC SUPRACERVICAL HYSTERECTOMY  09/23/2021  ? Procedure: LAPAROSCOPIC SUPRACERVICAL HYSTERECTOMY WITH ABLATION OF ENDOMETRIAL CELLS;  Surgeon: Gae Dry, MD;  Location: [facility] ORS;  Service: Gynecology;;  ? ? ?Home Medications:  ?Allergies as of 04/16/2022   ?No Known Allergies ?  ? ?  ?Medication List  ?  ? ?  ? Accurate as of Apr 16, 2022  9:22 PM. If you have any questions, ask your nurse or doctor.  ?  ?  ? ?  ? ?Gemtesa 75 MG Tabs ?Generic drug: Vibegron ?Take 75 mg by mouth daily. ?  ?levothyroxine 50 MCG tablet ?Commonly known as: SYNTHROID ?TAKE 1 TABLET BY MOUTH EVERY DAY ?  ? ?  ? ? ?Allergies: No Known Allergies ? ?Family History: ?Family History  ?Problem Relation Age of Onset  ? Colon cancer Mother 67  ? Hypertension Mother   ? Hyperlipidemia Father   ? Hypertension Father   ? Stroke Father   ? Healthy Son   ? Healthy Son   ? Ovarian cancer Maternal Aunt   ? Breast cancer Maternal Grandmother   ? Cancer  Maternal Grandmother   ?     Gallbladder Cancer  ? Lung cancer Maternal Grandmother   ? Hypertension Maternal Grandfather   ? Heart failure Paternal Grandmother   ? Tuberculosis Paternal Grandfather   ? Cancer - Other Maternal Great-grandmother   ?     Gallbladder Cancer  ? ? ?Social History:  reports that she has never smoked. She has never used smokeless tobacco. She reports that she does not drink alcohol and does not use drugs. ? ? ?Physical Exam: ?BP 129/84   Pulse 91   Ht '5\' 8"'$   (1.727 m)   Wt 173 lb (78.5 kg)   LMP 09/22/2021 (Approximate)   BMI 26.30 kg/m?   ?Constitutional:  Alert and oriented, No acute distress. ?HEENT: [place] AT, moist mucus membranes.  Trachea midline, no masses. ?Respiratory: Normal respiratory effort, no increased work of breathing. ?Skin: No rashes, bruises or suspicious lesions. ?Neurologic: Grossly intact, no focal deficits, moving all 4 extremities. ?Psychiatric: Normal mood and affect. ? ? ?Assessment & Plan:   ? ?1.  Right ureteral calculi ?Pelvic ureteral calculi measuring 8 and 12 mm with mild hydronephrosis and associated right renal atrophy ?The calculi certainly could be accounting for her symptoms ?Scheduled for ureteroscopic stone removal next week.  The procedure was discussed in detail including potential risks of bleeding, infection/sepsis, ureteral injury and renal injury ?The need for a postoperative ureteral stent was discussed with the possibility of stent pain/symptoms ?She has nonobstructing right renal calculi and will attempt flexible ureteropyeloscopy to clear her calculi ?All questions were answered ? ?2.  Right nephrolithiasis ?As above ? ? ? ?Abbie Sons, MD ? ?[place] ?[address], [address] ?[place],  [address] ?[phone] ? ?

## 2022-04-16 NOTE — H&P (View-Only) (Signed)
04/16/2022 9:22 PM   [place] 24-Oct-1976 [id number]  Referring provider: Jon Billings, NP [address] [place],  [place] [address]  Chief Complaint  Patient presents with   discuss surgery    HPI: Kari Acosta is a 46 y.o. who presents for preop visit.  Initially seen 04/02/2022 with chronic pelvic pain and storage related voiding symptoms Since August 2022 complaining of symptoms of frequency, urgency with voiding small amounts and associated with pelvic pressure Saw her gynecologist and noted to have a uterine prolapse.  Underwent laparoscopic supracervical hysterectomy and bilateral salpingectomy by Dr. Kenton Kingfisher 09/23/2021 Her pelvic symptoms persisted after surgery and no abnormalities on exam Was given a trial of gabapentin for possible nerve related pain without improvement. Prior history stone disease.  I had seen her in 2012 with ureteral calculus.  She was scheduled for ureteroscopic removal but subsequently passed the stone with symptom resolution.  She states her presenting symptoms at that time were similar to her present symptoms  Stone protocol CT abdomen pelvis performed 03/31/2022 showed marked right hydronephrosis and hydroureter to obstructing ureteral calculi in the pelvis measuring 12 and 8 mm She was contacted with the CT results and has elected right ureteroscopy with laser lithotripsy/stone removal and is scheduled for 04/21/2022.  Preoperative urine culture was negative   PMH: Past Medical History:  Diagnosis Date   Anemia    Complication of anesthesia    difficult to wake up after hysterectomy   Family history of colon cancer in mother    Family history of ovarian cancer 08/2021   cancer genetic testing letter sent   History of kidney stones    Hypothyroidism    Renal disorder    kidney stones    Surgical History: Past Surgical History:  Procedure Laterality Date   CESAREAN SECTION     CHOLECYSTECTOMY     COLONOSCOPY WITH PROPOFOL  N/A 02/26/2022   Procedure: COLONOSCOPY WITH PROPOFOL;  Surgeon: Lucilla Lame, MD;  Location: [facility] ENDOSCOPY;  Service: Endoscopy;  Laterality: N/A;   CYSTOSCOPY  09/23/2021   Procedure: CYSTOSCOPY;  Surgeon: Gae Dry, MD;  Location: [facility] ORS;  Service: Gynecology;;   GALLBLADDER SURGERY     LAPAROSCOPIC BILATERAL SALPINGECTOMY Bilateral 09/23/2021   Procedure: LAPAROSCOPIC BILATERAL SALPINGECTOMY;  Surgeon: Gae Dry, MD;  Location: [facility] ORS;  Service: Gynecology;  Laterality: Bilateral;   LAPAROSCOPIC SUPRACERVICAL HYSTERECTOMY  09/23/2021   Procedure: LAPAROSCOPIC SUPRACERVICAL HYSTERECTOMY WITH ABLATION OF ENDOMETRIAL CELLS;  Surgeon: Gae Dry, MD;  Location: [facility] ORS;  Service: Gynecology;;    Home Medications:  Allergies as of 04/16/2022   No Known Allergies      Medication List        Accurate as of Apr 16, 2022  9:22 PM. If you have any questions, ask your nurse or doctor.          Gemtesa 75 MG Tabs Generic drug: Vibegron Take 75 mg by mouth daily.   levothyroxine 50 MCG tablet Commonly known as: SYNTHROID TAKE 1 TABLET BY MOUTH EVERY DAY        Allergies: No Known Allergies  Family History: Family History  Problem Relation Age of Onset   Colon cancer Mother 75   Hypertension Mother    Hyperlipidemia Father    Hypertension Father    Stroke Father    Healthy Son    Healthy Son    Ovarian cancer Maternal Aunt    Breast cancer Maternal Grandmother    Cancer  Maternal Grandmother        Gallbladder Cancer   Lung cancer Maternal Grandmother    Hypertension Maternal Grandfather    Heart failure Paternal Grandmother    Tuberculosis Paternal Grandfather    Cancer - Other Maternal Great-grandmother        Gallbladder Cancer    Social History:  reports that she has never smoked. She has never used smokeless tobacco. She reports that she does not drink alcohol and does not use drugs.   Physical Exam: BP 129/84   Pulse 91   Ht '5\' 8"'$   (1.727 m)   Wt 173 lb (78.5 kg)   LMP 09/22/2021 (Approximate)   BMI 26.30 kg/m   Constitutional:  Alert and oriented, No acute distress. HEENT: [place] AT, moist mucus membranes.  Trachea midline, no masses. Respiratory: Normal respiratory effort, no increased work of breathing. Skin: No rashes, bruises or suspicious lesions. Neurologic: Grossly intact, no focal deficits, moving all 4 extremities. Psychiatric: Normal mood and affect.   Assessment & Plan:    1.  Right ureteral calculi Pelvic ureteral calculi measuring 8 and 12 mm with mild hydronephrosis and associated right renal atrophy The calculi certainly could be accounting for her symptoms Scheduled for ureteroscopic stone removal next week.  The procedure was discussed in detail including potential risks of bleeding, infection/sepsis, ureteral injury and renal injury The need for a postoperative ureteral stent was discussed with the possibility of stent pain/symptoms She has nonobstructing right renal calculi and will attempt flexible ureteropyeloscopy to clear her calculi All questions were answered  2.  Right nephrolithiasis As above    Abbie Sons, MD  [facility] [address] [place], [place] [address] [phone]

## 2022-04-16 NOTE — H&P (View-Only) (Signed)
? ?04/16/2022 ?9:22 PM  ? ?Rayna Sexton ?July 05, 1976 ?[id number] ? ?Referring provider: Jon Billings, NP ?[address] ?[place],  [place] [address] ? ?Chief Complaint  ?Patient presents with  ? discuss surgery  ? ? ?HPI: ?Kari Acosta is a 46 y.o. who presents for preop visit. ? ?Initially seen 04/02/2022 with chronic pelvic pain and storage related voiding symptoms ?Since August 2022 complaining of symptoms of frequency, urgency with voiding small amounts and associated with pelvic pressure ?Saw her gynecologist and noted to have a uterine prolapse.  Underwent laparoscopic supracervical hysterectomy and bilateral salpingectomy by Dr. Kenton Kingfisher 09/23/2021 ?Her pelvic symptoms persisted after surgery and no abnormalities on exam ?Was given a trial of gabapentin for possible nerve related pain without improvement. ?Prior history stone disease.  I had seen her in 2012 with ureteral calculus.  She was scheduled for ureteroscopic removal but subsequently passed the stone with symptom resolution.  She states her presenting symptoms at that time were similar to her present symptoms ? ?Stone protocol CT abdomen pelvis performed 03/31/2022 showed marked right hydronephrosis and hydroureter to obstructing ureteral calculi in the pelvis measuring 12 and 8 mm ?She was contacted with the CT results and has elected right ureteroscopy with laser lithotripsy/stone removal and is scheduled for 04/21/2022.  Preoperative urine culture was negative ? ? ?PMH: ?Past Medical History:  ?Diagnosis Date  ? Anemia   ? Complication of anesthesia   ? difficult to wake up after hysterectomy  ? Family history of colon cancer in mother   ? Family history of ovarian cancer 08/2021  ? cancer genetic testing letter sent  ? History of kidney stones   ? Hypothyroidism   ? Renal disorder   ? kidney stones  ? ? ?Surgical History: ?Past Surgical History:  ?Procedure Laterality Date  ? CESAREAN SECTION    ? CHOLECYSTECTOMY    ? COLONOSCOPY WITH PROPOFOL  N/A 02/26/2022  ? Procedure: COLONOSCOPY WITH PROPOFOL;  Surgeon: Lucilla Lame, MD;  Location: [facility]COPY;  Service: Endoscopy;  Laterality: N/A;  ? CYSTOSCOPY  09/23/2021  ? Procedure: CYSTOSCOPY;  Surgeon: Gae Dry, MD;  Location: [facility] ORS;  Service: Gynecology;;  ? GALLBLADDER SURGERY    ? LAPAROSCOPIC BILATERAL SALPINGECTOMY Bilateral 09/23/2021  ? Procedure: LAPAROSCOPIC BILATERAL SALPINGECTOMY;  Surgeon: Gae Dry, MD;  Location: [facility] ORS;  Service: Gynecology;  Laterality: Bilateral;  ? LAPAROSCOPIC SUPRACERVICAL HYSTERECTOMY  09/23/2021  ? Procedure: LAPAROSCOPIC SUPRACERVICAL HYSTERECTOMY WITH ABLATION OF ENDOMETRIAL CELLS;  Surgeon: Gae Dry, MD;  Location: [facility] ORS;  Service: Gynecology;;  ? ? ?Home Medications:  ?Allergies as of 04/16/2022   ?No Known Allergies ?  ? ?  ?Medication List  ?  ? ?  ? Accurate as of Apr 16, 2022  9:22 PM. If you have any questions, ask your nurse or doctor.  ?  ?  ? ?  ? ?Gemtesa 75 MG Tabs ?Generic drug: Vibegron ?Take 75 mg by mouth daily. ?  ?levothyroxine 50 MCG tablet ?Commonly known as: SYNTHROID ?TAKE 1 TABLET BY MOUTH EVERY DAY ?  ? ?  ? ? ?Allergies: No Known Allergies ? ?Family History: ?Family History  ?Problem Relation Age of Onset  ? Colon cancer Mother 74  ? Hypertension Mother   ? Hyperlipidemia Father   ? Hypertension Father   ? Stroke Father   ? Healthy Son   ? Healthy Son   ? Ovarian cancer Maternal Aunt   ? Breast cancer Maternal Grandmother   ? Cancer  Maternal Grandmother   ?     Gallbladder Cancer  ? Lung cancer Maternal Grandmother   ? Hypertension Maternal Grandfather   ? Heart failure Paternal Grandmother   ? Tuberculosis Paternal Grandfather   ? Cancer - Other Maternal Great-grandmother   ?     Gallbladder Cancer  ? ? ?Social History:  reports that she has never smoked. She has never used smokeless tobacco. She reports that she does not drink alcohol and does not use drugs. ? ? ?Physical Exam: ?BP 129/84   Pulse 91   Ht '5\' 8"'$   (1.727 m)   Wt 173 lb (78.5 kg)   LMP 09/22/2021 (Approximate)   BMI 26.30 kg/m?   ?Constitutional:  Alert and oriented, No acute distress. ?HEENT: [place] AT, moist mucus membranes.  Trachea midline, no masses. ?Respiratory: Normal respiratory effort, no increased work of breathing. ?Skin: No rashes, bruises or suspicious lesions. ?Neurologic: Grossly intact, no focal deficits, moving all 4 extremities. ?Psychiatric: Normal mood and affect. ? ? ?Assessment & Plan:   ? ?1.  Right ureteral calculi ?Pelvic ureteral calculi measuring 8 and 12 mm with mild hydronephrosis and associated right renal atrophy ?The calculi certainly could be accounting for her symptoms ?Scheduled for ureteroscopic stone removal next week.  The procedure was discussed in detail including potential risks of bleeding, infection/sepsis, ureteral injury and renal injury ?The need for a postoperative ureteral stent was discussed with the possibility of stent pain/symptoms ?She has nonobstructing right renal calculi and will attempt flexible ureteropyeloscopy to clear her calculi ?All questions were answered ? ?2.  Right nephrolithiasis ?As above ? ? ? ?Abbie Sons, MD ? ?[facility] ?[address] ?[place], [place] [address] ?[phone] ? ? ?

## 2022-04-20 ENCOUNTER — Inpatient Hospital Stay: Payer: BC Managed Care – PPO

## 2022-04-20 VITALS — BP 115/84 | HR 85 | Temp 97.2°F | Resp 18

## 2022-04-20 DIAGNOSIS — D509 Iron deficiency anemia, unspecified: Secondary | ICD-10-CM | POA: Diagnosis not present

## 2022-04-20 DIAGNOSIS — Z79899 Other long term (current) drug therapy: Secondary | ICD-10-CM | POA: Diagnosis not present

## 2022-04-20 DIAGNOSIS — E611 Iron deficiency: Secondary | ICD-10-CM

## 2022-04-20 MED ORDER — IRON SUCROSE 20 MG/ML IV SOLN
200.0000 mg | Freq: Once | INTRAVENOUS | Status: AC
  Administered 2022-04-20: 200 mg via INTRAVENOUS
  Filled 2022-04-20: qty 10

## 2022-04-20 MED ORDER — SODIUM CHLORIDE 0.9 % IV SOLN
Freq: Once | INTRAVENOUS | Status: AC
  Filled 2022-04-20: qty 250

## 2022-04-20 MED ORDER — SODIUM CHLORIDE 0.9 % IV SOLN: 200.0000 mg | Freq: Once | INTRAVENOUS | Status: DC

## 2022-04-21 ENCOUNTER — Ambulatory Visit
Admission: RE | Admit: 2022-04-21 | Discharge: 2022-04-21 | Disposition: A | Discharge status: Home / Self Care | Payer: BC Managed Care – PPO | Attending: Urology | Admitting: Urology

## 2022-04-21 ENCOUNTER — Ambulatory Visit: Payer: BC Managed Care – PPO | Admitting: Urgent Care

## 2022-04-21 ENCOUNTER — Encounter: Payer: Self-pay | Admitting: Urology

## 2022-04-21 ENCOUNTER — Other Ambulatory Visit: Payer: Self-pay

## 2022-04-21 ENCOUNTER — Inpatient Hospital Stay: Payer: BC Managed Care – PPO

## 2022-04-21 ENCOUNTER — Ambulatory Visit: Payer: BC Managed Care – PPO

## 2022-04-21 ENCOUNTER — Ambulatory Visit: Payer: BC Managed Care – PPO | Admitting: Anesthesiology

## 2022-04-21 ENCOUNTER — Encounter
Admission: RE | Disposition: A | Discharge status: Home / Self Care | Payer: Self-pay | Source: Home / Self Care | Attending: Urology

## 2022-04-21 DIAGNOSIS — E039 Hypothyroidism, unspecified: Secondary | ICD-10-CM | POA: Insufficient documentation

## 2022-04-21 DIAGNOSIS — N201 Calculus of ureter: Secondary | ICD-10-CM

## 2022-04-21 DIAGNOSIS — N200 Calculus of kidney: Secondary | ICD-10-CM

## 2022-04-21 DIAGNOSIS — N202 Calculus of kidney with calculus of ureter: Secondary | ICD-10-CM | POA: Diagnosis not present

## 2022-04-21 DIAGNOSIS — N134 Hydroureter: Secondary | ICD-10-CM | POA: Diagnosis not present

## 2022-04-21 DIAGNOSIS — N1330 Unspecified hydronephrosis: Secondary | ICD-10-CM | POA: Diagnosis not present

## 2022-04-21 HISTORY — PX: CYSTOSCOPY/URETEROSCOPY/HOLMIUM LASER/STENT PLACEMENT: SHX6546

## 2022-04-21 SURGERY — CYSTOSCOPY/URETEROSCOPY/HOLMIUM LASER/STENT PLACEMENT
Anesthesia: General | Site: Ureter | Laterality: Right

## 2022-04-21 MED ORDER — TAMSULOSIN HCL 0.4 MG PO CAPS: 0.4000 mg | ORAL_CAPSULE | Freq: Every day | ORAL | 0 refills | Status: DC

## 2022-04-21 MED ORDER — MIDAZOLAM HCL 2 MG/2ML IJ SOLN
INTRAMUSCULAR | Status: DC | PRN
  Administered 2022-04-21: 2 mg via INTRAVENOUS

## 2022-04-21 MED ORDER — ACETAMINOPHEN 10 MG/ML IV SOLN
INTRAVENOUS | Status: DC | PRN
Start: 2022-04-21 — End: 2022-04-21
  Administered 2022-04-21: 1000 mg via INTRAVENOUS

## 2022-04-21 MED ORDER — ACETAMINOPHEN 10 MG/ML IV SOLN: 1000.0000 mg | Freq: Once | INTRAVENOUS | Status: DC | PRN

## 2022-04-21 MED ORDER — OXYCODONE HCL 5 MG/5ML PO SOLN: 5.0000 mg | Freq: Once | ORAL | Status: AC | PRN

## 2022-04-21 MED ORDER — DEXMEDETOMIDINE HCL IN NACL 200 MCG/50ML IV SOLN
INTRAVENOUS | Status: DC | PRN
  Administered 2022-04-21: 12 ug via INTRAVENOUS

## 2022-04-21 MED ORDER — FENTANYL CITRATE (PF) 100 MCG/2ML IJ SOLN: 25.0000 ug | INTRAMUSCULAR | Status: DC | PRN

## 2022-04-21 MED ORDER — SUCCINYLCHOLINE CHLORIDE 200 MG/10ML IV SOSY
PREFILLED_SYRINGE | INTRAVENOUS | Status: DC | PRN
Start: 2022-04-21 — End: 2022-04-21
  Administered 2022-04-21: 100 mg via INTRAVENOUS

## 2022-04-21 MED ORDER — GENTAMICIN SULFATE 40 MG/ML IJ SOLN
5.0000 mg/kg | INTRAVENOUS | Status: AC
  Administered 2022-04-21: 380 mg via INTRAVENOUS
  Filled 2022-04-21: qty 9.5

## 2022-04-21 MED ORDER — MIDAZOLAM HCL 2 MG/2ML IJ SOLN
INTRAMUSCULAR | Status: AC
  Filled 2022-04-21: qty 2

## 2022-04-21 MED ORDER — CHLORHEXIDINE GLUCONATE 0.12 % MT SOLN
OROMUCOSAL | Status: AC
  Administered 2022-04-21: 15 mL via OROMUCOSAL
  Filled 2022-04-21: qty 15

## 2022-04-21 MED ORDER — OXYCODONE-ACETAMINOPHEN 5-325 MG PO TABS: 1.0000 | ORAL_TABLET | Freq: Four times a day (QID) | ORAL | 0 refills | Status: DC | PRN

## 2022-04-21 MED ORDER — ONDANSETRON HCL 4 MG/2ML IJ SOLN: 4.0000 mg | Freq: Once | INTRAMUSCULAR | Status: DC | PRN

## 2022-04-21 MED ORDER — IOHEXOL 180 MG/ML  SOLN
INTRAMUSCULAR | Status: DC | PRN
  Administered 2022-04-21: 30 mL

## 2022-04-21 MED ORDER — SODIUM CHLORIDE 0.9 % IR SOLN
Status: DC | PRN
  Administered 2022-04-21: 3000 mL via INTRAVESICAL

## 2022-04-21 MED ORDER — OXYCODONE HCL 5 MG PO TABS: 5.0000 mg | ORAL_TABLET | Freq: Once | ORAL | Status: AC | PRN

## 2022-04-21 MED ORDER — OXYBUTYNIN CHLORIDE 5 MG PO TABS
5.0000 mg | ORAL_TABLET | Freq: Once | ORAL | Status: AC
  Administered 2022-04-21: 5 mg via ORAL

## 2022-04-21 MED ORDER — PROPOFOL 10 MG/ML IV BOLUS
INTRAVENOUS | Status: DC | PRN
  Administered 2022-04-21: 200 mg via INTRAVENOUS

## 2022-04-21 MED ORDER — OXYCODONE HCL 5 MG PO TABS
ORAL_TABLET | ORAL | Status: AC
  Administered 2022-04-21: 5 mg via ORAL
  Filled 2022-04-21: qty 1

## 2022-04-21 MED ORDER — ORAL CARE MOUTH RINSE: 15.0000 mL | Freq: Once | OROMUCOSAL | Status: AC

## 2022-04-21 MED ORDER — ONDANSETRON HCL 4 MG/2ML IJ SOLN
INTRAMUSCULAR | Status: DC | PRN
Start: 2022-04-21 — End: 2022-04-21
  Administered 2022-04-21: 4 mg via INTRAVENOUS

## 2022-04-21 MED ORDER — DEXAMETHASONE SODIUM PHOSPHATE 10 MG/ML IJ SOLN
INTRAMUSCULAR | Status: DC | PRN
  Administered 2022-04-21: 10 mg via INTRAVENOUS

## 2022-04-21 MED ORDER — LACTATED RINGERS IV SOLN: INTRAVENOUS | Status: DC

## 2022-04-21 MED ORDER — ROCURONIUM BROMIDE 100 MG/10ML IV SOLN
INTRAVENOUS | Status: DC | PRN
  Administered 2022-04-21 (×3): 10 mg via INTRAVENOUS
  Administered 2022-04-21: 40 mg via INTRAVENOUS

## 2022-04-21 MED ORDER — OXYBUTYNIN CHLORIDE 5 MG PO TABS: ORAL_TABLET | ORAL | 0 refills | Status: DC

## 2022-04-21 MED ORDER — EPHEDRINE SULFATE (PRESSORS) 50 MG/ML IJ SOLN
INTRAMUSCULAR | Status: DC | PRN
  Administered 2022-04-21 (×2): 5 mg via INTRAVENOUS
  Administered 2022-04-21: 10 mg via INTRAVENOUS

## 2022-04-21 MED ORDER — GLYCOPYRROLATE 0.2 MG/ML IJ SOLN
INTRAMUSCULAR | Status: DC | PRN
  Administered 2022-04-21: .2 mg via INTRAVENOUS

## 2022-04-21 MED ORDER — OXYBUTYNIN CHLORIDE 5 MG PO TABS
ORAL_TABLET | ORAL | Status: AC
  Filled 2022-04-21: qty 1

## 2022-04-21 MED ORDER — LIDOCAINE HCL (CARDIAC) PF 100 MG/5ML IV SOSY
PREFILLED_SYRINGE | INTRAVENOUS | Status: DC | PRN
  Administered 2022-04-21: 100 mg via INTRAVENOUS

## 2022-04-21 MED ORDER — FAMOTIDINE 20 MG PO TABS: 20.0000 mg | ORAL_TABLET | Freq: Once | ORAL | Status: AC

## 2022-04-21 MED ORDER — FENTANYL CITRATE (PF) 100 MCG/2ML IJ SOLN
INTRAMUSCULAR | Status: AC
  Filled 2022-04-21: qty 2

## 2022-04-21 MED ORDER — VASOPRESSIN 20 UNIT/ML IV SOLN
INTRAVENOUS | Status: DC | PRN
  Administered 2022-04-21 (×2): 2 [IU] via INTRAVENOUS
  Administered 2022-04-21: 1 [IU] via INTRAVENOUS

## 2022-04-21 MED ORDER — PHENYLEPHRINE 80 MCG/ML (10ML) SYRINGE FOR IV PUSH (FOR BLOOD PRESSURE SUPPORT)
PREFILLED_SYRINGE | INTRAVENOUS | Status: DC | PRN
  Administered 2022-04-21 (×2): 80 ug via INTRAVENOUS

## 2022-04-21 MED ORDER — FAMOTIDINE 20 MG PO TABS
ORAL_TABLET | ORAL | Status: AC
  Administered 2022-04-21: 20 mg via ORAL
  Filled 2022-04-21: qty 1

## 2022-04-21 MED ORDER — CHLORHEXIDINE GLUCONATE 0.12 % MT SOLN: 15.0000 mL | Freq: Once | OROMUCOSAL | Status: AC

## 2022-04-21 MED ORDER — SUGAMMADEX SODIUM 200 MG/2ML IV SOLN
INTRAVENOUS | Status: DC | PRN
  Administered 2022-04-21: 180 mg via INTRAVENOUS

## 2022-04-21 MED ORDER — FENTANYL CITRATE (PF) 100 MCG/2ML IJ SOLN
INTRAMUSCULAR | Status: DC | PRN
  Administered 2022-04-21: 25 ug via INTRAVENOUS
  Administered 2022-04-21: 50 ug via INTRAVENOUS
  Administered 2022-04-21: 25 ug via INTRAVENOUS

## 2022-04-21 SURGICAL SUPPLY — 33 items
BAG DRAIN CYSTO-URO LG1000N (MISCELLANEOUS) ×2 IMPLANT
BAG PRESSURE INF REUSE 3000 (BAG) ×1 IMPLANT
BASKET ZERO TIP 1.9FR (BASKET) IMPLANT
BRUSH SCRUB EZ 1% IODOPHOR (MISCELLANEOUS) ×2 IMPLANT
BSKT STON RTRVL ZERO TP 1.9FR (BASKET)
CATH URET FLEX-TIP 2 LUMEN 10F (CATHETERS) IMPLANT
CATH URETL 5X70 OPEN END (CATHETERS) ×1 IMPLANT
CATH URETL OPEN END 6FR 70 (CATHETERS) ×1 IMPLANT
CATH URETL OPEN END 6X70 (CATHETERS) IMPLANT
CNTNR SPEC 2.5X3XGRAD LEK (MISCELLANEOUS)
CONT SPEC 4OZ STER OR WHT (MISCELLANEOUS)
CONT SPEC 4OZ STRL OR WHT (MISCELLANEOUS)
CONTAINER SPEC 2.5X3XGRAD LEK (MISCELLANEOUS) IMPLANT
DRAPE UTILITY 15X26 TOWEL STRL (DRAPES) ×2 IMPLANT
FIBER LASER MOSES 200 DFL (Laser) ×1 IMPLANT
GLOVE SURG UNDER POLY LF SZ7.5 (GLOVE) ×2 IMPLANT
GOWN STRL REUS W/ TWL LRG LVL3 (GOWN DISPOSABLE) ×1 IMPLANT
GOWN STRL REUS W/ TWL XL LVL3 (GOWN DISPOSABLE) ×1 IMPLANT
GOWN STRL REUS W/TWL LRG LVL3 (GOWN DISPOSABLE) ×2
GOWN STRL REUS W/TWL XL LVL3 (GOWN DISPOSABLE) ×2
GUIDEWIRE STR DUAL SENSOR (WIRE) ×2 IMPLANT
GUIDEWIRE STR ZIPWIRE 035X150 (MISCELLANEOUS) ×1 IMPLANT
IV NS IRRIG 3000ML ARTHROMATIC (IV SOLUTION) ×3 IMPLANT
KIT TURNOVER CYSTO (KITS) ×2 IMPLANT
PACK CYSTO AR (MISCELLANEOUS) ×2 IMPLANT
SET CYSTO W/LG BORE CLAMP LF (SET/KITS/TRAYS/PACK) ×2 IMPLANT
SHEATH NAVIGATOR HD 12/14X36 (SHEATH) IMPLANT
STENT URET 6FRX24 CONTOUR (STENTS) ×1 IMPLANT
STENT URET 6FRX26 CONTOUR (STENTS) IMPLANT
SURGILUBE 2OZ TUBE FLIPTOP (MISCELLANEOUS) ×2 IMPLANT
TRACTIP FLEXIVA PULSE ID 200 (Laser) ×2 IMPLANT
VALVE UROSEAL ADJ ENDO (VALVE) IMPLANT
WATER STERILE IRR 500ML POUR (IV SOLUTION) ×2 IMPLANT

## 2022-04-21 NOTE — Anesthesia Procedure Notes (Signed)
Procedure Name: Intubation ?Date/Time: 04/21/2022 5:18 PM ?Performed by: Kelton Pillar, CRNA ?Pre-anesthesia Checklist: Patient identified, Emergency Drugs available, Suction available and Patient being monitored ?Patient Re-evaluated:Patient Re-evaluated prior to induction ?Oxygen Delivery Method: Circle system utilized ?Preoxygenation: Pre-oxygenation with 100% oxygen ?Induction Type: IV induction ?Ventilation: Mask ventilation without difficulty ?Laryngoscope Size: McGraph and 3 ?Grade View: Grade I ?Tube type: Oral ?Tube size: 6.5 mm ?Number of attempts: 1 ?Airway Equipment and Method: Stylet and Oral airway ?Placement Confirmation: ETT inserted through vocal cords under direct vision, positive ETCO2, breath sounds checked- equal and bilateral and CO2 detector ?Secured at: 21 cm ?Tube secured with: Tape ?Dental Injury: Teeth and Oropharynx as per pre-operative assessment  ? ? ? ? ?

## 2022-04-21 NOTE — Transfer of Care (Signed)
Immediate Anesthesia Transfer of Care Note ? ?Patient: Kari Acosta ? ?Procedure(s) Performed: CYSTOSCOPY/URETEROSCOPY/HOLMIUM LASER/STENT PLACEMENT (Right: Ureter) ? ?Patient Location: PACU ? ?Anesthesia Type:General ? ?Level of Consciousness: sedated ? ?Airway & Oxygen Therapy: Patient Spontanous Breathing and Patient connected to face mask oxygen ? ?Post-op Assessment: Report given to RN and Post -op Vital signs reviewed and stable ? ?Post vital signs: Reviewed and stable ? ?Last Vitals:  ?Vitals Value Taken Time  ?BP    ?Temp    ?Pulse 77 04/21/22 2006  ?Resp 16 04/21/22 2006  ?SpO2 100 % 04/21/22 2006  ?Vitals shown include unvalidated device data. ? ?Last Pain:  ?Vitals:  ? 04/21/22 1241  ?TempSrc: Oral  ?PainSc: 0-No pain  ?   ? ?  ? ?Complications: No notable events documented. ?

## 2022-04-21 NOTE — Anesthesia Preprocedure Evaluation (Signed)
Anesthesia Evaluation  ?Patient identified by MRN, date of birth, ID band ?Patient awake ? ? ? ?Reviewed: ?Allergy & Precautions, NPO status , Patient's Chart, lab work & pertinent test results ? ?History of Anesthesia Complications ?(+) PROLONGED EMERGENCE and history of anesthetic complications ? ?Airway ?Mallampati: II ? ?TM Distance: >3 FB ?Neck ROM: Full ? ? ? Dental ? ?(+) Dental Advidsory Given, Teeth Intact ?  ?Pulmonary ?neg pulmonary ROS, neg shortness of breath, neg sleep apnea, neg COPD, neg recent URI,  ?  ?breath sounds clear to auscultation- rhonchi ?(-) wheezing ? ? ? ? ? Cardiovascular ?Exercise Tolerance: Good ?(-) hypertension(-) angina(-) CAD, (-) Past MI, (-) Cardiac Stents and (-) CABG  ?Rhythm:Regular Rate:Normal ?- Systolic murmurs and - Diastolic murmurs ? ?  ?Neuro/Psych ?neg Seizures negative neurological ROS ? negative psych ROS  ? GI/Hepatic ?negative GI ROS, Neg liver ROS,   ?Endo/Other  ?neg diabetesHypothyroidism  ? Renal/GU ?Renal disease (kidney stone)  ? ?  ?Musculoskeletal ?negative musculoskeletal ROS ?(+)  ? Abdominal ?(+) - obese,   ?Peds ? Hematology ?negative hematology ROS ?(+)   ?Anesthesia Other Findings ?Past Medical History: ?No date: Family history of colon cancer in mother ?08/2021: Family history of ovarian cancer ?    Comment:  cancer genetic testing letter sent ?No date: History of kidney stones ?No date: Hypothyroidism ?No date: Renal disorder ?    Comment:  kidney stones ? ? Reproductive/Obstetrics ? ?  ? ? ? ? ? ? ? ? ? ? ? ? ? ?  ?  ? ? ? ? ? ? ? ? ?Anesthesia Physical ? ?Anesthesia Plan ? ?ASA: 2 ? ?Anesthesia Plan: General  ? ?Post-op Pain Management:   ? ?Induction: Intravenous ? ?PONV Risk Score and Plan: 2 and Ondansetron, Dexamethasone, Midazolam and Treatment may vary due to age or medical condition ? ?Airway Management Planned: Oral ETT and LMA ? ?Additional Equipment:  ? ?Intra-op Plan:  ? ?Post-operative Plan:  Extubation in OR ? ?Informed Consent: I have reviewed the patients History and Physical, chart, labs and discussed the procedure including the risks, benefits and alternatives for the proposed anesthesia with the patient or authorized representative who has indicated his/her understanding and acceptance.  ? ? ? ?Dental advisory given ? ?Plan Discussed with: CRNA and Anesthesiologist ? ?Anesthesia Plan Comments:   ? ? ? ? ? ? ?Anesthesia Quick Evaluation ? ?

## 2022-04-21 NOTE — Op Note (Signed)
Preoperative diagnosis:  ?Right distal ureteral calculi ?Right nephrolithiasis ? ?Postoperative diagnosis:  ?Same ? ?Procedure: ? ?Cystoscopy ?Right ureteroscopy and stone removal ?Ureteroscopic laser lithotripsy ?Right ureteral stent placement (36F/24 cm)  ?Right retrograde pyelography with interpretation ?Intraoperative fluoroscopy (< 60 min) ? ?Surgeon: Ronda Fairly. Onell Mcmath, M.D. ? ?Anesthesia: General ? ?Complications: None ? ?Intraoperative findings:  ?Cystoscopy-bladder mucosa normal in appearance without erythema, solid or papillary lesions.  UOs normal-appearing bilaterally ?Right ureteroscopy-impacted distal ureteral calculi with marked inflammatory change obscuring bleeding calculus ?Right retrograde pyelogram-severe right hydronephrosis/hydroureter no extravasation noted post procedure ? ?EBL: Minimal ? ?Specimens: ?None ? ? ?Indication: Kari Acosta is a 46 y.o. 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  She was found to have 8 and 12 mm right distal ureteral calculi with severe hydronephrosis and hydroureter.  Nonobstructing renal calculi were noted.  Refer to H&P for details.  After reviewing the management options for treatment, the patient elected to proceed with the above surgical procedure(s). We have discussed the potential benefits and risks of the procedure, side effects of the proposed treatment, the likelihood of the patient achieving the goals of the procedure, and any potential problems that might occur during the procedure or recuperation. Informed consent has been obtained. ? ?Description of procedure: ? ?The patient was taken to the operating room and general anesthesia was induced.  The patient was placed in the dorsal lithotomy position, prepped and draped in the usual sterile fashion, and preoperative antibiotics were administered. A preoperative time-out was performed.  ? ?A 21 French cystoscope was lubricated and passed per urethra.  Panendoscopy was  performed with findings as described above. ? ?A 0.038 Sensor wire was placed through the cystoscope and into the right ureteral orifice.  The guidewire could not be advanced beyond the calculus.  Attempts at a Zip-wire were also unsuccessful. ? ?The cystoscope was removed and a 4.5 French semirigid ureteroscope was passed per urethra and into the right UO without difficulty and advanced to the level of the stone which was barely visualized due to inflammatory changes of the mucosa.  An opening could not be seen around the stone and gentle probing of the area with a guidewire was not successful.  Retrograde pyelogram was performed through the ureteroscope and no contrast was seen proximal to the calculi. ? ?A 200 ?m Moses laser fiber was placed through the ureteroscope and careful dusting of the calculus was commenced at a setting of 0.3J/80 Hz.  Working towards the medial aspect of stone the mucosa was identified with a small opening.  The Zip wire was able to be advanced proximal to the calculi and to the region of the proximal ureter.  Due to stone impaction a ureteral catheter could not be advanced over the Zip wire and the hydrophilic wire could not be exchange for the Sensor wire. ? ?To aid in visualization the 4.5 French semirigid ureteroscope was exchanged for a 6.5 [place] dual channel semirigid ureteroscope.  Dusting was continued at a setting of 0.3J/120 Hz.  This portion of the procedure was time-consuming secondary to stone hardness and stone impaction. ? ?The distal calculus was completely dusted/fragmented and the adjacent 8 mm calculus was identified and dusted in similar fashion. ? ?The ureteroscope was able to be advanced into the ureter proximal to the calculi which was markedly dilated and tortuous.  The Zip wire was removed and readvanced through the ureteroscope.  Tortuosity of the ureter was noted however was able to be straightened with  the wire. ? ?At this point 2 hours of anesthesia time had  lapsed and it was elected to place a ureteral stent and not attempt to remove the renal calculi. She will be scheduled for a staged procedure and it was also felt a second look at the site of the ureteral calculi would be beneficial. ? ?The ureteroscope was removed.  A 6 French open-ended catheter was placed over the Zip wire to the renal pelvis.  Retrograde pyelogram was performed with findings as described above. ? ?A Sensor wire was then placed through the ureteral catheter and the catheter was removed.  A 10F/24 cm Contour ureteral stent was placed on fluoroscopic guidance with a good curl seen in the renal pelvis and bladder under fluoroscopy. ? ?The bladder was then emptied and the procedure ended.  The patient appeared to tolerate the procedure well and without complications.  After anesthetic reversal the patient was transported to the PACU in stable condition.  ? ? ?Plan: ?Staged/second look ureteroscopy will be completed in ~ 3 weeks ? ? ?John Giovanni, MD ? ?

## 2022-04-21 NOTE — Interval H&P Note (Signed)
History and Physical Interval Note: ? ?CV:RRR ?Lungs:clear ? ?04/21/2022 ?5:12 PM ? ?Kari Acosta  has presented today for surgery, with the diagnosis of Right Ureteral Stone, Right Nephrolithiasis.  The various methods of treatment have been discussed with the patient and family. After consideration of risks, benefits and other options for treatment, the patient has consented to  Procedure(s): ?CYSTOSCOPY/URETEROSCOPY/HOLMIUM LASER/STENT PLACEMENT (Right) as a surgical intervention.  The patient's history has been reviewed, patient examined, no change in status, stable for surgery.  I have reviewed the patient's chart and labs.  Questions were answered to the patient's satisfaction.   ? ? ?Jaylanie Boschee C Thurmond Hildebran ? ? ?

## 2022-04-21 NOTE — Anesthesia Preprocedure Evaluation (Deleted)
Anesthesia Evaluation  ? ? ?Airway ? ? ? ? ? ? ? Dental ?  ?Pulmonary ? ?  ? ? ? ? ? ? ? Cardiovascular ? ? ? ?  ?Neuro/Psych ?  ? GI/Hepatic ?  ?Endo/Other  ?Hypothyroidism  ? Renal/GU ?Renal disease (nephrolithiasis)  ? ?  ?Musculoskeletal ? ? Abdominal ?  ?Peds ? Hematology ? ?(+) Blood dyscrasia, anemia ,   ?Anesthesia Other Findings ? ? Reproductive/Obstetrics ? ?  ? ? ? ? ? ? ? ? ? ? ? ? ? ?  ?  ? ? ? ? ? ? ? ? ?Anesthesia Physical ?Anesthesia Plan ? ?ASA: 2 ? ?Anesthesia Plan: General  ? ?Post-op Pain Management:   ? ?Induction: Intravenous ? ?PONV Risk Score and Plan: 3 and Ondansetron, Dexamethasone and Treatment may vary due to age or medical condition ? ?Airway Management Planned:  ? ?Additional Equipment:  ? ?Intra-op Plan:  ? ?Post-operative Plan:  ? ?Informed Consent:  ? ?Plan Discussed with:  ? ?Anesthesia Plan Comments:   ? ? ? ? ? ? ?Anesthesia Quick Evaluation ? ?

## 2022-04-21 NOTE — Discharge Instructions (Addendum)
DISCHARGE INSTRUCTIONS FOR KIDNEY STONE/URETERAL STENT  ? ?MEDICATIONS:  ?1. Resume all your other meds from home.  ?2.  AZO (over-the-counter) can help with the burning/stinging when you urinate. ?3.  Oxycodone is for moderate/severe pain, Rx was sent to your pharmacy. ?4. Tamsulosin and oxybutynin are for stent/bladder irritation.  Rxs were sent to your pharmacy ? ?ACTIVITY:  ?1. May resume regular activities in 24 hours. ?2. No driving while on narcotic pain medications  ?3. Drink plenty of water  ?4. Continue to walk at home - you can still get blood clots when you are at home, so keep active, but don't over do it.  ?5. May return to work/school tomorrow or when you feel ready  ? ? ?SIGNS/SYMPTOMS TO CALL:  ?Common postoperative symptoms include urinary frequency, urgency, bladder spasm and blood in the urine ? ?Please call us if you have a fever greater than 101.5, uncontrolled nausea/vomiting, uncontrolled pain, dizziness, unable to urinate, excessively bloody urine, chest pain, shortness of breath, leg swelling, leg pain, or any other concerns or questions.  ? ?You can reach [place] at [phone].  ? ?FOLLOW-UP:  ?1.  Dr. Bernardo Heater will contact you tomorrow to go over your procedure ? ?AMBULATORY SURGERY  ?DISCHARGE INSTRUCTIONS ? ? ?The drugs that you were given will stay in your system until tomorrow so for the next 24 hours you should not: ? ?Drive an automobile ?Make any legal decisions ?Drink any alcoholic beverage ? ? ?You may resume regular meals tomorrow.  Today it is better to start with liquids and gradually work up to solid foods. ? ?You may eat anything you prefer, but it is better to start with liquids, then soup and crackers, and gradually work up to solid foods. ? ? ?Please notify your doctor immediately if you have any unusual bleeding, trouble breathing, redness and pain at the surgery site, drainage, fever, or pain not relieved by medication. ?  ? ?Your post-operative visit with Dr.                      ? ? ?           ?     is: Date:                        Time:   ? ?Please call to schedule your post-operative visit. ? ?Additional Instructions:  ?

## 2022-04-22 ENCOUNTER — Encounter: Payer: Self-pay | Admitting: Urology

## 2022-04-22 NOTE — Anesthesia Postprocedure Evaluation (Signed)
Anesthesia Post Note ? ?Patient: Kari Acosta ? ?Procedure(s) Performed: CYSTOSCOPY/URETEROSCOPY/HOLMIUM LASER/STENT PLACEMENT (Right: Ureter) ? ?Patient location during evaluation: PACU ?Anesthesia Type: General ?Level of consciousness: awake and alert ?Pain management: pain level controlled ?Vital Signs Assessment: post-procedure vital signs reviewed and stable ?Respiratory status: spontaneous breathing, nonlabored ventilation, respiratory function stable and patient connected to nasal cannula oxygen ?Cardiovascular status: blood pressure returned to baseline and stable ?Postop Assessment: no apparent nausea or vomiting ?Anesthetic complications: no ? ? ?No notable events documented. ? ? ?Last Vitals:  ?Vitals:  ? 04/21/22 2045 04/21/22 2057  ?BP: 112/72   ?Pulse: 73 69  ?Resp: 16 20  ?Temp: (!) 36.4 ?C (!) 36.4 ?C  ?SpO2: 100%   ?  ?Last Pain:  ?Vitals:  ? 04/21/22 2057  ?TempSrc: Temporal  ?PainSc: 2   ? ? ?  ?  ?  ?  ?  ?  ? ?Martha Clan ? ? ? ? ?

## 2022-04-28 ENCOUNTER — Inpatient Hospital Stay: Payer: BC Managed Care – PPO

## 2022-04-28 ENCOUNTER — Other Ambulatory Visit: Payer: BC Managed Care – PPO

## 2022-04-28 ENCOUNTER — Telehealth: Payer: Self-pay

## 2022-04-28 ENCOUNTER — Other Ambulatory Visit: Payer: Self-pay

## 2022-04-28 VITALS — BP 120/79 | HR 93 | Temp 98.4°F | Resp 18

## 2022-04-28 DIAGNOSIS — N201 Calculus of ureter: Secondary | ICD-10-CM

## 2022-04-28 DIAGNOSIS — Z79899 Other long term (current) drug therapy: Secondary | ICD-10-CM | POA: Diagnosis not present

## 2022-04-28 DIAGNOSIS — N200 Calculus of kidney: Secondary | ICD-10-CM

## 2022-04-28 DIAGNOSIS — D509 Iron deficiency anemia, unspecified: Secondary | ICD-10-CM | POA: Diagnosis not present

## 2022-04-28 DIAGNOSIS — E611 Iron deficiency: Secondary | ICD-10-CM

## 2022-04-28 MED ORDER — IRON SUCROSE 20 MG/ML IV SOLN
200.0000 mg | Freq: Once | INTRAVENOUS | Status: AC
  Administered 2022-04-28: 200 mg via INTRAVENOUS
  Filled 2022-04-28: qty 10

## 2022-04-28 MED ORDER — SODIUM CHLORIDE 0.9 % IV SOLN: 200.0000 mg | Freq: Once | INTRAVENOUS | Status: DC

## 2022-04-28 MED ORDER — SODIUM CHLORIDE 0.9 % IV SOLN
Freq: Once | INTRAVENOUS | Status: AC
  Filled 2022-04-28: qty 250

## 2022-04-28 NOTE — Progress Notes (Signed)
[facility] Posting Form  ? ?Surgery Date/Time: Date: 05/05/2022 ? ?Surgeon: Dr. John Giovanni, MD ? ?Surgery Location: Day Surgery ? ?Inpt ( No  )   Outpt (Yes)   Obs ( No  )  ? ?Diagnosis: N20.1 Right Ureteral Stone, N20.0 Right Nephrolithiasis ? ?-CPT: 94854 ? ?Surgery: Right Ureteroscopy with laser lithotripsy and stent exchange ? ?Stop Anticoagulations: N/A ? ?Cardiac/Medical/Pulmonary Clearance needed: no ? ?*Orders entered into EPIC  Date: 04/28/22  ? ?*Case booked in [place]  Date: 04/28/22 ? ?*Notified pt of Surgery: Date: 04/28/22 ? ?PRE-OP UA & CX: yes, will obtain today 04/28/2022 ? ?*Placed into Prior Authorization Work Fabio Bering Date: 04/28/22 ? ? ?Assistant/laser/rep:No ? ? ? ? ? ? ? ? ? ? ? ? ? ? ? ?

## 2022-04-28 NOTE — Telephone Encounter (Signed)
I spoke with Kari Acosta. We have discussed possible surgery dates and Tuesday May 23rd, 2023 was agreed upon by all parties. Patient given information about surgery date, what to expect pre-operatively and post operatively.  ?We discussed that a Pre-Admission Testing office will be calling to set up the pre-op visit that will take place prior to surgery, and that these appointments are typically done over the phone with a Pre-Admissions RN.  ?Informed patient that our office will communicate any additional care to be provided after surgery. Patients questions or concerns were discussed during our call. Advised to call our office should there be any additional information, questions or concerns that arise. Patient verbalized understanding.  ? ?

## 2022-04-28 NOTE — Patient Instructions (Signed)

## 2022-04-29 LAB — MICROSCOPIC EXAMINATION: RBC, Urine: >30 /hpf — AB (ref 0–2)

## 2022-04-29 LAB — URINALYSIS, COMPLETE
Bilirubin, UA: NEGATIVE
Glucose, UA: NEGATIVE
Ketones, UA: NEGATIVE
Nitrite, UA: NEGATIVE
Specific Gravity, UA: >1.03 — ABNORMAL HIGH (ref 1.005–1.030)
Urobilinogen, Ur: 0.2 mg/dL (ref 0.2–1.0)
pH, UA: 6.5 (ref 5.0–7.5)

## 2022-04-30 ENCOUNTER — Encounter
Admission: RE | Admit: 2022-04-30 | Discharge: 2022-04-30 | Disposition: A | Discharge status: Home / Self Care | Payer: BC Managed Care – PPO | Source: Ambulatory Visit | Attending: Urology | Admitting: Urology

## 2022-04-30 NOTE — Patient Instructions (Addendum)
Your procedure is scheduled on: Tuesday, May 23 Report to the Registration Desk on the 1st floor of the [facility]. To find out your arrival time, please call [phone] between 1PM - 3PM on: Monday, May 22 If your arrival time is 6:00 am, do not arrive prior to that time as the [place] entrance doors do not open until 6:00 am.  REMEMBER: Instructions that are not followed completely may result in serious medical risk, up to and including death; or upon the discretion of your surgeon and anesthesiologist your surgery may need to be rescheduled.  Do not eat or drink after midnight the night before surgery.  No gum chewing, lozengers or hard candies.  TAKE THESE MEDICATIONS THE MORNING OF SURGERY WITH A SIP OF WATER:  Levothyroxine Tamsulosin (Flomax) Vibegron (gemtesa)  One week prior to surgery: starting May 16 Stop Anti-inflammatories (NSAIDS) such as Advil, Aleve, Ibuprofen, Motrin, Naproxen, Naprosyn and Aspirin based products such as Excedrin, Goodys Powder, BC Powder. Stop ANY OVER THE COUNTER supplements until after surgery. You may however, continue to take Tylenol if needed for pain up until the day of surgery.  No Alcohol for 24 hours before or after surgery.  No Smoking including e-cigarettes for 24 hours prior to surgery.  No chewable tobacco products for at least 6 hours prior to surgery.  No nicotine patches on the day of surgery.  Do not use any "recreational" drugs for at least a week prior to your surgery.  Please be advised that the combination of cocaine and anesthesia may have negative outcomes, up to and including death. If you test positive for cocaine, your surgery will be cancelled.  On the morning of surgery brush your teeth with toothpaste and water, you may rinse your mouth with mouthwash if you wish. Do not swallow any toothpaste or mouthwash.  Do not wear jewelry, make-up, hairpins, clips or nail polish.  Do not wear lotions, powders, or  perfumes.   Do not shave body from the neck down 48 hours prior to surgery just in case you cut yourself which could leave a site for infection.   Contact lenses, hearing aids and dentures may not be worn into surgery.  Do not bring valuables to the hospital. [facility] is not responsible for any missing/lost belongings or valuables.   Notify your doctor if there is any change in your medical condition (cold, fever, infection).  Wear comfortable clothing (specific to your surgery type) to the hospital.  After surgery, you can help prevent lung complications by doing breathing exercises.  Take deep breaths and cough every 1-2 hours. Your doctor may order a device called an Incentive Spirometer to help you take deep breaths.  If you are being discharged the day of surgery, you will not be allowed to drive home. You will need a responsible adult (18 years or older) to drive you home and stay with you that night.   If you are taking public transportation, you will need to have a responsible adult (18 years or older) with you. Please confirm with your physician that it is acceptable to use public transportation.   Please call the [facility] at [phone] if you have any questions about these instructions.  Surgery Visitation Policy:  Patients undergoing a surgery or procedure may have two family members or support persons with them as long as the person is not COVID-19 positive or experiencing its symptoms.

## 2022-05-01 ENCOUNTER — Telehealth: Payer: Self-pay | Admitting: Urology

## 2022-05-01 LAB — CULTURE, URINE COMPREHENSIVE

## 2022-05-01 MED ORDER — OXYCODONE-ACETAMINOPHEN 5-325 MG PO TABS: 1.0000 | ORAL_TABLET | Freq: Four times a day (QID) | ORAL | 0 refills | Status: DC | PRN

## 2022-05-01 NOTE — Telephone Encounter (Signed)
Patient called asking for more pain meds until her surgery on 05/05/22. She said the stent pain is unbearable. Please advise

## 2022-05-04 MED ORDER — ORAL CARE MOUTH RINSE: 15.0000 mL | Freq: Once | OROMUCOSAL | Status: AC

## 2022-05-04 MED ORDER — CHLORHEXIDINE GLUCONATE 0.12 % MT SOLN: 15.0000 mL | Freq: Once | OROMUCOSAL | Status: AC

## 2022-05-04 MED ORDER — GABAPENTIN 600 MG PO TABS
300.0000 mg | ORAL_TABLET | Freq: Once | ORAL | Status: DC
  Filled 2022-05-04: qty 0.5

## 2022-05-04 MED ORDER — GENTAMICIN SULFATE 40 MG/ML IJ SOLN
5.0000 mg/kg | INTRAVENOUS | Status: AC
  Administered 2022-05-05: 390 mg via INTRAVENOUS
  Filled 2022-05-04: qty 9.75

## 2022-05-04 MED ORDER — LACTATED RINGERS IV SOLN: INTRAVENOUS | Status: DC

## 2022-05-04 MED ORDER — ACETAMINOPHEN 500 MG PO TABS: 1000.0000 mg | ORAL_TABLET | Freq: Once | ORAL | Status: AC

## 2022-05-04 MED ORDER — FAMOTIDINE 20 MG PO TABS: 20.0000 mg | ORAL_TABLET | Freq: Once | ORAL | Status: AC

## 2022-05-04 NOTE — Anesthesia Preprocedure Evaluation (Signed)
Anesthesia Evaluation  Patient identified by MRN, date of birth, ID band Patient awake    Reviewed: Allergy & Precautions, NPO status , Patient's Chart, lab work & pertinent test results  History of Anesthesia Complications Negative for: history of anesthetic complications  Airway Mallampati: I   Neck ROM: Full    Dental no notable dental hx.    Pulmonary neg pulmonary ROS,    Pulmonary exam normal breath sounds clear to auscultation       Cardiovascular Exercise Tolerance: Good negative cardio ROS Normal cardiovascular exam Rhythm:Regular Rate:Normal     Neuro/Psych negative neurological ROS     GI/Hepatic negative GI ROS,   Endo/Other  Hypothyroidism   Renal/GU Renal disease (nephrolithiasis)     Musculoskeletal   Abdominal   Peds  Hematology  (+) Blood dyscrasia, anemia ,   Anesthesia Other Findings   Reproductive/Obstetrics                            Anesthesia Physical  Anesthesia Plan  ASA: 2  Anesthesia Plan: General   Post-op Pain Management:    Induction: Intravenous  PONV Risk Score and Plan: 3 and Ondansetron, Dexamethasone and Treatment may vary due to age or medical condition  Airway Management Planned: LMA  Additional Equipment:   Intra-op Plan:   Post-operative Plan: Extubation in OR  Informed Consent: I have reviewed the patients History and Physical, chart, labs and discussed the procedure including the risks, benefits and alternatives for the proposed anesthesia with the patient or authorized representative who has indicated his/her understanding and acceptance.     Dental advisory given  Plan Discussed with: CRNA  Anesthesia Plan Comments: (Patient consented for risks of anesthesia including but not limited to:  - adverse reactions to medications - damage to eyes, teeth, lips or other oral mucosa - nerve damage due to positioning  - sore throat  or hoarseness - damage to heart, brain, nerves, lungs, other parts of body or loss of life  Informed patient about role of CRNA in peri- and intra-operative care.  Patient voiced understanding.)       Anesthesia Quick Evaluation

## 2022-05-05 ENCOUNTER — Ambulatory Visit
Admission: RE | Admit: 2022-05-05 | Discharge: 2022-05-05 | Disposition: A | Discharge status: Home / Self Care | Payer: BC Managed Care – PPO | Attending: Urology | Admitting: Urology

## 2022-05-05 ENCOUNTER — Encounter: Payer: Self-pay | Admitting: Urology

## 2022-05-05 ENCOUNTER — Ambulatory Visit: Payer: BC Managed Care – PPO | Admitting: Anesthesiology

## 2022-05-05 ENCOUNTER — Other Ambulatory Visit: Payer: Self-pay | Admitting: Urology

## 2022-05-05 ENCOUNTER — Encounter
Admission: RE | Disposition: A | Discharge status: Home / Self Care | Payer: Self-pay | Source: Home / Self Care | Attending: Urology

## 2022-05-05 ENCOUNTER — Ambulatory Visit: Payer: BC Managed Care – PPO

## 2022-05-05 ENCOUNTER — Other Ambulatory Visit: Payer: Self-pay

## 2022-05-05 ENCOUNTER — Inpatient Hospital Stay: Payer: BC Managed Care – PPO

## 2022-05-05 DIAGNOSIS — N202 Calculus of kidney with calculus of ureter: Secondary | ICD-10-CM | POA: Diagnosis not present

## 2022-05-05 DIAGNOSIS — Z79890 Hormone replacement therapy (postmenopausal): Secondary | ICD-10-CM | POA: Diagnosis not present

## 2022-05-05 DIAGNOSIS — N1330 Unspecified hydronephrosis: Secondary | ICD-10-CM | POA: Diagnosis not present

## 2022-05-05 DIAGNOSIS — E039 Hypothyroidism, unspecified: Secondary | ICD-10-CM | POA: Insufficient documentation

## 2022-05-05 DIAGNOSIS — Z79899 Other long term (current) drug therapy: Secondary | ICD-10-CM | POA: Insufficient documentation

## 2022-05-05 DIAGNOSIS — N201 Calculus of ureter: Secondary | ICD-10-CM

## 2022-05-05 DIAGNOSIS — N200 Calculus of kidney: Secondary | ICD-10-CM | POA: Diagnosis not present

## 2022-05-05 DIAGNOSIS — Z87442 Personal history of urinary calculi: Secondary | ICD-10-CM | POA: Diagnosis not present

## 2022-05-05 DIAGNOSIS — N132 Hydronephrosis with renal and ureteral calculous obstruction: Secondary | ICD-10-CM | POA: Insufficient documentation

## 2022-05-05 HISTORY — PX: CYSTOSCOPY/URETEROSCOPY/HOLMIUM LASER/STENT PLACEMENT: SHX6546

## 2022-05-05 SURGERY — CYSTOSCOPY/URETEROSCOPY/HOLMIUM LASER/STENT PLACEMENT
Anesthesia: General | Laterality: Right

## 2022-05-05 MED ORDER — SUCCINYLCHOLINE CHLORIDE 200 MG/10ML IV SOSY
PREFILLED_SYRINGE | INTRAVENOUS | Status: AC
  Filled 2022-05-05: qty 10

## 2022-05-05 MED ORDER — KETOROLAC TROMETHAMINE 30 MG/ML IJ SOLN
INTRAMUSCULAR | Status: AC
  Filled 2022-05-05: qty 1

## 2022-05-05 MED ORDER — FENTANYL CITRATE (PF) 100 MCG/2ML IJ SOLN
INTRAMUSCULAR | Status: AC
  Filled 2022-05-05: qty 2

## 2022-05-05 MED ORDER — MIDAZOLAM HCL 2 MG/2ML IJ SOLN
INTRAMUSCULAR | Status: DC | PRN
  Administered 2022-05-05 (×2): 1 mg via INTRAVENOUS

## 2022-05-05 MED ORDER — EPHEDRINE 5 MG/ML INJ
INTRAVENOUS | Status: AC
  Filled 2022-05-05: qty 5

## 2022-05-05 MED ORDER — FAMOTIDINE 20 MG PO TABS
ORAL_TABLET | ORAL | Status: AC
  Administered 2022-05-05: 20 mg via ORAL
  Filled 2022-05-05: qty 1

## 2022-05-05 MED ORDER — EPHEDRINE SULFATE (PRESSORS) 50 MG/ML IJ SOLN
INTRAMUSCULAR | Status: DC | PRN
  Administered 2022-05-05 (×2): 5 mg via INTRAVENOUS

## 2022-05-05 MED ORDER — PHENYLEPHRINE 80 MCG/ML (10ML) SYRINGE FOR IV PUSH (FOR BLOOD PRESSURE SUPPORT)
PREFILLED_SYRINGE | INTRAVENOUS | Status: AC
  Filled 2022-05-05: qty 10

## 2022-05-05 MED ORDER — ONDANSETRON HCL 4 MG/2ML IJ SOLN
INTRAMUSCULAR | Status: DC | PRN
  Administered 2022-05-05: 4 mg via INTRAVENOUS

## 2022-05-05 MED ORDER — LIDOCAINE HCL (CARDIAC) PF 100 MG/5ML IV SOSY
PREFILLED_SYRINGE | INTRAVENOUS | Status: DC | PRN
  Administered 2022-05-05: 50 mg via INTRAVENOUS

## 2022-05-05 MED ORDER — OXYCODONE HCL 5 MG PO TABS
5.0000 mg | ORAL_TABLET | Freq: Once | ORAL | Status: AC | PRN
  Administered 2022-05-05: 5 mg via ORAL

## 2022-05-05 MED ORDER — DROPERIDOL 2.5 MG/ML IJ SOLN: 0.6250 mg | Freq: Once | INTRAMUSCULAR | Status: DC | PRN

## 2022-05-05 MED ORDER — SODIUM CHLORIDE 0.9 % IR SOLN
Status: DC | PRN
  Administered 2022-05-05: 3000 mL

## 2022-05-05 MED ORDER — ROCURONIUM BROMIDE 10 MG/ML (PF) SYRINGE
PREFILLED_SYRINGE | INTRAVENOUS | Status: AC
  Filled 2022-05-05: qty 10

## 2022-05-05 MED ORDER — OXYCODONE HCL 5 MG PO TABS
ORAL_TABLET | ORAL | Status: AC
  Filled 2022-05-05: qty 1

## 2022-05-05 MED ORDER — ACETAMINOPHEN 10 MG/ML IV SOLN: 1000.0000 mg | Freq: Once | INTRAVENOUS | Status: DC | PRN

## 2022-05-05 MED ORDER — PROMETHAZINE HCL 25 MG/ML IJ SOLN: 6.2500 mg | INTRAMUSCULAR | Status: DC | PRN

## 2022-05-05 MED ORDER — FENTANYL CITRATE (PF) 100 MCG/2ML IJ SOLN
25.0000 ug | INTRAMUSCULAR | Status: DC | PRN
  Administered 2022-05-05 (×2): 25 ug via INTRAVENOUS

## 2022-05-05 MED ORDER — IOHEXOL 180 MG/ML  SOLN
INTRAMUSCULAR | Status: DC | PRN
  Administered 2022-05-05: 40 mL

## 2022-05-05 MED ORDER — DEXAMETHASONE SODIUM PHOSPHATE 10 MG/ML IJ SOLN
INTRAMUSCULAR | Status: AC
  Filled 2022-05-05: qty 1

## 2022-05-05 MED ORDER — PHENYLEPHRINE HCL (PRESSORS) 10 MG/ML IV SOLN
INTRAVENOUS | Status: DC | PRN
  Administered 2022-05-05: 80 ug via INTRAVENOUS
  Administered 2022-05-05 (×2): 120 ug via INTRAVENOUS
  Administered 2022-05-05 (×3): 80 ug via INTRAVENOUS

## 2022-05-05 MED ORDER — GABAPENTIN 300 MG PO CAPS
ORAL_CAPSULE | ORAL | Status: AC
  Administered 2022-05-05: 300 mg
  Filled 2022-05-05: qty 1

## 2022-05-05 MED ORDER — PROPOFOL 10 MG/ML IV BOLUS
INTRAVENOUS | Status: DC | PRN
  Administered 2022-05-05: 150 mg via INTRAVENOUS

## 2022-05-05 MED ORDER — OXYCODONE-ACETAMINOPHEN 5-325 MG PO TABS: 1.0000 | ORAL_TABLET | Freq: Four times a day (QID) | ORAL | 0 refills | Status: DC | PRN

## 2022-05-05 MED ORDER — FENTANYL CITRATE (PF) 100 MCG/2ML IJ SOLN
INTRAMUSCULAR | Status: AC
  Administered 2022-05-05: 25 ug via INTRAVENOUS
  Filled 2022-05-05: qty 2

## 2022-05-05 MED ORDER — OXYCODONE HCL 5 MG/5ML PO SOLN: 5.0000 mg | Freq: Once | ORAL | Status: AC | PRN

## 2022-05-05 MED ORDER — PROPOFOL 10 MG/ML IV BOLUS
INTRAVENOUS | Status: AC
  Filled 2022-05-05: qty 20

## 2022-05-05 MED ORDER — KETOROLAC TROMETHAMINE 30 MG/ML IJ SOLN
INTRAMUSCULAR | Status: DC | PRN
  Administered 2022-05-05: 30 mg via INTRAVENOUS

## 2022-05-05 MED ORDER — DEXAMETHASONE SODIUM PHOSPHATE 10 MG/ML IJ SOLN
INTRAMUSCULAR | Status: DC | PRN
  Administered 2022-05-05: 10 mg via INTRAVENOUS

## 2022-05-05 MED ORDER — MIDAZOLAM HCL 2 MG/2ML IJ SOLN
INTRAMUSCULAR | Status: AC
  Filled 2022-05-05: qty 2

## 2022-05-05 MED ORDER — ACETAMINOPHEN 500 MG PO TABS
ORAL_TABLET | ORAL | Status: AC
  Administered 2022-05-05: 1000 mg via ORAL
  Filled 2022-05-05: qty 2

## 2022-05-05 MED ORDER — FENTANYL CITRATE (PF) 100 MCG/2ML IJ SOLN
INTRAMUSCULAR | Status: DC | PRN
  Administered 2022-05-05 (×2): 25 ug via INTRAVENOUS
  Administered 2022-05-05: 50 ug via INTRAVENOUS

## 2022-05-05 MED ORDER — CHLORHEXIDINE GLUCONATE 0.12 % MT SOLN
OROMUCOSAL | Status: AC
  Administered 2022-05-05: 15 mL via OROMUCOSAL
  Filled 2022-05-05: qty 15

## 2022-05-05 SURGICAL SUPPLY — 24 items
BAG DRAIN CYSTO-URO LG1000N (MISCELLANEOUS) ×2 IMPLANT
BASKET LASER NITINOL 1.9FR (BASKET) ×1 IMPLANT
BASKET ZERO TIP 1.9FR (BASKET) ×1 IMPLANT
BRUSH SCRUB EZ 1% IODOPHOR (MISCELLANEOUS) ×2 IMPLANT
CNTNR SPEC 2.5X3XGRAD LEK (MISCELLANEOUS)
CONT SPEC 4OZ STER OR WHT (MISCELLANEOUS)
CONTAINER SPEC 2.5X3XGRAD LEK (MISCELLANEOUS) IMPLANT
DRAPE UTILITY 15X26 TOWEL STRL (DRAPES) ×2 IMPLANT
FIBER LASER MOSES 200 DFL (Laser) ×1 IMPLANT
GLOVE SURG UNDER POLY LF SZ7.5 (GLOVE) ×2 IMPLANT
GOWN STRL REUS W/ TWL LRG LVL3 (GOWN DISPOSABLE) ×1 IMPLANT
GOWN STRL REUS W/ TWL XL LVL3 (GOWN DISPOSABLE) ×1 IMPLANT
GOWN STRL REUS W/TWL LRG LVL3 (GOWN DISPOSABLE) ×1
GOWN STRL REUS W/TWL XL LVL3 (GOWN DISPOSABLE) ×1
GUIDEWIRE STR DUAL SENSOR (WIRE) ×2 IMPLANT
IV NS IRRIG 3000ML ARTHROMATIC (IV SOLUTION) ×2 IMPLANT
KIT TURNOVER CYSTO (KITS) ×2 IMPLANT
PACK CYSTO AR (MISCELLANEOUS) ×2 IMPLANT
SET CYSTO W/LG BORE CLAMP LF (SET/KITS/TRAYS/PACK) ×2 IMPLANT
STENT URET 6FRX24 CONTOUR (STENTS) ×1 IMPLANT
SURGILUBE 2OZ TUBE FLIPTOP (MISCELLANEOUS) ×2 IMPLANT
SYR 30ML LL (SYRINGE) ×1 IMPLANT
VALVE UROSEAL ADJ ENDO (VALVE) ×1 IMPLANT
WATER STERILE IRR 500ML POUR (IV SOLUTION) ×2 IMPLANT

## 2022-05-05 NOTE — Interval H&P Note (Signed)
History and Physical Interval Note: Patient presents for follow-up/staged ureteroscopy.  All questions were answered and she desires to proceed.  CV: RRR\lungs: Clear  05/05/2022 1:41 PM  Kari Acosta  has presented today for surgery, with the diagnosis of Right Ureteral Stone, Right Nephrolithiasis.  The various methods of treatment have been discussed with the patient and family. After consideration of risks, benefits and other options for treatment, the patient has consented to  Procedure(s): CYSTOSCOPY/URETEROSCOPY/HOLMIUM LASER/STENT EXCHANGE (Right) as a surgical intervention.  The patient's history has been reviewed, patient examined, no change in status, stable for surgery.  I have reviewed the patient's chart and labs.  Questions were answered to the patient's satisfaction.     [place]

## 2022-05-05 NOTE — Transfer of Care (Signed)
Immediate Anesthesia Transfer of Care Note  Patient: Kari Acosta  Procedure(s) Performed: CYSTOSCOPY/URETEROSCOPY/HOLMIUM LASER/STENT EXCHANGE (Right)  Patient Location: PACU  Anesthesia Type:General  Level of Consciousness: drowsy  Airway & Oxygen Therapy: Patient Spontanous Breathing and Patient connected to nasal cannula oxygen  Post-op Assessment:   Post vital signs: Reviewed and stable  Last Vitals:  Vitals Value Taken Time  BP 127/88 05/05/22 1531  Temp    Pulse 80 05/05/22 1534  Resp 13 05/05/22 1534  SpO2 100 % 05/05/22 1534  Vitals shown include unvalidated device data.  Last Pain:  Vitals:   05/05/22 0922  TempSrc: Temporal         Complications: No notable events documented.

## 2022-05-05 NOTE — Anesthesia Postprocedure Evaluation (Signed)
Anesthesia Post Note  Patient: Kari Acosta  Procedure(s) Performed: CYSTOSCOPY/URETEROSCOPY/HOLMIUM LASER/STENT EXCHANGE (Right)  Patient location during evaluation: PACU Anesthesia Type: General Level of consciousness: awake and alert Pain management: pain level controlled Vital Signs Assessment: post-procedure vital signs reviewed and stable Respiratory status: spontaneous breathing, nonlabored ventilation, respiratory function stable and patient connected to nasal cannula oxygen Cardiovascular status: blood pressure returned to baseline and stable Postop Assessment: no apparent nausea or vomiting Anesthetic complications: no   No notable events documented.   Last Vitals:  Vitals:   05/05/22 1532 05/05/22 1545  BP: 127/88 122/88  Pulse: 81 83  Resp: 12 13  Temp: (!) 36.3 C   SpO2: 100% 100%    Last Pain:  Vitals:   05/05/22 1601  TempSrc:   PainSc: 7                  Arita Miss

## 2022-05-05 NOTE — Discharge Instructions (Addendum)
DISCHARGE INSTRUCTIONS FOR KIDNEY STONE/URETERAL STENT   MEDICATIONS:  1. Resume all your other meds from home.  2.  AZO (over-the-counter) can help with the burning/stinging when you urinate.   ACTIVITY:  1. May resume regular activities in 24 hours. 2. No driving while on narcotic pain medications  3. Drink plenty of water  4. Continue to walk at home - you can still get blood clots when you are at home, so keep active, but don't over do it.  5. May return to work/school tomorrow or when you feel ready   BATHING:  1. You can shower. 2. You have a string coming from your urethra: The stent string is attached to your ureteral stent. Do not pull on this.   SIGNS/SYMPTOMS TO CALL:  Common postoperative symptoms include urinary frequency, urgency, bladder spasm and blood in the urine  Please call us if you have a fever greater than 101.5, uncontrolled nausea/vomiting, uncontrolled pain, dizziness, unable to urinate, excessively bloody urine, chest pain, shortness of breath, leg swelling, leg pain, or any other concerns or questions.   You can reach [place] at [phone].   FOLLOW-UP:  1. You will be contacted for a follow-up appointment 2. You have a string attached to your stent, you may remove it on Saturday, 05/09/2022. To do this, pull the string until the stent is completely removed. You may feel an odd sensation in your back.  AMBULATORY SURGERY  DISCHARGE INSTRUCTIONS   The drugs that you were given will stay in your system until tomorrow so for the next 24 hours you should not:  Drive an automobile Make any legal decisions Drink any alcoholic beverage   You may resume regular meals tomorrow.  Today it is better to start with liquids and gradually work up to solid foods.  You may eat anything you prefer, but it is better to start with liquids, then soup and crackers, and gradually work up to solid foods.   Please notify your doctor immediately if you have any unusual  bleeding, trouble breathing, redness and pain at the surgery site, drainage, fever, or pain not relieved by medication.        Please contact your physician with any problems or Same Day Surgery at [phone], Monday through Friday 6 am to 4 pm, or [place] at [facility] number at [phone].

## 2022-05-06 ENCOUNTER — Telehealth: Payer: Self-pay

## 2022-05-06 ENCOUNTER — Encounter: Payer: Self-pay | Admitting: Urology

## 2022-05-06 NOTE — Telephone Encounter (Signed)
Called patient to follow up with an after hours nurse call. Call states patient has not yet received pain medication post op. Script was sent after call. Patient states she was able to pick up the medication last night and is feeling better this morning.

## 2022-05-06 NOTE — Op Note (Signed)
Preoperative diagnosis:  Right nephrolithiasis  Postoperative diagnosis:  Right nephrolithiasis Right distal ureteral fragments  Procedure:  Cystoscopy Right ureteroscopy and stone removal Ureteroscopic laser lithotripsy Right ureteral stent exchange (67F/24 cm)  Right retrograde pyelography with interpretation Intraoperative fluoroscopy < 60 minutes  Surgeon: Nicki Reaper C. Marianne Golightly, M.D.  Anesthesia: General  Complications: None  Intraoperative findings:  Remaining fragments measuring ~ 2 mm right distal ureter which were removed with a 1.9 [place] nitinol basket Marked right hydronephrosis with dilated renal pelvis and calyces 10 mm, 5 mm and 5 mm calculi were identified in upper and mid calyces and dusted with the holmium laser.  The large inferior pole calcification did not communicate with the collecting system a lower calyceal calcification not seen on previous CT which appear to be within the lower pole calyx could not be identified on pyeloscopy Retrograde pyelography demonstrated calcifications as described above.  EBL: Minimal  Specimens: Calculus fragments for analysis   Indication: LUDY MESSAMORE is a 46 y.o. female found to have 2 large right distal ureteral calculus and severe hydronephrosis/hydroureter secondary to chronic obstruction.  Ureteroscopy performed 2 weeks ago remarkable for impacted distal ureteral calculi which took 2 hours for treatment and staged ureteroscopy was recommended for treatment of her renal calculi.  After reviewing the management options for treatment, the patient elected to proceed with the above surgical procedure(s). We have discussed the potential benefits and risks of the procedure, side effects of the proposed treatment, the likelihood of the patient achieving the goals of the procedure, and any potential problems that might occur during the procedure or recuperation. Informed consent has been obtained.  Description of procedure:  The  patient was taken to the operating room and general anesthesia was induced.  The patient was placed in the dorsal lithotomy position, prepped and draped in the usual sterile fashion, and preoperative antibiotics were administered. A preoperative time-out was performed.   A 21 French cystoscope was lubricated and passed per urethra.  No bladder or gross lesions were noted.  There were mild inflammatory changes right hemitrigone secondary to the indwelling stent.  The stent was grasped with endoscopic forceps and brought out through the urethral meatus.  A 0.038 Sensor wire was then placed through the stent and advanced into the renal pelvis under fluoroscopic guidance without difficulty with subsequent removal of the stent.  A single channel digital flexible ureteroscope was then passed per urethra and into the right UO alongside the guidewire without difficulty.  At the site of her previous stone impaction 3 small calculi were identified and removed with a 1.9 [place] nitinol basket.  The ureteroscope was then advanced proximally.  The ureter was dilated and tortuous.  Pyeloscopy was then performed with findings as described above.  The identified calculi were dusted with a 200 m Moses laser fiber at a setting of 0.3J/80 Hz.  The 10 mm calculus was placed in an Escape basket and released in the dilated lower proximal ureter and laser lithotripsy was performed in that location.  Repeat pyeloscopy was performed and no significantly sized stone fragments were identified.  The ureteroscope was removed under direct vision and no ureteral fragments were noted.  A 67F/24 cm Contour ureteral stent with tether was then placed under fluoroscopic guidance.  A good curl was noted in the renal pelvis and bladder.  The bladder was then emptied and the procedure ended.  The patient appeared to tolerate the procedure well and without complications.  After anesthetic reversal the patient was  transported to the PACU in  stable condition.   Plan: She may remove her stent on Saturday, 05/09/2022 Follow-up appointment to be scheduled next month Recommend renal scan on follow-up to assess right renal function Will need to monitor for possibility of ureteral stricture   John Giovanni, MD

## 2022-05-07 ENCOUNTER — Encounter: Payer: BC Managed Care – PPO | Admitting: Urology

## 2022-05-12 ENCOUNTER — Encounter: Payer: Self-pay | Admitting: Urology

## 2022-05-13 LAB — CALCULI, WITH PHOTOGRAPH (CLINICAL LAB)
Calcium Oxalate Dihydrate: 20 %
Calcium Oxalate Monohydrate: 70 %
Hydroxyapatite: 10 %
Weight Calculi: 3 mg

## 2022-05-14 ENCOUNTER — Ambulatory Visit (INDEPENDENT_AMBULATORY_CARE_PROVIDER_SITE_OTHER): Payer: BC Managed Care – PPO | Admitting: Urology

## 2022-05-14 ENCOUNTER — Encounter: Payer: Self-pay | Admitting: Urology

## 2022-05-14 VITALS — BP 141/85 | HR 87 | Ht 68.0 in | Wt 170.0 lb

## 2022-05-14 DIAGNOSIS — N1330 Unspecified hydronephrosis: Secondary | ICD-10-CM | POA: Diagnosis not present

## 2022-05-14 DIAGNOSIS — N200 Calculus of kidney: Secondary | ICD-10-CM | POA: Diagnosis not present

## 2022-05-14 DIAGNOSIS — N261 Atrophy of kidney (terminal): Secondary | ICD-10-CM

## 2022-05-14 NOTE — Progress Notes (Signed)
05/14/2022 2:27 PM   Kari Acosta Jul 09, 1976 [id number]  Referring provider: Jon Billings, NP [address] [place],  [place] [address]  Chief Complaint  Patient presents with   Nephrolithiasis    HPI: 46 y.o. female presents for postop follow-up.  Status post staged ureteroscopy for impacted 8 and 12.5 mm right distal ureteral calculi and right renal calculi on 04/21/2022 and 05/05/2022 Her ureteral calculi were treated at the initial procedure and she states after this procedure all of her pelvic symptoms she was experiencing preoperatively resolved She removed her stent via tether 05/09/2022.  Did have some right flank pain post stent removal which has significantly improved She was noted to have right renal atrophy   PMH: Past Medical History:  Diagnosis Date   Anemia    Complication of anesthesia    difficult to wake up after hysterectomy   Family history of colon cancer in mother    Family history of ovarian cancer 08/2021   cancer genetic testing letter sent   History of kidney stones    Hypothyroidism    Right nephrolithiasis 04/2022    Surgical History: Past Surgical History:  Procedure Laterality Date   CESAREAN SECTION     CHOLECYSTECTOMY     COLONOSCOPY WITH PROPOFOL N/A 02/26/2022   Procedure: COLONOSCOPY WITH PROPOFOL;  Surgeon: Lucilla Lame, MD;  Location: [facility] END[facility];  Service: Endoscopy;  Laterality: N/A;   CYSTOSCOPY  09/23/2021   Procedure: CYSTOSCOPY;  Surgeon: Gae Dry, MD;  Location: [facility];  Service: Gynecology;;   CYSTOSCOPY/URETEROSCOPY/HOLMIUM LASER/STENT PLACEMENT Right 05/05/2022   Procedure: CYSTOSCOPY/URETEROSCOPY/HOLMIUM LASER/STENT EXCHANGE;  Surgeon: Abbie Sons, MD;  Location: [facility];  Service: Urology;  Laterality: Right;   CYSTOSCOPY/URETEROSCOPY/HOLMIUM LASER/STENT PLACEMENT Right 04/21/2022   Procedure: CYSTOSCOPY/URETEROSCOPY/HOLMIUM LASER/STENT PLACEMENT;  Surgeon: Abbie Sons, MD;  Location: [facility];   Service: Urology;  Laterality: Right;   LAPAROSCOPIC BILATERAL SALPINGECTOMY Bilateral 09/23/2021   Procedure: LAPAROSCOPIC BILATERAL SALPINGECTOMY;  Surgeon: Gae Dry, MD;  Location: [facility];  Service: Gynecology;  Laterality: Bilateral;   LAPAROSCOPIC SUPRACERVICAL HYSTERECTOMY  09/23/2021   Procedure: LAPAROSCOPIC SUPRACERVICAL HYSTERECTOMY WITH ABLATION OF ENDOMETRIAL CELLS;  Surgeon: Gae Dry, MD;  Location: [facility];  Service: Gynecology;;    Home Medications:  Allergies as of 05/14/2022   No Known Allergies      Medication List        Accurate as of May 14, 2022  2:27 PM. If you have any questions, ask your nurse or doctor.          levothyroxine 50 MCG tablet Commonly known as: SYNTHROID TAKE 1 TABLET BY MOUTH EVERY DAY   oxybutynin 5 MG tablet Commonly known as: DITROPAN 1 tab tid prn frequency,urgency, bladder spasm   oxyCODONE-acetaminophen 5-325 MG tablet Commonly known as: PERCOCET/ROXICET Take 1 tablet by mouth every 6 (six) hours as needed for severe pain.   tamsulosin 0.4 MG Caps capsule Commonly known as: FLOMAX Take 1 capsule (0.4 mg total) by mouth daily after breakfast.        Allergies: No Known Allergies  Family History: Family History  Problem Relation Age of Onset   Colon cancer Mother 49   Hypertension Mother    Hyperlipidemia Father    Hypertension Father    Stroke Father    Healthy Son    Healthy Son    Ovarian cancer Maternal Aunt    Breast cancer Maternal Grandmother    Cancer Maternal Grandmother  Gallbladder Cancer   Lung cancer Maternal Grandmother    Hypertension Maternal Grandfather    Heart failure Paternal Grandmother    Tuberculosis Paternal Grandfather    Cancer - Other Maternal Great-grandmother        Gallbladder Cancer    Social History:  reports that she has never smoked. She has never used smokeless tobacco. She reports that she does not drink alcohol and does not use  drugs.   Physical Exam: BP (!) 141/85   Pulse 87   Ht '5\' 8"'$  (1.727 m)   Wt 170 lb (77.1 kg)   LMP 09/22/2021 (Approximate)   BMI 25.85 kg/m   Constitutional:  Alert and oriented, No acute distress. HEENT: [place] AT, moist mucus membranes.  Trachea midline, no masses. Cardiovascular: No clubbing, cyanosis, or edema. Psychiatric: Normal mood and affect.   Assessment & Plan:   Status post staged ureteroscopic stone removal.  The larger right lower pole calcification was not within the collecting system on retrograde. Her ureteral calculi were large and impacted and she is at risk of ureteral stricture formation.  Due to chronic obstruction unlikely her hydronephrosis will resolve Recommend a Lasix renogram in approximately 8 weeks to assess for any evidence of obstruction and differential renal function Instructed call earlier for persistent or worsening right flank pain    Abbie Sons, MD  [place] [address] [place], [place] [address] [phone]

## 2022-05-15 ENCOUNTER — Encounter: Payer: Self-pay | Admitting: Nurse Practitioner

## 2022-05-15 ENCOUNTER — Ambulatory Visit (INDEPENDENT_AMBULATORY_CARE_PROVIDER_SITE_OTHER): Payer: BC Managed Care – PPO | Admitting: Nurse Practitioner

## 2022-05-15 VITALS — BP 121/77 | HR 84 | Temp 98.3°F | Wt 172.0 lb

## 2022-05-15 DIAGNOSIS — N200 Calculus of kidney: Secondary | ICD-10-CM

## 2022-05-15 DIAGNOSIS — E039 Hypothyroidism, unspecified: Secondary | ICD-10-CM

## 2022-05-15 NOTE — Progress Notes (Signed)
BP 121/77   Pulse 84   Temp 98.3 F (36.8 C) (Oral)   Wt 172 lb (78 kg)   LMP 09/22/2021 (Approximate)   SpO2 100%   BMI 26.15 kg/m    Subjective:    Patient ID: Kari Acosta, female    DOB: May 19, 1976, 46 y.o.   MRN: [id number]  HPI: Kari Acosta is a 46 y.o. female  Chief Complaint  Patient presents with   Fatigue   Abdominal Pain   Follow-up    Asked pt how she was feeling since we last saw her pt  states " I had surgery." No further comment from patient.    ANEMIA Anemia status: uncontrolled Etiology of anemia: IDA Duration of anemia treatment: unsure Compliance with treatment:  did not start the iron supplement due to already having constipation. Iron supplementation side effects:  NA Severity of anemia: moderate Fatigue: yes Decreased exercise tolerance: no  Dyspnea on exertion: no Palpitations: no Bleeding: no Pica: no  Patient states she has been having ongoing pressure in her abdomen as she had before her hysterectomy.  Patient states she had a right uretal stone that was imbedded.  The abdominal pressure has improved.  She is feeling a lot better after the surgery.   Relevant past medical, surgical, family and social history reviewed and updated as indicated. Interim medical history since our last visit reviewed. Allergies and medications reviewed and updated.  Review of Systems  Constitutional:  Positive for fatigue. Negative for unexpected weight change.  Cardiovascular:  Negative for palpitations and leg swelling.  Gastrointestinal:  Negative for abdominal pain, constipation and diarrhea.  Endocrine: Negative for cold intolerance and heat intolerance.  Psychiatric/Behavioral:  Negative for dysphoric mood. The patient is not nervous/anxious.    Per HPI unless specifically indicated above     Objective:    BP 121/77   Pulse 84   Temp 98.3 F (36.8 C) (Oral)   Wt 172 lb (78 kg)   LMP 09/22/2021 (Approximate)   SpO2 100%   BMI 26.15  kg/m   Wt Readings from Last 3 Encounters:  05/15/22 172 lb (78 kg)  05/14/22 170 lb (77.1 kg)  05/05/22 173 lb 1 oz (78.5 kg)    Physical Exam Vitals and nursing note reviewed.  Constitutional:      General: She is not in acute distress.    Appearance: Normal appearance. She is normal weight. She is not ill-appearing, toxic-appearing or diaphoretic.  HENT:     Head: Normocephalic.     Right Ear: External ear normal.     Left Ear: External ear normal.     Nose: Nose normal.     Mouth/Throat:     Mouth: Mucous membranes are moist.     Pharynx: Oropharynx is clear.  Eyes:     General:        Right eye: No discharge.        Left eye: No discharge.     Extraocular Movements: Extraocular movements intact.     Conjunctiva/sclera: Conjunctivae normal.     Pupils: Pupils are equal, round, and reactive to light.  Cardiovascular:     Rate and Rhythm: Normal rate and regular rhythm.     Heart sounds: No murmur heard. Pulmonary:     Effort: Pulmonary effort is normal. No respiratory distress.     Breath sounds: Normal breath sounds. No wheezing or rales.  Musculoskeletal:     Cervical back: Normal range of motion and neck supple.  Skin:    General: Skin is warm and dry.     Capillary Refill: Capillary refill takes less than 2 seconds.  Neurological:     General: No focal deficit present.     Mental Status: She is alert and oriented to person, place, and time. Mental status is at baseline.  Psychiatric:        Mood and Affect: Mood normal.        Behavior: Behavior normal.        Thought Content: Thought content normal.        Judgment: Judgment normal.    Results for orders placed or performed during the hospital encounter of 05/05/22  Calculi, with Photograph (to Clinical Lab)  Result Value Ref Range   Source Calculi Comment    Color Calculi Brown    Size Calculi 2x2 mm   Weight Calculi 3 mg   Composition Calculi Comment    Calcium Oxalate Monohydrate 70 %   Calcium  Oxalate Dihydrate 20 %   Hydroxyapatite 10 %   Comment Calculi 1 Comment    Comment Calculi 2 Comment    Photo Calculi Comment    Comment Calculi 3 Comment    Please Note: Comment    DISCLAIMER: Comment       Assessment & Plan:   Problem List Items Addressed This Visit       Endocrine   Hypothyroidism - Primary    Chronic.  Controlled.  Continue with current medication regimen of Levothyroxine 57mg.  Labs ordered today.  Return to clinic in 3 months for reevaluation.  Call sooner if concerns arise.         Relevant Orders   TSH   T4, free   Other Visit Diagnoses     Nephrolithiasis       Will check kidney function today. Stones have been removed by Dr. SElenor Quinones  Patient's abdominal pain and pressure resolved.   Relevant Orders   Comp Met (CMET)        Follow up plan: Return in about 3 months (around 08/15/2022) for Physical and Fasting labs.

## 2022-05-15 NOTE — Assessment & Plan Note (Signed)
Chronic.  Controlled.  Continue with current medication regimen of Levothyroxine 65mg.  Labs ordered today.  Return to clinic in 3 months for reevaluation.  Call sooner if concerns arise.

## 2022-05-16 LAB — TSH: TSH: 4.64 u[IU]/mL — ABNORMAL HIGH (ref 0.450–4.500)

## 2022-05-16 LAB — COMPREHENSIVE METABOLIC PANEL
ALT: 9 IU/L (ref 0–32)
AST: 15 IU/L (ref 0–40)
Albumin/Globulin Ratio: 1.7 (ref 1.2–2.2)
Albumin: 4.3 g/dL (ref 3.8–4.8)
Alkaline Phosphatase: 52 IU/L (ref 44–121)
BUN/Creatinine Ratio: 12 (ref 9–23)
BUN: 13 mg/dL (ref 6–24)
Bilirubin Total: 0.5 mg/dL (ref 0.0–1.2)
CO2: 24 mmol/L (ref 20–29)
Calcium: 9.3 mg/dL (ref 8.7–10.2)
Chloride: 99 mmol/L (ref 96–106)
Creatinine, Ser: 1.13 mg/dL — ABNORMAL HIGH (ref 0.57–1.00)
Globulin, Total: 2.5 g/dL (ref 1.5–4.5)
Glucose: 93 mg/dL (ref 70–99)
Potassium: 4.2 mmol/L (ref 3.5–5.2)
Sodium: 138 mmol/L (ref 134–144)
Total Protein: 6.8 g/dL (ref 6.0–8.5)
eGFR: 62 mL/min/{1.73_m2} (ref >59)

## 2022-05-16 LAB — T4, FREE: Free T4: 1.29 ng/dL (ref 0.82–1.77)

## 2022-05-17 ENCOUNTER — Encounter: Payer: Self-pay | Admitting: Urology

## 2022-05-17 MED ORDER — LEVOTHYROXINE SODIUM 50 MCG PO TABS: 50.0000 ug | ORAL_TABLET | Freq: Every day | ORAL | 1 refills | Status: DC

## 2022-05-17 NOTE — Progress Notes (Signed)
Please let patient know that her lab work shows that her kidney function did decline some.  I recommend she drink at least 64 ounces of water daily and avoid NSAIDS such as ibuprofen, motrin, or aleve.  Her TSH was slightly abnormal but her T4 was normal .  We will continue with the current dose of levothyroxine.  Follow up as discussed.

## 2022-05-17 NOTE — Addendum Note (Signed)
Addended by: Jon Billings on: 05/17/2022 10:13 AM   Modules accepted: Orders

## 2022-06-05 ENCOUNTER — Inpatient Hospital Stay: Payer: BC Managed Care – PPO

## 2022-06-05 ENCOUNTER — Inpatient Hospital Stay: Payer: BC Managed Care – PPO | Admitting: Internal Medicine

## 2022-06-09 ENCOUNTER — Encounter: Payer: Self-pay | Admitting: Internal Medicine

## 2022-06-09 ENCOUNTER — Inpatient Hospital Stay: Payer: BC Managed Care – PPO | Admitting: Internal Medicine

## 2022-06-09 ENCOUNTER — Inpatient Hospital Stay: Payer: BC Managed Care – PPO

## 2022-06-09 ENCOUNTER — Inpatient Hospital Stay: Payer: BC Managed Care – PPO | Attending: Internal Medicine

## 2022-06-09 VITALS — BP 122/88 | HR 68 | Resp 16

## 2022-06-09 DIAGNOSIS — E611 Iron deficiency: Secondary | ICD-10-CM

## 2022-06-09 DIAGNOSIS — D509 Iron deficiency anemia, unspecified: Secondary | ICD-10-CM | POA: Diagnosis not present

## 2022-06-09 LAB — CBC WITH DIFFERENTIAL/PLATELET
Abs Immature Granulocytes: 0.01 10*3/uL (ref 0.00–0.07)
Basophils Absolute: 0.1 10*3/uL (ref 0.0–0.1)
Basophils Relative: 1 %
Eosinophils Absolute: 0.1 10*3/uL (ref 0.0–0.5)
Eosinophils Relative: 2 %
HCT: 36.9 % (ref 36.0–46.0)
Hemoglobin: 12.1 g/dL (ref 12.0–15.0)
Immature Granulocytes: 0 %
Lymphocytes Relative: 32 %
Lymphs Abs: 1.4 10*3/uL (ref 0.7–4.0)
MCH: 28.5 pg (ref 26.0–34.0)
MCHC: 32.8 g/dL (ref 30.0–36.0)
MCV: 86.8 fL (ref 80.0–100.0)
Monocytes Absolute: 0.4 10*3/uL (ref 0.1–1.0)
Monocytes Relative: 10 %
Neutro Abs: 2.4 10*3/uL (ref 1.7–7.7)
Neutrophils Relative %: 55 %
Platelets: 134 10*3/uL — ABNORMAL LOW (ref 150–400)
RBC: 4.25 MIL/uL (ref 3.87–5.11)
RDW: 14.2 % (ref 11.5–15.5)
WBC: 4.4 10*3/uL (ref 4.0–10.5)
nRBC: 0 % (ref 0.0–0.2)

## 2022-06-09 LAB — RETICULOCYTES
Immature Retic Fract: 3.8 % (ref 2.3–15.9)
RBC.: 4.17 MIL/uL (ref 3.87–5.11)
Retic Count, Absolute: 36.3 10*3/uL (ref 19.0–186.0)
Retic Ct Pct: 0.9 % (ref 0.4–3.1)

## 2022-06-09 LAB — BASIC METABOLIC PANEL
Anion gap: 7 (ref 5–15)
BUN: 11 mg/dL (ref 6–20)
CO2: 29 mmol/L (ref 22–32)
Calcium: 8.7 mg/dL — ABNORMAL LOW (ref 8.9–10.3)
Chloride: 100 mmol/L (ref 98–111)
Creatinine, Ser: 0.83 mg/dL (ref 0.44–1.00)
GFR, Estimated: >60 mL/min (ref >60)
Glucose, Bld: 118 mg/dL — ABNORMAL HIGH (ref 70–99)
Potassium: 3.5 mmol/L (ref 3.5–5.1)
Sodium: 136 mmol/L (ref 135–145)

## 2022-06-09 LAB — LACTATE DEHYDROGENASE: LDH: 116 U/L (ref 98–192)

## 2022-06-09 MED ORDER — IRON SUCROSE 20 MG/ML IV SOLN
200.0000 mg | Freq: Once | INTRAVENOUS | Status: AC
  Administered 2022-06-09: 200 mg via INTRAVENOUS
  Filled 2022-06-09: qty 10

## 2022-06-09 MED ORDER — SODIUM CHLORIDE 0.9 % IV SOLN: 200.0000 mg | Freq: Once | INTRAVENOUS | Status: DC

## 2022-06-09 MED ORDER — SODIUM CHLORIDE 0.9 % IV SOLN
Freq: Once | INTRAVENOUS | Status: AC
  Filled 2022-06-09: qty 250

## 2022-06-09 NOTE — Progress Notes (Signed)
Patient denies new problems/concerns today.   °

## 2022-06-09 NOTE — Assessment & Plan Note (Addendum)
#  Iron deficient anemia [hx TAH]: 9.8; ferritin 6/iron saturation 6% [April 2023]-poor tolerance to oral iron.  S/p IV iron infusion   #June 2023 hemoglobin is 12.3.  As patient symptomatic proceed with Venofer.   #Etiology of iron deficiency: April 2023 colonoscopy negative for any acute chronic blood loss.  Recommend further work-up including EGD /possible capsule study. CT stone protocol-April 2023 negative for any acute process.  # Mild intermittent thrombocytopenia- 130-140s- STABLE.   # History of kidney stones/chronic mild hydronephrosis right side-s/p staged lithotripsy x 2  [ May & June 2023-Dr. Stoioff]-improved.  # DISPOSITION: # venofer today # venofer again in 1-2 weeks # follow up in 4 months- labs- cbc/bmp; iron studies; ferritin; possible venoferDr.B

## 2022-06-09 NOTE — Progress Notes (Signed)
[name] CONSULT NOTE  Patient Care Team: [name], NP as PCP - General (Nurse Practitioner) [name], MD as Consulting Physician (Oncology)  CHIEF COMPLAINTS/PURPOSE OF CONSULTATION: ANEMIA   HEMATOLOGY HISTORY  # ANEMIA[Hb-9; Iron sat; ferritin- 6%; Iron sat-6 [PCP] GFR-wnl; CR hematuria-colonoscopy-April 2023 [Dr.Wohl] NEG; EGD/capsulke- none; Oral iron: none sec  Constipation/dyspepsia  # TAH oct 2022; Right hydroKidney stones [Dr.Stoioff- lithotripys pening]  HISTORY OF PRESENTING ILLNESS: Ambulating independently.  Alone.  Kari Acosta 46 y.o.  female pleasant patient with iron deficiency anemia unclear etiology is here for follow-up.  Patient has a history of chronic iron deficiency anemia; of unclear etiology.  Patient s/p IV iron infusion noted to have improvement of energy levels.  Review of Systems  Constitutional:  Positive for malaise/fatigue. Negative for chills, diaphoresis, fever and weight loss.  HENT:  Negative for nosebleeds and sore throat.   Eyes:  Negative for double vision.  Respiratory:  Positive for shortness of breath. Negative for cough, hemoptysis, sputum production and wheezing.   Cardiovascular:  Negative for chest pain, palpitations, orthopnea and leg swelling.  Gastrointestinal:  Negative for abdominal pain, blood in stool, constipation, diarrhea, heartburn, melena, nausea and vomiting.  Genitourinary:  Negative for dysuria, frequency and urgency.  Musculoskeletal:  Positive for myalgias. Negative for joint pain.  Skin: Negative.  Negative for itching and rash.  Neurological:  Positive for dizziness, tingling and headaches. Negative for focal weakness and weakness.  Endo/Heme/Allergies:  Does not bruise/bleed easily.  Psychiatric/Behavioral:  Negative for depression. The patient is not nervous/anxious and does not have insomnia.      MEDICAL HISTORY:  Past Medical History:  Diagnosis Date  . Anemia    . Complication of anesthesia    difficult to wake up after hysterectomy  . Family history of colon cancer in mother   . Family history of ovarian cancer 08/2021   cancer genetic testing letter sent  . History of kidney stones   . Hypothyroidism   . Right nephrolithiasis 04/2022    SURGICAL HISTORY: Past Surgical History:  Procedure Laterality Date  . CESAREAN SECTION    . CHOLECYSTECTOMY    . COLONOSCOPY WITH PROPOFOL N/A 02/26/2022   Procedure: COLONOSCOPY WITH PROPOFOL;  Surgeon: Midge Minium, MD;  Location: [facility];  Service: Endoscopy;  Laterality: N/A;  . CYSTOSCOPY  09/23/2021   Procedure: CYSTOSCOPY;  Surgeon: Nadara Mustard, MD;  Location: [facility] ORS;  Service: Gynecology;;  . CYSTOSCOPY/URETEROSCOPY/HOLMIUM LASER/STENT PLACEMENT Right 05/05/2022   Procedure: CYSTOSCOPY/URETEROSCOPY/HOLMIUM LASER/STENT EXCHANGE;  Surgeon: Riki Altes, MD;  Location: [facility] OR[facility];  Service: Urology;  Laterality: Right;  . CYSTOSCOPY/URETEROSCOPY/HOLMIUM LASER/STENT PLACEMENT Right 04/21/2022   Procedure: CYSTOSCOPY/URETEROSCOPY/HOLMIUM LASER/STENT PLACEMENT;  Surgeon: Riki Altes, MD;  Location: [facility] ORS;  Service: Urology;  Laterality: Right;  . LAPAROSCOPIC BILATERAL SALPINGECTOMY Bilateral 09/23/2021   Procedure: LAPAROSCOPIC BILATERAL SALPINGECTOMY;  Surgeon: Nadara Mustard, MD;  Location: [facility] ORS;  Service: Gynecology;  Laterality: Bilateral;  . LAPAROSCOPIC SUPRACERVICAL HYSTERECTOMY  09/23/2021   Procedure: LAPAROSCOPIC SUPRACERVICAL HYSTERECTOMY WITH ABLATION OF ENDOMETRIAL CELLS;  Surgeon: Nadara Mustard, MD;  Location: [facility] ORS;  Service: Gynecology;;    SOCIAL HISTORY: Social History   Socioeconomic History  . Marital status: Significant Other    Spouse name: Not on file  . Number of children: 1  . Years of education: Not on file  . Highest education level: Not on file  Occupational History  . Not on file  Tobacco Use  . Smoking  status: Never  . Smokeless  tobacco: Never  Vaping Use  . Vaping Use: Never used  Substance and Sexual Activity  . Alcohol use: No  . Drug use: Never  . Sexual activity: Yes  Other Topics Concern  . 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     Social Determinants of Health   Financial Resource Strain: Not on file  Food Insecurity: Not on file  Transportation Needs: Not on file  Physical Activity: Not on file  Stress: Not on file  Social Connections: Not on file  Intimate Partner Violence: Not on file    FAMILY HISTORY: Family History  Problem Relation Age of Onset  . Colon cancer Mother 76  . Hypertension Mother   . Hyperlipidemia Father   . Hypertension Father   . Stroke Father   . Healthy Son   . Healthy Son   . Ovarian cancer Maternal Aunt   . Breast cancer Maternal Grandmother   . Cancer Maternal Grandmother        Gallbladder Cancer  . Lung cancer Maternal Grandmother   . Hypertension Maternal Grandfather   . Heart failure Paternal Grandmother   . Tuberculosis Paternal Grandfather   . Cancer - Other Maternal Great-grandmother        Gallbladder Cancer    ALLERGIES:  has No Known Allergies.  MEDICATIONS:  Current Outpatient Medications  Medication Sig Dispense Refill  . levothyroxine (SYNTHROID) 50 MCG tablet Take 1 tablet (50 mcg total) by mouth daily. 90 tablet 1  . oxybutynin (DITROPAN) 5 MG tablet 1 tab tid prn frequency,urgency, bladder spasm (Patient not taking: Reported on 05/15/2022) 15 tablet 0  . oxyCODONE-acetaminophen (PERCOCET/ROXICET) 5-325 MG tablet Take 1 tablet by mouth every 6 (six) hours as needed for severe pain. (Patient not taking: Reported on 05/15/2022) 15 tablet 0  . tamsulosin (FLOMAX) 0.4 MG CAPS capsule Take 1 capsule (0.4 mg total) by mouth daily after breakfast. (Patient not taking: Reported on 05/15/2022) 14 capsule 0   No current facility-administered  medications for this visit.     Marland Kitchen  PHYSICAL EXAMINATION:   Vitals:   06/09/22 1436  BP: 120/86  Pulse: 81  Resp: 16  Temp: 97.7 F (36.5 C)   Filed Weights   06/09/22 1436  Weight: 169 lb 9.6 oz (76.9 kg)    Physical Exam   LABORATORY DATA:  I have reviewed the data as listed Lab Results  Component Value Date   WBC 4.4 06/09/2022   HGB 12.1 06/09/2022   HCT 36.9 06/09/2022   MCV 86.8 06/09/2022   PLT 134 (L) 06/09/2022   Recent Labs    06/19/21 0926 04/07/22 1419 04/14/22 1459 05/15/22 1532 06/09/22 1432  NA 138 134*  --  138 136  K 3.6 3.1* 3.8 4.2 3.5  CL 101 103  --  99 100  CO2 23 27  --  24 29  GLUCOSE 92 103*  --  93 118*  BUN 9 11  --  13 11  CREATININE 0.76 0.66  --  1.13* 0.83  CALCIUM 9.3 8.7*  --  9.3 8.7*  GFRNONAA  --  >60  --   --  >60  PROT 7.0 7.3  --  6.8  --   ALBUMIN 4.6 3.7  --  4.3  --   AST 17 15  --  15  --   ALT 11 11  --  9  --   ALKPHOS 40* 39  --  52  --   BILITOT 0.5 0.3  --  0.5  --      No results found.  ASSESSMENT & PLAN:   Iron deficiency #Iron deficient anemia: 9.8; ferritin 6/iron saturation 6% [April 2023]-poor tolerance to oral iron.  S/p IV iron infusion  #Hemoglobin is 12.3.  As patient symptomatic proceed with Venofer.   #Etiology of iron deficiency: April 2023 colonoscopy negative for any acute chronic blood loss.  Recommend further work-up including EGD /possible capsule study. CT stone protocol-April 2023 negative for any acute process.  # Mild intermittent thrombocytopenia- 130-140s- STABLE.   # History of kidney stones/chronic mild hydronephrosis right side-s/p staged lithotripsy x 2  [ May & June 2023-Dr. Stoioff]-improved.  # DISPOSITION: # venofer today # venofer again in 1-2 weeks # follow up in 4 months- labs- cbc/bmp; iron studies; ferritin; possible venoferDr.B    All questions were answered. The patient knows to call the clinic with any problems, questions or concerns.    [name], MD 06/09/2022 9:40 PM

## 2022-06-10 LAB — HAPTOGLOBIN: Haptoglobin: 89 mg/dL (ref 42–296)

## 2022-06-22 ENCOUNTER — Inpatient Hospital Stay: Payer: BC Managed Care – PPO | Attending: Internal Medicine

## 2022-06-22 MED FILL — Iron Sucrose Inj 20 MG/ML (Fe Equiv): INTRAVENOUS | Qty: 10 | Status: AC

## 2022-07-23 NOTE — Progress Notes (Deleted)
LMP 09/22/2021 (Approximate)    Subjective:    Patient ID: Kari Acosta, female    DOB: [birth year], 46 y.o.   MRN: [id number]  HPI: Kari Acosta is a 46 y.o. female presenting on 07/24/2022 for comprehensive medical examination. Current medical complaints include:{Blank single:19197::"none","***"}  She currently lives with: Menopausal Symptoms: {Blank single:19197::"yes","no"}  HYPOTHYROIDISM Thyroid control status:{Blank single:19197::"controlled","uncontrolled","better","worse","exacerbated","stable"} Satisfied with current treatment? {Blank single:19197::"yes","no"} Medication side effects: {Blank single:19197::"yes","no"} Medication compliance: {Blank single:19197::"excellent compliance","good compliance","fair compliance","poor compliance"} Etiology of hypothyroidism:  Recent dose adjustment:{Blank single:19197::"yes","no"} Fatigue: {Blank single:19197::"yes","no"} Cold intolerance: {Blank single:19197::"yes","no"} Heat intolerance: {Blank single:19197::"yes","no"} Weight gain: {Blank single:19197::"yes","no"} Weight loss: {Blank single:19197::"yes","no"} Constipation: {Blank single:19197::"yes","no"} Diarrhea/loose stools: {Blank single:19197::"yes","no"} Palpitations: {Blank single:19197::"yes","no"} Lower extremity edema: {Blank single:19197::"yes","no"} Anxiety/depressed mood: {Blank single:19197::"yes","no"}   Depression Screen done today and results listed below:     03/13/2022    3:30 PM 10/21/2021   11:09 AM 06/19/2021    8:35 AM  Depression screen PHQ 2/9  Decreased Interest 0 0 0  Down, Depressed, Hopeless 0 0 0  PHQ - 2 Score 0 0 0  Altered sleeping 0 0   Tired, decreased energy 2 1   Change in appetite 0 0   Feeling bad or failure about yourself  0 0   Trouble concentrating 0 0   Moving slowly or fidgety/restless 0 0   Suicidal thoughts 0 0   PHQ-9 Score 2 1   Difficult doing work/chores Not difficult at all Not difficult at all     The  patient {has/does not have:19849} a history of falls. I {did/did not:19850} complete a risk assessment for falls. A plan of care for falls {was/was not:19852} documented.   Past Medical History:  Past Medical History:  Diagnosis Date   Anemia    Complication of anesthesia    difficult to wake up after hysterectomy   Family history of colon cancer in mother    Family history of ovarian cancer 08/2021   cancer genetic testing letter sent   History of kidney stones    Hypothyroidism    Right nephrolithiasis 04/2022    Surgical History:  Past Surgical History:  Procedure Laterality Date   CESAREAN SECTION     CHOLECYSTECTOMY     COLONOSCOPY WITH PROPOFOL N/A 02/26/2022   Procedure: COLONOSCOPY WITH PROPOFOL;  Surgeon: Lucilla Lame, MD;  Location: [facility];  Service: Endoscopy;  Laterality: N/A;   CYSTOSCOPY  09/23/2021   Procedure: CYSTOSCOPY;  Surgeon: Gae Dry, MD;  Location: [facility];  Service: Gynecology;;   CYSTOSCOPY/URETEROSCOPY/HOLMIUM LASER/STENT PLACEMENT Right 05/05/2022   Procedure: CYSTOSCOPY/URETEROSCOPY/HOLMIUM LASER/STENT EXCHANGE;  Surgeon: Abbie Sons, MD;  Location: [facility];  Service: Urology;  Laterality: Right;   CYSTOSCOPY/URETEROSCOPY/HOLMIUM LASER/STENT PLACEMENT Right 04/21/2022   Procedure: CYSTOSCOPY/URETEROSCOPY/HOLMIUM LASER/STENT PLACEMENT;  Surgeon: Abbie Sons, MD;  Location: [facility];  Service: Urology;  Laterality: Right;   LAPAROSCOPIC BILATERAL SALPINGECTOMY Bilateral 09/23/2021   Procedure: LAPAROSCOPIC BILATERAL SALPINGECTOMY;  Surgeon: Gae Dry, MD;  Location: [facility];  Service: Gynecology;  Laterality: Bilateral;   LAPAROSCOPIC SUPRACERVICAL HYSTERECTOMY  09/23/2021   Procedure: LAPAROSCOPIC SUPRACERVICAL HYSTERECTOMY WITH ABLATION OF ENDOMETRIAL CELLS;  Surgeon: Gae Dry, MD;  Location: [facility];  Service: Gynecology;;    Medications:  Current Outpatient Medications on File Prior to Visit  Medication Sig    levothyroxine (SYNTHROID) 50 MCG tablet Take 1 tablet (50 mcg total) by mouth daily.   oxybutynin (DITROPAN) 5 MG tablet 1 tab tid prn frequency,urgency, bladder spasm (Patient not  taking: Reported on 05/15/2022)   oxyCODONE-acetaminophen (PERCOCET/ROXICET) 5-325 MG tablet Take 1 tablet by mouth every 6 (six) hours as needed for severe pain. (Patient not taking: Reported on 05/15/2022)   tamsulosin (FLOMAX) 0.4 MG CAPS capsule Take 1 capsule (0.4 mg total) by mouth daily after breakfast. (Patient not taking: Reported on 05/15/2022)   [DISCONTINUED] prochlorperazine (COMPAZINE) 10 MG tablet Take 1 tablet (10 mg total) by mouth every 8 (eight) hours as needed (headache).   No current facility-administered medications on file prior to visit.    Allergies:  No Known Allergies  Social History:  Social History   Socioeconomic History   Marital status: Significant Other    Spouse name: Not on file   Number of children: 1   Years of education: Not on file   Highest education level: Not on file  Occupational History   Not on file  Tobacco Use   Smoking status: Never   Smokeless tobacco: Never  Vaping Use   Vaping Use: Never used  Substance and Sexual Activity   Alcohol use: No   Drug use: Never   Sexual activity: Yes  Other Topics Concern   Not on file  Social History Narrative   Lives in [place] with boyfriend; and children; no smoking; no alcohol; 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     Social Determinants of Health   Financial Resource Strain: Not on file  Food Insecurity: Not on file  Transportation Needs: Not on file  Physical Activity: Not on file  Stress: Not on file  Social Connections: Not on file  Intimate Partner Violence: Not on file   Social History   Tobacco Use  Smoking Status Never  Smokeless Tobacco Never   Social History   Substance and Sexual Activity  Alcohol Use No    Family History:  Family History  Problem Relation Age of Onset   Colon cancer  Mother 56   Hypertension Mother    Hyperlipidemia Father    Hypertension Father    Stroke Father    Healthy Son    Healthy Son    Ovarian cancer Maternal Aunt    Breast cancer Maternal Grandmother    Cancer Maternal Grandmother        Gallbladder Cancer   Lung cancer Maternal Grandmother    Hypertension Maternal Grandfather    Heart failure Paternal Grandmother    Tuberculosis Paternal Grandfather    Cancer - Other Maternal Great-grandmother        Gallbladder Cancer    Past medical history, surgical history, medications, allergies, family history and social history reviewed with patient today and changes made to appropriate areas of the chart.   ROS All other ROS negative except what is listed above and in the HPI.      Objective:    LMP 09/22/2021 (Approximate)   Wt Readings from Last 3 Encounters:  06/09/22 169 lb 9.6 oz (76.9 kg)  05/15/22 172 lb (78 kg)  05/14/22 170 lb (77.1 kg)    Physical Exam  Results for orders placed or performed in visit on 06/09/22  Reticulocytes  Result Value Ref Range   Retic Ct Pct 0.9 0.4 - 3.1 %   RBC. 4.17 3.87 - 5.11 MIL/uL   Retic Count, Absolute 36.3 19.0 - 186.0 K/uL   Immature Retic Fract 3.8 2.3 - 15.9 %  Haptoglobin  Result Value Ref Range   Haptoglobin 89 42 - 296 mg/dL  Lactate dehydrogenase  Result Value Ref Range   LDH 116 98 -  192 U/L  Basic metabolic panel  Result Value Ref Range   Sodium 136 135 - 145 mmol/L   Potassium 3.5 3.5 - 5.1 mmol/L   Chloride 100 98 - 111 mmol/L   CO2 29 22 - 32 mmol/L   Glucose, Bld 118 (H) 70 - 99 mg/dL   BUN 11 6 - 20 mg/dL   Creatinine, Ser 0.83 0.44 - 1.00 mg/dL   Calcium 8.7 (L) 8.9 - 10.3 mg/dL   GFR, Estimated >60 >60 mL/min   Anion gap 7 5 - 15  CBC with Differential/Platelet  Result Value Ref Range   WBC 4.4 4.0 - 10.5 K/uL   RBC 4.25 3.87 - 5.11 MIL/uL   Hemoglobin 12.1 12.0 - 15.0 g/dL   HCT 36.9 36.0 - 46.0 %   MCV 86.8 80.0 - 100.0 fL   MCH 28.5 26.0 - 34.0 pg    MCHC 32.8 30.0 - 36.0 g/dL   RDW 14.2 11.5 - 15.5 %   Platelets 134 (L) 150 - 400 K/uL   nRBC 0.0 0.0 - 0.2 %   Neutrophils Relative % 55 %   Neutro Abs 2.4 1.7 - 7.7 K/uL   Lymphocytes Relative 32 %   Lymphs Abs 1.4 0.7 - 4.0 K/uL   Monocytes Relative 10 %   Monocytes Absolute 0.4 0.1 - 1.0 K/uL   Eosinophils Relative 2 %   Eosinophils Absolute 0.1 0.0 - 0.5 K/uL   Basophils Relative 1 %   Basophils Absolute 0.1 0.0 - 0.1 K/uL   Immature Granulocytes 0 %   Abs Immature Granulocytes 0.01 0.00 - 0.07 K/uL      Assessment & Plan:   Problem List Items Addressed This Visit   None    Follow up plan: No follow-ups on file.   LABORATORY TESTING:  - Pap smear: {Blank KVQQVZ:56387::"FIE done","not applicable","up to date","done elsewhere"}  IMMUNIZATIONS:   - Tdap: Tetanus vaccination status reviewed: {tetanus status:315746}. - Influenza: {Blank single:19197::"Up to date","Administered today","Postponed to flu season","Refused","Given elsewhere"} - Pneumovax: {Blank single:19197::"Up to date","Administered today","Not applicable","Refused","Given elsewhere"} - Prevnar: {Blank single:19197::"Up to date","Administered today","Not applicable","Refused","Given elsewhere"} - COVID: {Blank single:19197::"Up to date","Administered today","Not applicable","Refused","Given elsewhere"} - HPV: {Blank single:19197::"Up to date","Administered today","Not applicable","Refused","Given elsewhere"} - Shingrix vaccine: {Blank single:19197::"Up to date","Administered today","Not applicable","Refused","Given elsewhere"}  SCREENING: -Mammogram: {Blank single:19197::"Up to date","Ordered today","Not applicable","Refused","Done elsewhere"}  - Colonoscopy: {Blank single:19197::"Up to date","Ordered today","Not applicable","Refused","Done elsewhere"}  - Bone Density: {Blank single:19197::"Up to date","Ordered today","Not applicable","Refused","Done elsewhere"}  -Hearing Test: {Blank single:19197::"Up to  date","Ordered today","Not applicable","Refused","Done elsewhere"}  -Spirometry: {Blank single:19197::"Up to date","Ordered today","Not applicable","Refused","Done elsewhere"}   PATIENT COUNSELING:   Advised to take 1 mg of folate supplement per day if capable of pregnancy.   Sexuality: Discussed sexually transmitted diseases, partner selection, use of condoms, avoidance of unintended pregnancy  and contraceptive alternatives.   Advised to avoid cigarette smoking.  I discussed with the patient that most people either abstain from alcohol or drink within safe limits (<=14/week and <=4 drinks/occasion for males, <=7/weeks and <= 3 drinks/occasion for females) and that the risk for alcohol disorders and other health effects rises proportionally with the number of drinks per week and how often a drinker exceeds daily limits.  Discussed cessation/primary prevention of drug use and availability of treatment for abuse.   Diet: Encouraged to adjust caloric intake to maintain  or achieve ideal body weight, to reduce intake of dietary saturated fat and total fat, to limit sodium intake by avoiding high sodium foods and not adding table salt,  and to maintain adequate dietary potassium and calcium preferably from fresh fruits, vegetables, and low-fat dairy products.    stressed the importance of regular exercise  Injury prevention: Discussed safety belts, safety helmets, smoke detector, smoking near bedding or upholstery.   Dental health: Discussed importance of regular tooth brushing, flossing, and dental visits.    NEXT PREVENTATIVE PHYSICAL DUE IN 1 YEAR. No follow-ups on file.

## 2022-07-24 ENCOUNTER — Encounter: Payer: BC Managed Care – PPO | Admitting: Nurse Practitioner

## 2022-08-13 NOTE — Progress Notes (Deleted)
LMP 09/22/2021 (Approximate)    Subjective:    Patient ID: Kari Acosta, female    DOB: 12/08/1976, 46 y.o.   MRN: [id number]  HPI: Kari Acosta is a 46 y.o. female presenting on 08/18/2022 for comprehensive medical examination. Current medical complaints include:{Blank single:19197::"none","***"}  She currently lives with: Menopausal Symptoms: {Blank single:19197::"yes","no"}  HYPOTHYROIDISM Thyroid control status:{Blank single:19197::"controlled","uncontrolled","better","worse","exacerbated","stable"} Satisfied with current treatment? {Blank single:19197::"yes","no"} Medication side effects: {Blank single:19197::"yes","no"} Medication compliance: {Blank single:19197::"excellent compliance","good compliance","fair compliance","poor compliance"} Etiology of hypothyroidism:  Recent dose adjustment:{Blank single:19197::"yes","no"} Fatigue: {Blank single:19197::"yes","no"} Cold intolerance: {Blank single:19197::"yes","no"} Heat intolerance: {Blank single:19197::"yes","no"} Weight gain: {Blank single:19197::"yes","no"} Weight loss: {Blank single:19197::"yes","no"} Constipation: {Blank single:19197::"yes","no"} Diarrhea/loose stools: {Blank single:19197::"yes","no"} Palpitations: {Blank single:19197::"yes","no"} Lower extremity edema: {Blank single:19197::"yes","no"} Anxiety/depressed mood: {Blank single:19197::"yes","no"}  Depression Screen done today and results listed below:     03/13/2022    3:30 PM 10/21/2021   11:09 AM 06/19/2021    8:35 AM  Depression screen PHQ 2/9  Decreased Interest 0 0 0  Down, Depressed, Hopeless 0 0 0  PHQ - 2 Score 0 0 0  Altered sleeping 0 0   Tired, decreased energy 2 1   Change in appetite 0 0   Feeling bad or failure about yourself  0 0   Trouble concentrating 0 0   Moving slowly or fidgety/restless 0 0   Suicidal thoughts 0 0   PHQ-9 Score 2 1   Difficult doing work/chores Not difficult at all Not difficult at all     The patient  {has/does not have:19849} a history of falls. I {did/did not:19850} complete a risk assessment for falls. A plan of care for falls {was/was not:19852} documented.   Past Medical History:  Past Medical History:  Diagnosis Date   Anemia    Complication of anesthesia    difficult to wake up after hysterectomy   Family history of colon cancer in mother    Family history of ovarian cancer 08/2021   cancer genetic testing letter sent   History of kidney stones    Hypothyroidism    Right nephrolithiasis 04/2022    Surgical History:  Past Surgical History:  Procedure Laterality Date   CESAREAN SECTION     CHOLECYSTECTOMY     COLONOSCOPY WITH PROPOFOL N/A 02/26/2022   Procedure: COLONOSCOPY WITH PROPOFOL;  Surgeon: Lucilla Lame, MD;  Location: [facility];  Service: Endoscopy;  Laterality: N/A;   CYSTOSCOPY  09/23/2021   Procedure: CYSTOSCOPY;  Surgeon: Gae Dry, MD;  Location: [facility];  Service: Gynecology;;   CYSTOSCOPY/URETEROSCOPY/HOLMIUM LASER/STENT PLACEMENT Right 05/05/2022   Procedure: CYSTOSCOPY/URETEROSCOPY/HOLMIUM LASER/STENT EXCHANGE;  Surgeon: Abbie Sons, MD;  Location: [facility];  Service: Urology;  Laterality: Right;   CYSTOSCOPY/URETEROSCOPY/HOLMIUM LASER/STENT PLACEMENT Right 04/21/2022   Procedure: CYSTOSCOPY/URETEROSCOPY/HOLMIUM LASER/STENT PLACEMENT;  Surgeon: Abbie Sons, MD;  Location: [facility];  Service: Urology;  Laterality: Right;   LAPAROSCOPIC BILATERAL SALPINGECTOMY Bilateral 09/23/2021   Procedure: LAPAROSCOPIC BILATERAL SALPINGECTOMY;  Surgeon: Gae Dry, MD;  Location: [facility];  Service: Gynecology;  Laterality: Bilateral;   LAPAROSCOPIC SUPRACERVICAL HYSTERECTOMY  09/23/2021   Procedure: LAPAROSCOPIC SUPRACERVICAL HYSTERECTOMY WITH ABLATION OF ENDOMETRIAL CELLS;  Surgeon: Gae Dry, MD;  Location: [facility];  Service: Gynecology;;    Medications:  Current Outpatient Medications on File Prior to Visit  Medication Sig    levothyroxine (SYNTHROID) 50 MCG tablet Take 1 tablet (50 mcg total) by mouth daily.   [DISCONTINUED] prochlorperazine (COMPAZINE) 10 MG tablet Take 1 tablet (10 mg total) by mouth every  8 (eight) hours as needed (headache).   No current facility-administered medications on file prior to visit.    Allergies:  No Known Allergies  Social History:  Social History   Socioeconomic History   Marital status: Significant Other    Spouse name: Not on file   Number of children: 1   Years of education: Not on file   Highest education level: Not on file  Occupational History   Not on file  Tobacco Use   Smoking status: Never   Smokeless tobacco: Never  Vaping Use   Vaping Use: Never used  Substance and Sexual Activity   Alcohol use: No   Drug use: Never   Sexual activity: Yes  Other Topics Concern   Not on file  Social History Narrative   Lives in [place] with boyfriend; and children; no smoking; no alcohol; 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     Social Determinants of Health   Financial Resource Strain: Not on file  Food Insecurity: Not on file  Transportation Needs: Not on file  Physical Activity: Not on file  Stress: Not on file  Social Connections: Not on file  Intimate Partner Violence: Not on file   Social History   Tobacco Use  Smoking Status Never  Smokeless Tobacco Never   Social History   Substance and Sexual Activity  Alcohol Use No    Family History:  Family History  Problem Relation Age of Onset   Colon cancer Mother 76   Hypertension Mother    Hyperlipidemia Father    Hypertension Father    Stroke Father    Healthy Son    Healthy Son    Ovarian cancer Maternal Aunt    Breast cancer Maternal Grandmother    Cancer Maternal Grandmother        Gallbladder Cancer   Lung cancer Maternal Grandmother    Hypertension Maternal Grandfather    Heart failure Paternal Grandmother    Tuberculosis Paternal Grandfather    Cancer - Other Maternal  Great-grandmother        Gallbladder Cancer    Past medical history, surgical history, medications, allergies, family history and social history reviewed with patient today and changes made to appropriate areas of the chart.   ROS All other ROS negative except what is listed above and in the HPI.      Objective:    LMP 09/22/2021 (Approximate)   Wt Readings from Last 3 Encounters:  06/09/22 169 lb 9.6 oz (76.9 kg)  05/15/22 172 lb (78 kg)  05/14/22 170 lb (77.1 kg)    Physical Exam  Results for orders placed or performed in visit on 06/09/22  Reticulocytes  Result Value Ref Range   Retic Ct Pct 0.9 0.4 - 3.1 %   RBC. 4.17 3.87 - 5.11 MIL/uL   Retic Count, Absolute 36.3 19.0 - 186.0 K/uL   Immature Retic Fract 3.8 2.3 - 15.9 %  Haptoglobin  Result Value Ref Range   Haptoglobin 89 42 - 296 mg/dL  Lactate dehydrogenase  Result Value Ref Range   LDH 116 98 - 192 U/L  Basic metabolic panel  Result Value Ref Range   Sodium 136 135 - 145 mmol/L   Potassium 3.5 3.5 - 5.1 mmol/L   Chloride 100 98 - 111 mmol/L   CO2 29 22 - 32 mmol/L   Glucose, Bld 118 (H) 70 - 99 mg/dL   BUN 11 6 - 20 mg/dL   Creatinine, Ser 0.83 0.44 - 1.00 mg/dL  Calcium 8.7 (L) 8.9 - 10.3 mg/dL   GFR, Estimated >60 >60 mL/min   Anion gap 7 5 - 15  CBC with Differential/Platelet  Result Value Ref Range   WBC 4.4 4.0 - 10.5 K/uL   RBC 4.25 3.87 - 5.11 MIL/uL   Hemoglobin 12.1 12.0 - 15.0 g/dL   HCT 36.9 36.0 - 46.0 %   MCV 86.8 80.0 - 100.0 fL   MCH 28.5 26.0 - 34.0 pg   MCHC 32.8 30.0 - 36.0 g/dL   RDW 14.2 11.5 - 15.5 %   Platelets 134 (L) 150 - 400 K/uL   nRBC 0.0 0.0 - 0.2 %   Neutrophils Relative % 55 %   Neutro Abs 2.4 1.7 - 7.7 K/uL   Lymphocytes Relative 32 %   Lymphs Abs 1.4 0.7 - 4.0 K/uL   Monocytes Relative 10 %   Monocytes Absolute 0.4 0.1 - 1.0 K/uL   Eosinophils Relative 2 %   Eosinophils Absolute 0.1 0.0 - 0.5 K/uL   Basophils Relative 1 %   Basophils Absolute 0.1 0.0 - 0.1  K/uL   Immature Granulocytes 0 %   Abs Immature Granulocytes 0.01 0.00 - 0.07 K/uL      Assessment & Plan:   Problem List Items Addressed This Visit       Endocrine   Hypothyroidism     Other   Iron deficiency   Other Visit Diagnoses     Annual physical exam    -  Primary   Screening for ischemic heart disease            Follow up plan: No follow-ups on file.   LABORATORY TESTING:  - Pap smear: {Blank [id number]::"PFX done","not applicable","up to date","done elsewhere"}  IMMUNIZATIONS:   - Tdap: Tetanus vaccination status reviewed: {tetanus status:315746}. - Influenza: {Blank single:19197::"Up to date","Administered today","Postponed to flu season","Refused","Given elsewhere"} - Pneumovax: {Blank single:19197::"Up to date","Administered today","Not applicable","Refused","Given elsewhere"} - Prevnar: {Blank single:19197::"Up to date","Administered today","Not applicable","Refused","Given elsewhere"} - COVID: {Blank single:19197::"Up to date","Administered today","Not applicable","Refused","Given elsewhere"} - HPV: {Blank single:19197::"Up to date","Administered today","Not applicable","Refused","Given elsewhere"} - Shingrix vaccine: {Blank single:19197::"Up to date","Administered today","Not applicable","Refused","Given elsewhere"}  SCREENING: -Mammogram: {Blank single:19197::"Up to date","Ordered today","Not applicable","Refused","Done elsewhere"}  - Colonoscopy: {Blank single:19197::"Up to date","Ordered today","Not applicable","Refused","Done elsewhere"}  - Bone Density: {Blank single:19197::"Up to date","Ordered today","Not applicable","Refused","Done elsewhere"}  -Hearing Test: {Blank single:19197::"Up to date","Ordered today","Not applicable","Refused","Done elsewhere"}  -Spirometry: {Blank single:19197::"Up to date","Ordered today","Not applicable","Refused","Done elsewhere"}   PATIENT COUNSELING:   Advised to take 1 mg of folate supplement per day if capable  of pregnancy.   Sexuality: Discussed sexually transmitted diseases, partner selection, use of condoms, avoidance of unintended pregnancy  and contraceptive alternatives.   Advised to avoid cigarette smoking.  I discussed with the patient that most people either abstain from alcohol or drink within safe limits (<=14/week and <=4 drinks/occasion for males, <=7/weeks and <= 3 drinks/occasion for females) and that the risk for alcohol disorders and other health effects rises proportionally with the number of drinks per week and how often a drinker exceeds daily limits.  Discussed cessation/primary prevention of drug use and availability of treatment for abuse.   Diet: Encouraged to adjust caloric intake to maintain  or achieve ideal body weight, to reduce intake of dietary saturated fat and total fat, to limit sodium intake by avoiding high sodium foods and not adding table salt, and to maintain adequate dietary potassium and calcium preferably from fresh fruits, vegetables, and low-fat dairy products.    stressed the importance  of regular exercise  Injury prevention: Discussed safety belts, safety helmets, smoke detector, smoking near bedding or upholstery.   Dental health: Discussed importance of regular tooth brushing, flossing, and dental visits.    NEXT PREVENTATIVE PHYSICAL DUE IN 1 YEAR. No follow-ups on file.

## 2022-08-14 IMAGING — US US BREAST*R* LIMITED INC AXILLA
1 series · 2 of 2 positions shown · non-contrast
Comparison: Baseline mammogram 07/04/2021.

CLINICAL DATA: Recall from baseline screening mammography, possible
focal asymmetry and subtle distortion in the UPPER INNER QUADRANT of
the RIGHT breast and possible asymmetry with subtle distortion in
the inner LEFT breast visible only on the CC view.

EXAM:
DIGITAL DIAGNOSTIC BILATERAL MAMMOGRAM WITH TOMOSYNTHESIS AND CAD;
ULTRASOUND RIGHT BREAST LIMITED
TECHNIQUE: Bilateral digital diagnostic mammography and breast tomosynthesis
was performed. The images were evaluated with computer-aided
detection.; Targeted ultrasound examination of the right breast was
performed

[Series 1: us breast*right* limited inc axilla · 0.06mm/px · 2 of 2 slices shown]
[im 1/2]
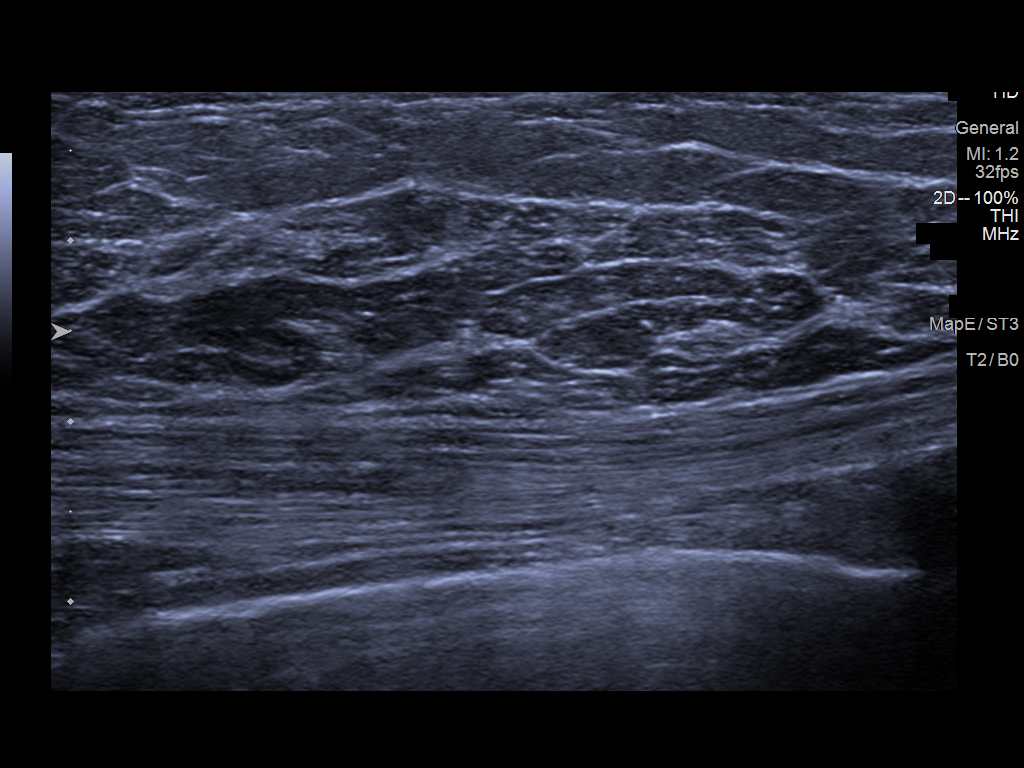
[im 2/2]
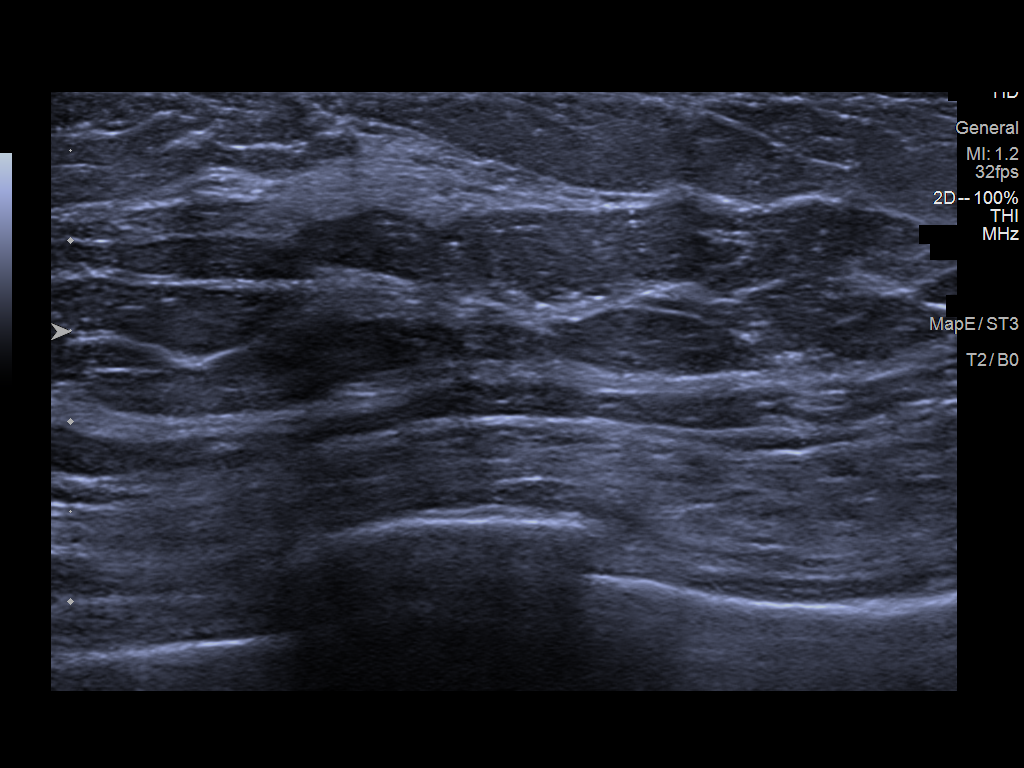

[2 of 2 positions shown; findings below may reference images not displayed]

ACR Breast Density Category b: There are scattered areas of
fibroglandular density.
FINDINGS: Spot-compression CC and MLO views of the area of concern in the
RIGHT breast, spot compression CC view of the area of concern in the
LEFT breast, and full field mediolateral views of both breasts were
obtained.

RIGHT: The focal asymmetry in the UPPER INNER QUADRANT partially
disperses with compression on the CC view and completely disperses
with compression in the MLO view, likely indicating overlapping
fibroglandular tissue. There is no underlying architectural
distortion as questioned on screening mammography.

Targeted ultrasound is performed, demonstrating an island of
fibroglandular tissue at the 2 o'clock position approximately 3 cm
from the nipple with the same morphology as the mammographic
asymmetry. No cyst, solid mass, abnormal acoustic shadowing or
architectural distortion is identified.

LEFT: The asymmetry in the inner breast on screening mammography
completely disperses with compression, indicating overlapping
fibroglandular tissue. There is no underlying mass or architectural
distortion. No findings suspicious for malignancy.
IMPRESSION: 1. No mammographic or sonographic evidence of malignancy involving
the RIGHT breast.
2. No mammographic evidence of malignancy involving the LEFT breast.

RECOMMENDATION:
Screening mammogram in one year.(Code:48-5-AHD)

I have discussed the findings and recommendations with the patient.
If applicable, a reminder letter will be sent to the patient
regarding the next appointment.

BI-RADS CATEGORY  1: Negative.

## 2022-08-14 IMAGING — MG DIGITAL DIAGNOSTIC BILAT W/ TOMO W/ CAD
6 of 10 series · 6 of 30 positions shown · non-contrast
Comparison: Baseline mammogram 07/04/2021.

CLINICAL DATA: Recall from baseline screening mammography, possible
focal asymmetry and subtle distortion in the UPPER INNER QUADRANT of
the RIGHT breast and possible asymmetry with subtle distortion in
the inner LEFT breast visible only on the CC view.

EXAM:
DIGITAL DIAGNOSTIC BILATERAL MAMMOGRAM WITH TOMOSYNTHESIS AND CAD;
ULTRASOUND RIGHT BREAST LIMITED
TECHNIQUE: Bilateral digital diagnostic mammography and breast tomosynthesis
was performed. The images were evaluated with computer-aided
detection.; Targeted ultrasound examination of the right breast was
performed

[L ML synth-2D]
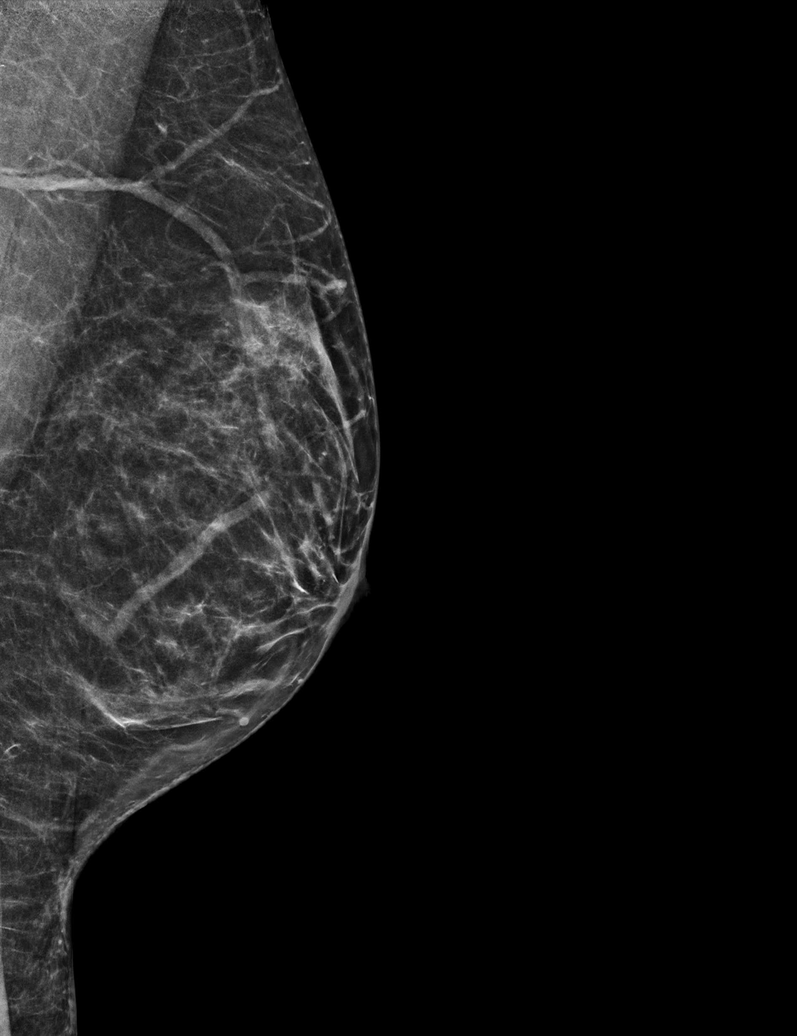

[R ML synth-2D]
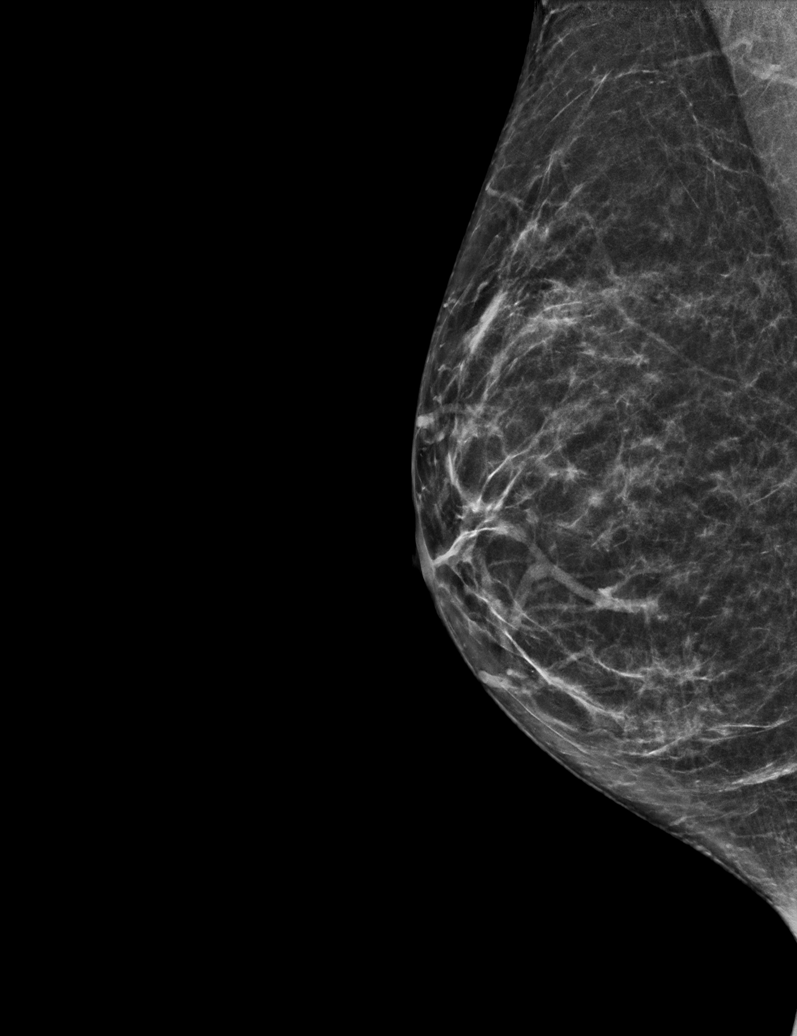

[R CC synth-2D]
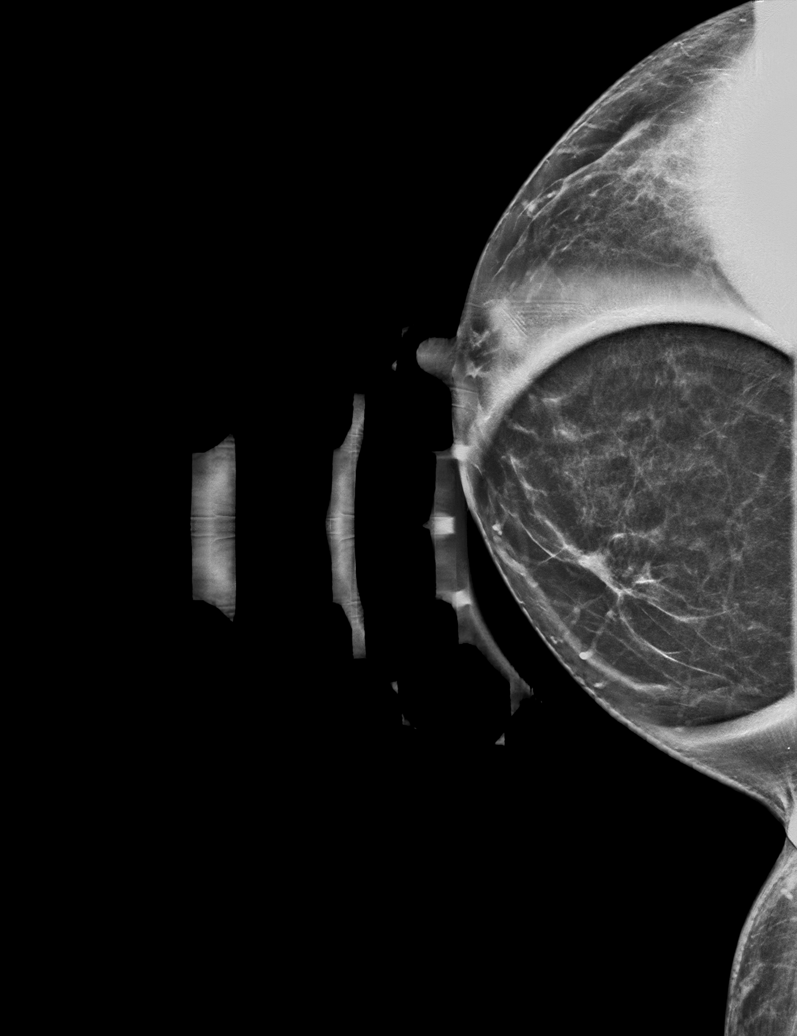

[R MLO synth-2D]
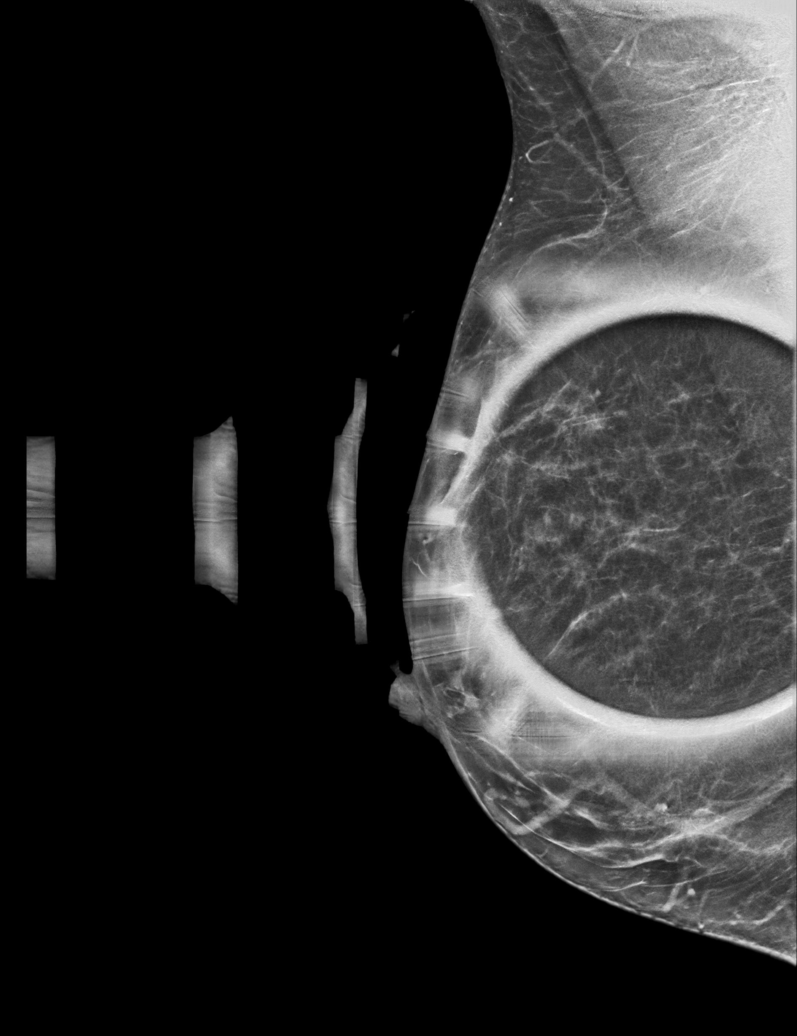

[L CC synth-2D]
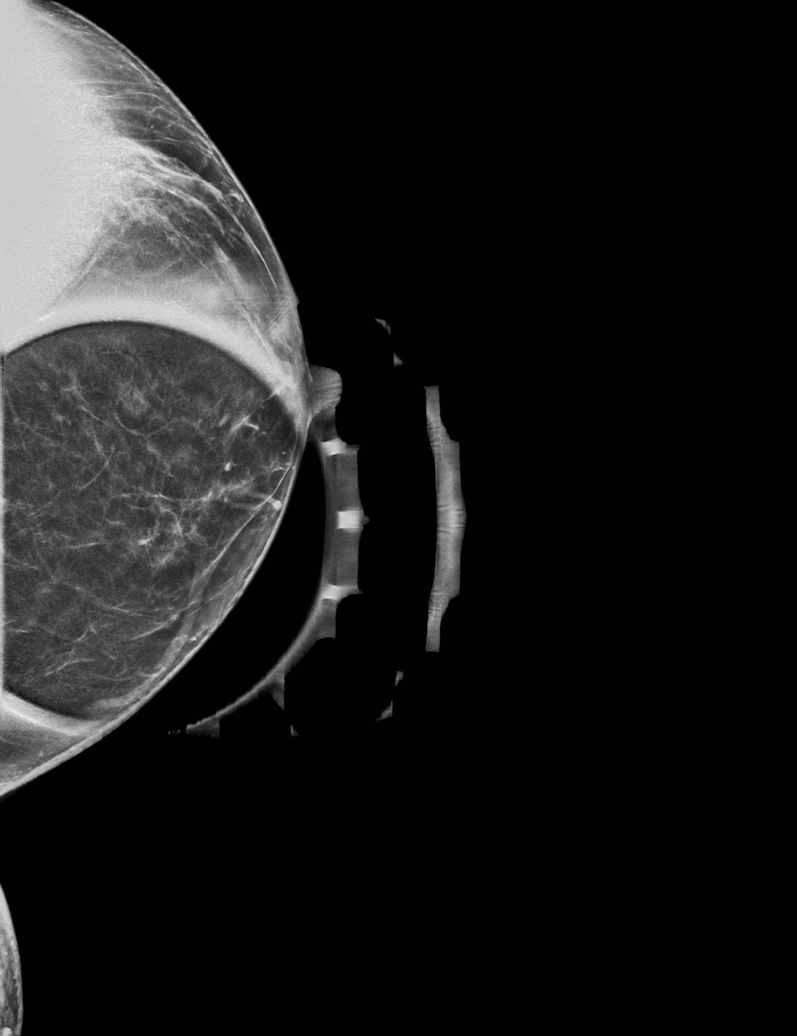

[R MLO tomo · tomo slice 29/57.0]
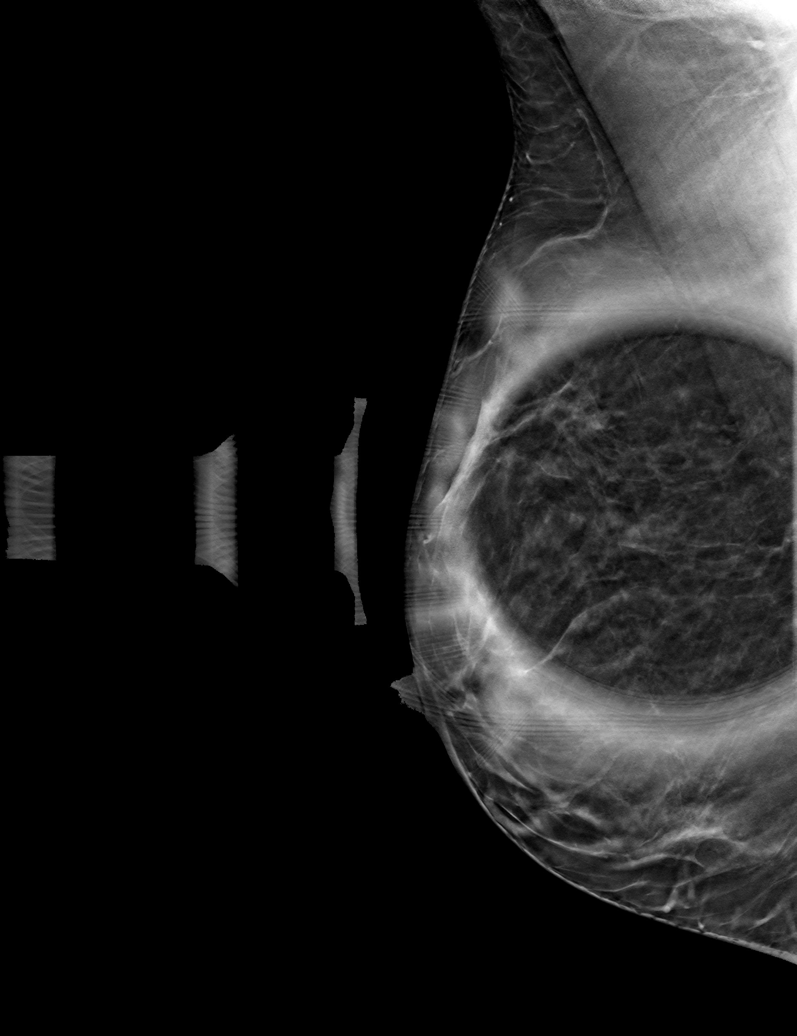

[6 of 30 positions shown; findings below may reference images not displayed]

ACR Breast Density Category b: There are scattered areas of
fibroglandular density.
FINDINGS: Spot-compression CC and MLO views of the area of concern in the
RIGHT breast, spot compression CC view of the area of concern in the
LEFT breast, and full field mediolateral views of both breasts were
obtained.

RIGHT: The focal asymmetry in the UPPER INNER QUADRANT partially
disperses with compression on the CC view and completely disperses
with compression in the MLO view, likely indicating overlapping
fibroglandular tissue. There is no underlying architectural
distortion as questioned on screening mammography.

Targeted ultrasound is performed, demonstrating an island of
fibroglandular tissue at the 2 o'clock position approximately 3 cm
from the nipple with the same morphology as the mammographic
asymmetry. No cyst, solid mass, abnormal acoustic shadowing or
architectural distortion is identified.

LEFT: The asymmetry in the inner breast on screening mammography
completely disperses with compression, indicating overlapping
fibroglandular tissue. There is no underlying mass or architectural
distortion. No findings suspicious for malignancy.
IMPRESSION: 1. No mammographic or sonographic evidence of malignancy involving
the RIGHT breast.
2. No mammographic evidence of malignancy involving the LEFT breast.

RECOMMENDATION:
Screening mammogram in one year.(Code:48-5-AHD)

I have discussed the findings and recommendations with the patient.
If applicable, a reminder letter will be sent to the patient
regarding the next appointment.

BI-RADS CATEGORY  1: Negative.

## 2022-08-18 ENCOUNTER — Encounter: Payer: BC Managed Care – PPO | Admitting: Nurse Practitioner

## 2022-08-18 DIAGNOSIS — Z136 Encounter for screening for cardiovascular disorders: Secondary | ICD-10-CM

## 2022-08-18 DIAGNOSIS — E039 Hypothyroidism, unspecified: Secondary | ICD-10-CM

## 2022-08-18 DIAGNOSIS — Z0000 Encounter for general adult medical examination without abnormal findings: Secondary | ICD-10-CM

## 2022-08-18 DIAGNOSIS — E611 Iron deficiency: Secondary | ICD-10-CM

## 2022-09-04 ENCOUNTER — Encounter: Payer: BC Managed Care – PPO | Admitting: Nurse Practitioner

## 2022-09-08 ENCOUNTER — Encounter: Payer: BC Managed Care – PPO | Admitting: Nurse Practitioner

## 2022-09-09 ENCOUNTER — Encounter: Payer: BC Managed Care – PPO | Admitting: Nurse Practitioner

## 2022-09-09 NOTE — Progress Notes (Deleted)
LMP 09/22/2021 (Approximate)    Subjective:    Patient ID: Kari Acosta, female    DOB: May 09, 1976, 46 y.o.   MRN: [id number]  HPI: Kari Acosta is a 46 y.o. female presenting on 09/09/2022 for comprehensive medical examination. Current medical complaints include:{Blank single:19197::"none","***"}  She currently lives with: Menopausal Symptoms: {Blank single:19197::"yes","no"}  HYPOTHYROIDISM Thyroid control status:{Blank single:19197::"controlled","uncontrolled","better","worse","exacerbated","stable"} Satisfied with current treatment? {Blank single:19197::"yes","no"} Medication side effects: {Blank single:19197::"yes","no"} Medication compliance: {Blank single:19197::"excellent compliance","good compliance","fair compliance","poor compliance"} Etiology of hypothyroidism:  Recent dose adjustment:{Blank single:19197::"yes","no"} Fatigue: {Blank single:19197::"yes","no"} Cold intolerance: {Blank single:19197::"yes","no"} Heat intolerance: {Blank single:19197::"yes","no"} Weight gain: {Blank single:19197::"yes","no"} Weight loss: {Blank single:19197::"yes","no"} Constipation: {Blank single:19197::"yes","no"} Diarrhea/loose stools: {Blank single:19197::"yes","no"} Palpitations: {Blank single:19197::"yes","no"} Lower extremity edema: {Blank single:19197::"yes","no"} Anxiety/depressed mood: {Blank single:19197::"yes","no"}  Depression Screen done today and results listed below:     03/13/2022    3:30 PM 10/21/2021   11:09 AM 06/19/2021    8:35 AM  Depression screen PHQ 2/9  Decreased Interest 0 0 0  Down, Depressed, Hopeless 0 0 0  PHQ - 2 Score 0 0 0  Altered sleeping 0 0   Tired, decreased energy 2 1   Change in appetite 0 0   Feeling bad or failure about yourself  0 0   Trouble concentrating 0 0   Moving slowly or fidgety/restless 0 0   Suicidal thoughts 0 0   PHQ-9 Score 2 1   Difficult doing work/chores Not difficult at all Not difficult at all     The patient  {has/does not have:19849} a history of falls. I {did/did not:19850} complete a risk assessment for falls. A plan of care for falls {was/was not:19852} documented.   Past Medical History:  Past Medical History:  Diagnosis Date   Anemia    Complication of anesthesia    difficult to wake up after hysterectomy   Family history of colon cancer in mother    Family history of ovarian cancer 08/2021   cancer genetic testing letter sent   History of kidney stones    Hypothyroidism    Right nephrolithiasis 04/2022    Surgical History:  Past Surgical History:  Procedure Laterality Date   CESAREAN SECTION     CHOLECYSTECTOMY     COLONOSCOPY WITH PROPOFOL N/A 02/26/2022   Procedure: COLONOSCOPY WITH PROPOFOL;  Surgeon: Lucilla Lame, MD;  Location: [facility];  Service: Endoscopy;  Laterality: N/A;   CYSTOSCOPY  09/23/2021   Procedure: CYSTOSCOPY;  Surgeon: Gae Dry, MD;  Location: [facility];  Service: Gynecology;;   CYSTOSCOPY/URETEROSCOPY/HOLMIUM LASER/STENT PLACEMENT Right 05/05/2022   Procedure: CYSTOSCOPY/URETEROSCOPY/HOLMIUM LASER/STENT EXCHANGE;  Surgeon: Abbie Sons, MD;  Location: [facility];  Service: Urology;  Laterality: Right;   CYSTOSCOPY/URETEROSCOPY/HOLMIUM LASER/STENT PLACEMENT Right 04/21/2022   Procedure: CYSTOSCOPY/URETEROSCOPY/HOLMIUM LASER/STENT PLACEMENT;  Surgeon: Abbie Sons, MD;  Location: [facility];  Service: Urology;  Laterality: Right;   LAPAROSCOPIC BILATERAL SALPINGECTOMY Bilateral 09/23/2021   Procedure: LAPAROSCOPIC BILATERAL SALPINGECTOMY;  Surgeon: Gae Dry, MD;  Location: [facility];  Service: Gynecology;  Laterality: Bilateral;   LAPAROSCOPIC SUPRACERVICAL HYSTERECTOMY  09/23/2021   Procedure: LAPAROSCOPIC SUPRACERVICAL HYSTERECTOMY WITH ABLATION OF ENDOMETRIAL CELLS;  Surgeon: Gae Dry, MD;  Location: [facility];  Service: Gynecology;;    Medications:  Current Outpatient Medications on File Prior to Visit  Medication Sig    levothyroxine (SYNTHROID) 50 MCG tablet Take 1 tablet (50 mcg total) by mouth daily.   [DISCONTINUED] prochlorperazine (COMPAZINE) 10 MG tablet Take 1 tablet (10 mg total) by mouth every  8 (eight) hours as needed (headache).   No current facility-administered medications on file prior to visit.    Allergies:  No Known Allergies  Social History:  Social History   Socioeconomic History   Marital status: Significant Other    Spouse name: Not on file   Number of children: 1   Years of education: Not on file   Highest education level: Not on file  Occupational History   Not on file  Tobacco Use   Smoking status: Never   Smokeless tobacco: Never  Vaping Use   Vaping Use: Never used  Substance and Sexual Activity   Alcohol use: No   Drug use: Never   Sexual activity: Yes  Other Topics Concern   Not on file  Social History Narrative   Lives in [place] with boyfriend; and children; no smoking; no alcohol; 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     Social Determinants of Health   Financial Resource Strain: Not on file  Food Insecurity: Not on file  Transportation Needs: Not on file  Physical Activity: Not on file  Stress: Not on file  Social Connections: Not on file  Intimate Partner Violence: Not on file   Social History   Tobacco Use  Smoking Status Never  Smokeless Tobacco Never   Social History   Substance and Sexual Activity  Alcohol Use No    Family History:  Family History  Problem Relation Age of Onset   Colon cancer Mother 38   Hypertension Mother    Hyperlipidemia Father    Hypertension Father    Stroke Father    Healthy Son    Healthy Son    Ovarian cancer Maternal Aunt    Breast cancer Maternal Grandmother    Cancer Maternal Grandmother        Gallbladder Cancer   Lung cancer Maternal Grandmother    Hypertension Maternal Grandfather    Heart failure Paternal Grandmother    Tuberculosis Paternal Grandfather    Cancer - Other Maternal  Great-grandmother        Gallbladder Cancer    Past medical history, surgical history, medications, allergies, family history and social history reviewed with patient today and changes made to appropriate areas of the chart.   ROS All other ROS negative except what is listed above and in the HPI.      Objective:    LMP 09/22/2021 (Approximate)   Wt Readings from Last 3 Encounters:  06/09/22 169 lb 9.6 oz (76.9 kg)  05/15/22 172 lb (78 kg)  05/14/22 170 lb (77.1 kg)    Physical Exam  Results for orders placed or performed in visit on 06/09/22  Reticulocytes  Result Value Ref Range   Retic Ct Pct 0.9 0.4 - 3.1 %   RBC. 4.17 3.87 - 5.11 MIL/uL   Retic Count, Absolute 36.3 19.0 - 186.0 K/uL   Immature Retic Fract 3.8 2.3 - 15.9 %  Haptoglobin  Result Value Ref Range   Haptoglobin 89 42 - 296 mg/dL  Lactate dehydrogenase  Result Value Ref Range   LDH 116 98 - 192 U/L  Basic metabolic panel  Result Value Ref Range   Sodium 136 135 - 145 mmol/L   Potassium 3.5 3.5 - 5.1 mmol/L   Chloride 100 98 - 111 mmol/L   CO2 29 22 - 32 mmol/L   Glucose, Bld 118 (H) 70 - 99 mg/dL   BUN 11 6 - 20 mg/dL   Creatinine, Ser 0.83 0.44 - 1.00 mg/dL  Calcium 8.7 (L) 8.9 - 10.3 mg/dL   GFR, Estimated >60 >60 mL/min   Anion gap 7 5 - 15  CBC with Differential/Platelet  Result Value Ref Range   WBC 4.4 4.0 - 10.5 K/uL   RBC 4.25 3.87 - 5.11 MIL/uL   Hemoglobin 12.1 12.0 - 15.0 g/dL   HCT 36.9 36.0 - 46.0 %   MCV 86.8 80.0 - 100.0 fL   MCH 28.5 26.0 - 34.0 pg   MCHC 32.8 30.0 - 36.0 g/dL   RDW 14.2 11.5 - 15.5 %   Platelets 134 (L) 150 - 400 K/uL   nRBC 0.0 0.0 - 0.2 %   Neutrophils Relative % 55 %   Neutro Abs 2.4 1.7 - 7.7 K/uL   Lymphocytes Relative 32 %   Lymphs Abs 1.4 0.7 - 4.0 K/uL   Monocytes Relative 10 %   Monocytes Absolute 0.4 0.1 - 1.0 K/uL   Eosinophils Relative 2 %   Eosinophils Absolute 0.1 0.0 - 0.5 K/uL   Basophils Relative 1 %   Basophils Absolute 0.1 0.0 - 0.1  K/uL   Immature Granulocytes 0 %   Abs Immature Granulocytes 0.01 0.00 - 0.07 K/uL      Assessment & Plan:   Problem List Items Addressed This Visit       Endocrine   Hypothyroidism - Primary   Other Visit Diagnoses     Annual physical exam       Screening for ischemic heart disease            Follow up plan: No follow-ups on file.   LABORATORY TESTING:  - Pap smear: {Blank OINOMV:67209::"OBS done","not applicable","up to date","done elsewhere"}  IMMUNIZATIONS:   - Tdap: Tetanus vaccination status reviewed: {tetanus status:315746}. - Influenza: {Blank single:19197::"Up to date","Administered today","Postponed to flu season","Refused","Given elsewhere"} - Pneumovax: {Blank single:19197::"Up to date","Administered today","Not applicable","Refused","Given elsewhere"} - Prevnar: {Blank single:19197::"Up to date","Administered today","Not applicable","Refused","Given elsewhere"} - COVID: {Blank single:19197::"Up to date","Administered today","Not applicable","Refused","Given elsewhere"} - HPV: {Blank single:19197::"Up to date","Administered today","Not applicable","Refused","Given elsewhere"} - Shingrix vaccine: {Blank single:19197::"Up to date","Administered today","Not applicable","Refused","Given elsewhere"}  SCREENING: -Mammogram: {Blank single:19197::"Up to date","Ordered today","Not applicable","Refused","Done elsewhere"}  - Colonoscopy: {Blank single:19197::"Up to date","Ordered today","Not applicable","Refused","Done elsewhere"}  - Bone Density: {Blank single:19197::"Up to date","Ordered today","Not applicable","Refused","Done elsewhere"}  -Hearing Test: {Blank single:19197::"Up to date","Ordered today","Not applicable","Refused","Done elsewhere"}  -Spirometry: {Blank single:19197::"Up to date","Ordered today","Not applicable","Refused","Done elsewhere"}   PATIENT COUNSELING:   Advised to take 1 mg of folate supplement per day if capable of pregnancy.   Sexuality:  Discussed sexually transmitted diseases, partner selection, use of condoms, avoidance of unintended pregnancy  and contraceptive alternatives.   Advised to avoid cigarette smoking.  I discussed with the patient that most people either abstain from alcohol or drink within safe limits (<=14/week and <=4 drinks/occasion for males, <=7/weeks and <= 3 drinks/occasion for females) and that the risk for alcohol disorders and other health effects rises proportionally with the number of drinks per week and how often a drinker exceeds daily limits.  Discussed cessation/primary prevention of drug use and availability of treatment for abuse.   Diet: Encouraged to adjust caloric intake to maintain  or achieve ideal body weight, to reduce intake of dietary saturated fat and total fat, to limit sodium intake by avoiding high sodium foods and not adding table salt, and to maintain adequate dietary potassium and calcium preferably from fresh fruits, vegetables, and low-fat dairy products.    stressed the importance of regular exercise  Injury prevention: Discussed safety belts,  safety helmets, smoke detector, smoking near bedding or upholstery.   Dental health: Discussed importance of regular tooth brushing, flossing, and dental visits.    NEXT PREVENTATIVE PHYSICAL DUE IN 1 YEAR. No follow-ups on file.

## 2022-09-25 ENCOUNTER — Encounter: Payer: BC Managed Care – PPO | Admitting: Nurse Practitioner

## 2022-09-28 MED FILL — Iron Sucrose Inj 20 MG/ML (Fe Equiv): INTRAVENOUS | Qty: 10 | Status: AC

## 2022-09-29 ENCOUNTER — Inpatient Hospital Stay: Payer: BC Managed Care – PPO

## 2022-09-29 ENCOUNTER — Inpatient Hospital Stay: Payer: BC Managed Care – PPO | Admitting: Internal Medicine

## 2022-10-02 ENCOUNTER — Other Ambulatory Visit: Payer: BC Managed Care – PPO

## 2022-10-02 ENCOUNTER — Ambulatory Visit: Payer: BC Managed Care – PPO

## 2022-10-02 ENCOUNTER — Ambulatory Visit: Payer: BC Managed Care – PPO | Admitting: Internal Medicine

## 2022-10-06 ENCOUNTER — Encounter: Payer: Self-pay | Admitting: Nurse Practitioner

## 2022-10-06 ENCOUNTER — Other Ambulatory Visit (HOSPITAL_COMMUNITY)
Admission: RE | Admit: 2022-10-06 | Discharge: 2022-10-06 | Disposition: A | Discharge status: Home / Self Care | Payer: BC Managed Care – PPO | Source: Ambulatory Visit | Attending: Nurse Practitioner | Admitting: Nurse Practitioner

## 2022-10-06 ENCOUNTER — Ambulatory Visit (INDEPENDENT_AMBULATORY_CARE_PROVIDER_SITE_OTHER): Payer: BC Managed Care – PPO | Admitting: Nurse Practitioner

## 2022-10-06 VITALS — BP 135/86 | HR 93 | Temp 98.4°F | Ht 68.25 in | Wt 162.5 lb

## 2022-10-06 DIAGNOSIS — R8290 Unspecified abnormal findings in urine: Secondary | ICD-10-CM

## 2022-10-06 DIAGNOSIS — E039 Hypothyroidism, unspecified: Secondary | ICD-10-CM | POA: Diagnosis not present

## 2022-10-06 DIAGNOSIS — E611 Iron deficiency: Secondary | ICD-10-CM | POA: Diagnosis not present

## 2022-10-06 DIAGNOSIS — Z1231 Encounter for screening mammogram for malignant neoplasm of breast: Secondary | ICD-10-CM

## 2022-10-06 DIAGNOSIS — Z0000 Encounter for general adult medical examination without abnormal findings: Secondary | ICD-10-CM | POA: Insufficient documentation

## 2022-10-06 NOTE — Assessment & Plan Note (Signed)
Chronic.  Controlled.  Continue with current medication regimen of Levothyroxine 74mg.  TSH slightly elevated at last visit. Given patient's fatigue can increase dose if TSH still elevated.  Labs ordered today.  Return to clinic in 3 months for reevaluation.  Call sooner if concerns arise.

## 2022-10-06 NOTE — Assessment & Plan Note (Signed)
Chronic. Labs ordered today.  Continue to follow up with Hematology.

## 2022-10-06 NOTE — Progress Notes (Signed)
BP 135/86   Pulse 93   Temp 98.4 F (36.9 C) (Oral)   Ht 5' 8.25" (1.734 m)   Wt 162 lb 8 oz (73.7 kg)   LMP 09/22/2021 (Approximate)   SpO2 98%   BMI 24.53 kg/m    Subjective:    Patient ID: Kari Acosta, female    DOB: 1976/05/02, 46 y.o.   MRN: [id number]  HPI: Kari Acosta is a 46 y.o. female presenting on 10/06/2022 for comprehensive medical examination. Current medical complaints include:none  She currently lives with: Menopausal Symptoms: no  HYPOTHYROIDISM Thyroid control status:stable Satisfied with current treatment? yes Medication side effects: no Medication compliance: excellent compliance Etiology of hypothyroidism:  Recent dose adjustment:no Fatigue: yes Cold intolerance: yes Heat intolerance: no Weight gain: no Weight loss: no Constipation: yes Diarrhea/loose stools: no Palpitations: no Lower extremity edema: no Anxiety/depressed mood: no  Depression Screen done today and results listed below:     10/06/2022    4:43 PM 03/13/2022    3:30 PM 10/21/2021   11:09 AM 06/19/2021    8:35 AM  Depression screen PHQ 2/9  Decreased Interest 1 0 0 0  Down, Depressed, Hopeless 0 0 0 0  PHQ - 2 Score 1 0 0 0  Altered sleeping 1 0 0   Tired, decreased energy '1 2 1   '$ Change in appetite 0 0 0   Feeling bad or failure about yourself  0 0 0   Trouble concentrating 0 0 0   Moving slowly or fidgety/restless 0 0 0   Suicidal thoughts 0 0 0   PHQ-9 Score '3 2 1   '$ Difficult doing work/chores Not difficult at all Not difficult at all Not difficult at all     The patient does not have a history of falls. I did complete a risk assessment for falls. A plan of care for falls was documented.   Past Medical History:  Past Medical History:  Diagnosis Date   Anemia    Complication of anesthesia    difficult to wake up after hysterectomy   Family history of colon cancer in mother    Family history of ovarian cancer 08/2021   cancer genetic testing letter sent    History of kidney stones    Hypothyroidism    Right nephrolithiasis 04/2022    Surgical History:  Past Surgical History:  Procedure Laterality Date   CESAREAN SECTION     CHOLECYSTECTOMY     COLONOSCOPY WITH PROPOFOL N/A 02/26/2022   Procedure: COLONOSCOPY WITH PROPOFOL;  Surgeon: Lucilla Lame, MD;  Location: [facility];  Service: Endoscopy;  Laterality: N/A;   CYSTOSCOPY  09/23/2021   Procedure: CYSTOSCOPY;  Surgeon: Gae Dry, MD;  Location: [facility];  Service: Gynecology;;   CYSTOSCOPY/URETEROSCOPY/HOLMIUM LASER/STENT PLACEMENT Right 05/05/2022   Procedure: CYSTOSCOPY/URETEROSCOPY/HOLMIUM LASER/STENT EXCHANGE;  Surgeon: Abbie Sons, MD;  Location: [facility];  Service: Urology;  Laterality: Right;   CYSTOSCOPY/URETEROSCOPY/HOLMIUM LASER/STENT PLACEMENT Right 04/21/2022   Procedure: CYSTOSCOPY/URETEROSCOPY/HOLMIUM LASER/STENT PLACEMENT;  Surgeon: Abbie Sons, MD;  Location: [facility];  Service: Urology;  Laterality: Right;   LAPAROSCOPIC BILATERAL SALPINGECTOMY Bilateral 09/23/2021   Procedure: LAPAROSCOPIC BILATERAL SALPINGECTOMY;  Surgeon: Gae Dry, MD;  Location: [facility];  Service: Gynecology;  Laterality: Bilateral;   LAPAROSCOPIC SUPRACERVICAL HYSTERECTOMY  09/23/2021   Procedure: LAPAROSCOPIC SUPRACERVICAL HYSTERECTOMY WITH ABLATION OF ENDOMETRIAL CELLS;  Surgeon: Gae Dry, MD;  Location: [facility];  Service: Gynecology;;    Medications:  Current Outpatient Medications on File  Prior to Visit  Medication Sig   levothyroxine (SYNTHROID) 50 MCG tablet Take 1 tablet (50 mcg total) by mouth daily.   [DISCONTINUED] prochlorperazine (COMPAZINE) 10 MG tablet Take 1 tablet (10 mg total) by mouth every 8 (eight) hours as needed (headache).   No current facility-administered medications on file prior to visit.    Allergies:  No Known Allergies  Social History:  Social History   Socioeconomic History   Marital status: Significant Other    Spouse  name: Not on file   Number of children: 1   Years of education: Not on file   Highest education level: Not on file  Occupational History   Not on file  Tobacco Use   Smoking status: Never   Smokeless tobacco: Never  Vaping Use   Vaping Use: Never used  Substance and Sexual Activity   Alcohol use: No   Drug use: Never   Sexual activity: Yes  Other Topics Concern   Not on file  Social History Narrative   Lives in [place] with boyfriend; and children; no smoking; no alcohol; 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     Social Determinants of Health   Financial Resource Strain: Not on file  Food Insecurity: Not on file  Transportation Needs: Not on file  Physical Activity: Not on file  Stress: Not on file  Social Connections: Not on file  Intimate Partner Violence: Not on file   Social History   Tobacco Use  Smoking Status Never  Smokeless Tobacco Never   Social History   Substance and Sexual Activity  Alcohol Use No    Family History:  Family History  Problem Relation Age of Onset   Colon cancer Mother 71   Hypertension Mother    Hyperlipidemia Father    Hypertension Father    Stroke Father    Healthy Son    Healthy Son    Ovarian cancer Maternal Aunt    Breast cancer Maternal Grandmother    Cancer Maternal Grandmother        Gallbladder Cancer   Lung cancer Maternal Grandmother    Hypertension Maternal Grandfather    Heart failure Paternal Grandmother    Tuberculosis Paternal Grandfather    Cancer - Other Maternal Great-grandmother        Gallbladder Cancer    Past medical history, surgical history, medications, allergies, family history and social history reviewed with patient today and changes made to appropriate areas of the chart.   Review of Systems  Constitutional:  Positive for malaise/fatigue. Negative for weight loss.  Cardiovascular:  Negative for palpitations and leg swelling.  Gastrointestinal:  Positive for constipation. Negative for  diarrhea.  Endo/Heme/Allergies:        Cold intolerance  Psychiatric/Behavioral:  Negative for depression. The patient is not nervous/anxious.    All other ROS negative except what is listed above and in the HPI.      Objective:    BP 135/86   Pulse 93   Temp 98.4 F (36.9 C) (Oral)   Ht 5' 8.25" (1.734 m)   Wt 162 lb 8 oz (73.7 kg)   LMP 09/22/2021 (Approximate)   SpO2 98%   BMI 24.53 kg/m   Wt Readings from Last 3 Encounters:  10/06/22 162 lb 8 oz (73.7 kg)  06/09/22 169 lb 9.6 oz (76.9 kg)  05/15/22 172 lb (78 kg)    Physical Exam Vitals and nursing note reviewed. Exam conducted with a chaperone present Yvonna Alanis, [place]).  Constitutional:  General: She is awake. She is not in acute distress.    Appearance: Normal appearance. She is well-developed and normal weight. She is not ill-appearing.  HENT:     Head: Normocephalic and atraumatic.     Right Ear: Hearing, tympanic membrane, ear canal and external ear normal. No drainage.     Left Ear: Hearing, tympanic membrane, ear canal and external ear normal. No drainage.     Nose: Nose normal.     Right Sinus: No maxillary sinus tenderness or frontal sinus tenderness.     Left Sinus: No maxillary sinus tenderness or frontal sinus tenderness.     Mouth/Throat:     Mouth: Mucous membranes are moist.     Pharynx: Oropharynx is clear. Uvula midline. No pharyngeal swelling, oropharyngeal exudate or posterior oropharyngeal erythema.  Eyes:     General: Lids are normal.        Right eye: No discharge.        Left eye: No discharge.     Extraocular Movements: Extraocular movements intact.     Conjunctiva/sclera: Conjunctivae normal.     Pupils: Pupils are equal, round, and reactive to light.     Visual Fields: Right eye visual fields normal and left eye visual fields normal.  Neck:     Thyroid: No thyromegaly.     Vascular: No carotid bruit.     Trachea: Trachea normal.  Cardiovascular:     Rate and Rhythm: Normal  rate and regular rhythm.     Heart sounds: Normal heart sounds. No murmur heard.    No gallop.  Pulmonary:     Effort: Pulmonary effort is normal. No accessory muscle usage or respiratory distress.     Breath sounds: Normal breath sounds.  Chest:  Breasts:    Right: Normal.     Left: Normal.  Abdominal:     General: Bowel sounds are normal.     Palpations: Abdomen is soft. There is no hepatomegaly or splenomegaly.     Tenderness: There is no abdominal tenderness.  Genitourinary:    General: Normal vulva.     Vagina: Normal.     Cervix: Normal.  Musculoskeletal:        General: Normal range of motion.     Cervical back: Normal range of motion and neck supple.     Right lower leg: No edema.     Left lower leg: No edema.  Lymphadenopathy:     Head:     Right side of head: No submental, submandibular, tonsillar, preauricular or posterior auricular adenopathy.     Left side of head: No submental, submandibular, tonsillar, preauricular or posterior auricular adenopathy.     Cervical: No cervical adenopathy.     Upper Body:     Right upper body: No supraclavicular, axillary or pectoral adenopathy.     Left upper body: No supraclavicular, axillary or pectoral adenopathy.  Skin:    General: Skin is warm and dry.     Capillary Refill: Capillary refill takes less than 2 seconds.     Findings: No rash.  Neurological:     Mental Status: She is alert and oriented to person, place, and time.     Gait: Gait is intact.     Deep Tendon Reflexes: Reflexes are normal and symmetric.     Reflex Scores:      Brachioradialis reflexes are 2+ on the right side and 2+ on the left side.      Patellar reflexes are 2+ on the  right side and 2+ on the left side. Psychiatric:        Attention and Perception: Attention normal.        Mood and Affect: Mood normal.        Speech: Speech normal.        Behavior: Behavior normal. Behavior is cooperative.        Thought Content: Thought content normal.         Judgment: Judgment normal.     Results for orders placed or performed in visit on 06/09/22  Reticulocytes  Result Value Ref Range   Retic Ct Pct 0.9 0.4 - 3.1 %   RBC. 4.17 3.87 - 5.11 MIL/uL   Retic Count, Absolute 36.3 19.0 - 186.0 K/uL   Immature Retic Fract 3.8 2.3 - 15.9 %  Haptoglobin  Result Value Ref Range   Haptoglobin 89 42 - 296 mg/dL  Lactate dehydrogenase  Result Value Ref Range   LDH 116 98 - 192 U/L  Basic metabolic panel  Result Value Ref Range   Sodium 136 135 - 145 mmol/L   Potassium 3.5 3.5 - 5.1 mmol/L   Chloride 100 98 - 111 mmol/L   CO2 29 22 - 32 mmol/L   Glucose, Bld 118 (H) 70 - 99 mg/dL   BUN 11 6 - 20 mg/dL   Creatinine, Ser 0.83 0.44 - 1.00 mg/dL   Calcium 8.7 (L) 8.9 - 10.3 mg/dL   GFR, Estimated >60 >60 mL/min   Anion gap 7 5 - 15  CBC with Differential/Platelet  Result Value Ref Range   WBC 4.4 4.0 - 10.5 K/uL   RBC 4.25 3.87 - 5.11 MIL/uL   Hemoglobin 12.1 12.0 - 15.0 g/dL   HCT 36.9 36.0 - 46.0 %   MCV 86.8 80.0 - 100.0 fL   MCH 28.5 26.0 - 34.0 pg   MCHC 32.8 30.0 - 36.0 g/dL   RDW 14.2 11.5 - 15.5 %   Platelets 134 (L) 150 - 400 K/uL   nRBC 0.0 0.0 - 0.2 %   Neutrophils Relative % 55 %   Neutro Abs 2.4 1.7 - 7.7 K/uL   Lymphocytes Relative 32 %   Lymphs Abs 1.4 0.7 - 4.0 K/uL   Monocytes Relative 10 %   Monocytes Absolute 0.4 0.1 - 1.0 K/uL   Eosinophils Relative 2 %   Eosinophils Absolute 0.1 0.0 - 0.5 K/uL   Basophils Relative 1 %   Basophils Absolute 0.1 0.0 - 0.1 K/uL   Immature Granulocytes 0 %   Abs Immature Granulocytes 0.01 0.00 - 0.07 K/uL      Assessment & Plan:   Problem List Items Addressed This Visit       Endocrine   Hypothyroidism - Primary    Chronic.  Controlled.  Continue with current medication regimen of Levothyroxine 23mg.  TSH slightly elevated at last visit. Given patient's fatigue can increase dose if TSH still elevated.  Labs ordered today.  Return to clinic in 3 months for reevaluation.  Call  sooner if concerns arise.        Relevant Orders   TSH   T4, free     Other   Iron deficiency    Chronic. Labs ordered today.  Continue to follow up with Hematology.        Relevant Orders   Lipid panel   Other Visit Diagnoses     Annual physical exam       Relevant Orders   CBC with Differential/Platelet  Comprehensive metabolic panel   Lipid panel   TSH   Urinalysis, Routine w reflex microscopic   Cytology - PAP   T4, free   Encounter for screening mammogram for malignant neoplasm of breast       Relevant Orders   MM 3D SCREEN BREAST BILATERAL        Follow up plan: Return in about 6 months (around 04/07/2023) for HTN, HLD, DM2 FU.   LABORATORY TESTING:  - Pap smear: pap done  IMMUNIZATIONS:   - Tdap: Tetanus vaccination status reviewed: last tetanus booster within 10 years. - Influenza: Refused - Pneumovax: Not applicable - Prevnar: Not applicable - COVID: Not applicable - HPV: Not applicable - Shingrix vaccine: Not applicable  SCREENING: -Mammogram: Ordered today  - Colonoscopy: Not applicable  - Bone Density: Not applicable  -Hearing Test: Not applicable  -Spirometry: Not applicable   PATIENT COUNSELING:   Advised to take 1 mg of folate supplement per day if capable of pregnancy.   Sexuality: Discussed sexually transmitted diseases, partner selection, use of condoms, avoidance of unintended pregnancy  and contraceptive alternatives.   Advised to avoid cigarette smoking.  I discussed with the patient that most people either abstain from alcohol or drink within safe limits (<=14/week and <=4 drinks/occasion for males, <=7/weeks and <= 3 drinks/occasion for females) and that the risk for alcohol disorders and other health effects rises proportionally with the number of drinks per week and how often a drinker exceeds daily limits.  Discussed cessation/primary prevention of drug use and availability of treatment for abuse.   Diet: Encouraged to  adjust caloric intake to maintain  or achieve ideal body weight, to reduce intake of dietary saturated fat and total fat, to limit sodium intake by avoiding high sodium foods and not adding table salt, and to maintain adequate dietary potassium and calcium preferably from fresh fruits, vegetables, and low-fat dairy products.    stressed the importance of regular exercise  Injury prevention: Discussed safety belts, safety helmets, smoke detector, smoking near bedding or upholstery.   Dental health: Discussed importance of regular tooth brushing, flossing, and dental visits.    NEXT PREVENTATIVE PHYSICAL DUE IN 1 YEAR. Return in about 6 months (around 04/07/2023) for HTN, HLD, DM2 FU.

## 2022-10-07 DIAGNOSIS — R8290 Unspecified abnormal findings in urine: Secondary | ICD-10-CM | POA: Diagnosis not present

## 2022-10-07 LAB — COMPREHENSIVE METABOLIC PANEL
ALT: 14 IU/L (ref 0–32)
AST: 19 IU/L (ref 0–40)
Albumin/Globulin Ratio: 1.7 (ref 1.2–2.2)
Albumin: 4.3 g/dL (ref 3.9–4.9)
Alkaline Phosphatase: 46 IU/L (ref 44–121)
BUN/Creatinine Ratio: 10 (ref 9–23)
BUN: 8 mg/dL (ref 6–24)
Bilirubin Total: 0.8 mg/dL (ref 0.0–1.2)
CO2: 27 mmol/L (ref 20–29)
Calcium: 9.6 mg/dL (ref 8.7–10.2)
Chloride: 101 mmol/L (ref 96–106)
Creatinine, Ser: 0.83 mg/dL (ref 0.57–1.00)
Globulin, Total: 2.6 g/dL (ref 1.5–4.5)
Glucose: 87 mg/dL (ref 70–99)
Potassium: 3.3 mmol/L — ABNORMAL LOW (ref 3.5–5.2)
Sodium: 142 mmol/L (ref 134–144)
Total Protein: 6.9 g/dL (ref 6.0–8.5)
eGFR: 89 mL/min/{1.73_m2} (ref >59)

## 2022-10-07 LAB — URINALYSIS, ROUTINE W REFLEX MICROSCOPIC
Bilirubin, UA: NEGATIVE
Glucose, UA: NEGATIVE
Ketones, UA: NEGATIVE
Nitrite, UA: NEGATIVE
Specific Gravity, UA: 1.02 (ref 1.005–1.030)
Urobilinogen, Ur: 2 mg/dL — ABNORMAL HIGH (ref 0.2–1.0)
pH, UA: 7 (ref 5.0–7.5)

## 2022-10-07 LAB — CBC WITH DIFFERENTIAL/PLATELET
Basophils Absolute: 0 10*3/uL (ref 0.0–0.2)
Basos: 1 %
EOS (ABSOLUTE): 0.1 10*3/uL (ref 0.0–0.4)
Eos: 1 %
Hematocrit: 38.3 % (ref 34.0–46.6)
Hemoglobin: 12.9 g/dL (ref 11.1–15.9)
Immature Grans (Abs): 0 10*3/uL (ref 0.0–0.1)
Immature Granulocytes: 0 %
Lymphocytes Absolute: 1.6 10*3/uL (ref 0.7–3.1)
Lymphs: 28 %
MCH: 29.4 pg (ref 26.6–33.0)
MCHC: 33.7 g/dL (ref 31.5–35.7)
MCV: 87 fL (ref 79–97)
Monocytes Absolute: 0.6 10*3/uL (ref 0.1–0.9)
Monocytes: 11 %
Neutrophils Absolute: 3.3 10*3/uL (ref 1.4–7.0)
Neutrophils: 59 %
Platelets: 137 10*3/uL — ABNORMAL LOW (ref 150–450)
RBC: 4.39 x10E6/uL (ref 3.77–5.28)
RDW: 11.9 % (ref 11.7–15.4)
WBC: 5.5 10*3/uL (ref 3.4–10.8)

## 2022-10-07 LAB — LIPID PANEL
Chol/HDL Ratio: 2.3 ratio (ref 0.0–4.4)
Cholesterol, Total: 155 mg/dL (ref 100–199)
HDL: 68 mg/dL (ref >39)
LDL Chol Calc (NIH): 76 mg/dL (ref 0–99)
Triglycerides: 49 mg/dL (ref 0–149)
VLDL Cholesterol Cal: 11 mg/dL (ref 5–40)

## 2022-10-07 LAB — MICROSCOPIC EXAMINATION: Bacteria, UA: NONE SEEN

## 2022-10-07 LAB — TSH: TSH: 3.19 u[IU]/mL (ref 0.450–4.500)

## 2022-10-07 LAB — T4, FREE: Free T4: 1.25 ng/dL (ref 0.82–1.77)

## 2022-10-07 NOTE — Progress Notes (Signed)
Hi Kari Acosta. It was good ot see you yesterday.  Your lab work shows that your anemia has improved.  Your liver, kidneys, and electrolytes look good.  Your urine was abnormal.  I have sent it out for culture to make sure you don't need to be treated for a UTI.    Your thyroid labs are within normal range. I would like to continue with the current dose at this time.  Follow up as discussed.

## 2022-10-07 NOTE — Addendum Note (Signed)
Addended by: Jon Billings on: 10/07/2022 08:02 AM   Modules accepted: Orders

## 2022-10-09 LAB — URINE CULTURE

## 2022-10-09 NOTE — Progress Notes (Signed)
Hi Kari Acosta.  Your urine culture did not grow any bacteria.  No need for antibiotic treatment at this time.

## 2022-10-12 ENCOUNTER — Encounter (INDEPENDENT_AMBULATORY_CARE_PROVIDER_SITE_OTHER): Payer: Self-pay

## 2022-10-12 LAB — CYTOLOGY - PAP
Adequacy: ABSENT
Diagnosis: NEGATIVE

## 2022-10-12 NOTE — Progress Notes (Signed)
Hi Bellina.  Your PAP looks good.  No concerns at this time.  We will repeat in 3 years.

## 2022-10-15 MED FILL — Iron Sucrose Inj 20 MG/ML (Fe Equiv): INTRAVENOUS | Qty: 10 | Status: AC

## 2022-10-16 ENCOUNTER — Inpatient Hospital Stay: Payer: BC Managed Care – PPO

## 2022-10-16 ENCOUNTER — Inpatient Hospital Stay: Payer: BC Managed Care – PPO | Admitting: Internal Medicine

## 2022-11-13 ENCOUNTER — Encounter: Payer: Self-pay | Admitting: Internal Medicine

## 2022-11-13 ENCOUNTER — Inpatient Hospital Stay: Payer: BC Managed Care – PPO | Attending: Internal Medicine

## 2022-11-13 ENCOUNTER — Inpatient Hospital Stay (HOSPITAL_BASED_OUTPATIENT_CLINIC_OR_DEPARTMENT_OTHER): Payer: BC Managed Care – PPO | Admitting: Internal Medicine

## 2022-11-13 ENCOUNTER — Inpatient Hospital Stay: Payer: BC Managed Care – PPO

## 2022-11-13 VITALS — BP 134/102 | HR 80 | Temp 98.7°F | Resp 20 | Wt 164.9 lb

## 2022-11-13 DIAGNOSIS — Z9079 Acquired absence of other genital organ(s): Secondary | ICD-10-CM | POA: Diagnosis not present

## 2022-11-13 DIAGNOSIS — D509 Iron deficiency anemia, unspecified: Secondary | ICD-10-CM | POA: Insufficient documentation

## 2022-11-13 DIAGNOSIS — Z9049 Acquired absence of other specified parts of digestive tract: Secondary | ICD-10-CM | POA: Diagnosis not present

## 2022-11-13 DIAGNOSIS — E611 Iron deficiency: Secondary | ICD-10-CM

## 2022-11-13 DIAGNOSIS — Z87442 Personal history of urinary calculi: Secondary | ICD-10-CM | POA: Diagnosis not present

## 2022-11-13 DIAGNOSIS — Z90710 Acquired absence of both cervix and uterus: Secondary | ICD-10-CM | POA: Diagnosis not present

## 2022-11-13 DIAGNOSIS — D696 Thrombocytopenia, unspecified: Secondary | ICD-10-CM | POA: Insufficient documentation

## 2022-11-13 LAB — CBC WITH DIFFERENTIAL/PLATELET
Abs Immature Granulocytes: 0.02 10*3/uL (ref 0.00–0.07)
Basophils Absolute: 0 10*3/uL (ref 0.0–0.1)
Basophils Relative: 1 %
Eosinophils Absolute: 0.1 10*3/uL (ref 0.0–0.5)
Eosinophils Relative: 2 %
HCT: 36.3 % (ref 36.0–46.0)
Hemoglobin: 12.3 g/dL (ref 12.0–15.0)
Immature Granulocytes: 0 %
Lymphocytes Relative: 32 %
Lymphs Abs: 1.5 10*3/uL (ref 0.7–4.0)
MCH: 30.4 pg (ref 26.0–34.0)
MCHC: 33.9 g/dL (ref 30.0–36.0)
MCV: 89.6 fL (ref 80.0–100.0)
Monocytes Absolute: 0.5 10*3/uL (ref 0.1–1.0)
Monocytes Relative: 11 %
Neutro Abs: 2.6 10*3/uL (ref 1.7–7.7)
Neutrophils Relative %: 54 %
Platelets: 166 10*3/uL (ref 150–400)
RBC: 4.05 MIL/uL (ref 3.87–5.11)
RDW: 12.6 % (ref 11.5–15.5)
WBC: 4.8 10*3/uL (ref 4.0–10.5)
nRBC: 0 % (ref 0.0–0.2)

## 2022-11-13 LAB — BASIC METABOLIC PANEL
Anion gap: 6 (ref 5–15)
BUN: 8 mg/dL (ref 6–20)
CO2: 29 mmol/L (ref 22–32)
Calcium: 8.8 mg/dL — ABNORMAL LOW (ref 8.9–10.3)
Chloride: 105 mmol/L (ref 98–111)
Creatinine, Ser: 0.76 mg/dL (ref 0.44–1.00)
GFR, Estimated: >60 mL/min (ref >60)
Glucose, Bld: 94 mg/dL (ref 70–99)
Potassium: 3.4 mmol/L — ABNORMAL LOW (ref 3.5–5.1)
Sodium: 140 mmol/L (ref 135–145)

## 2022-11-13 LAB — IRON AND TIBC
Iron: 68 ug/dL (ref 28–170)
Saturation Ratios: 24 % (ref 10.4–31.8)
TIBC: 279 ug/dL (ref 250–450)
UIBC: 211 ug/dL

## 2022-11-13 LAB — FERRITIN: Ferritin: 31 ng/mL (ref 11–307)

## 2022-11-13 MED ORDER — SODIUM CHLORIDE 0.9 % IV SOLN
200.0000 mg | Freq: Once | INTRAVENOUS | Status: AC
  Administered 2022-11-13: 200 mg via INTRAVENOUS
  Filled 2022-11-13: qty 200

## 2022-11-13 MED ORDER — SODIUM CHLORIDE 0.9 % IV SOLN
Freq: Once | INTRAVENOUS | Status: AC
  Filled 2022-11-13: qty 250

## 2022-11-13 NOTE — Progress Notes (Signed)
Patient states she is still staying cold all the time.

## 2022-11-13 NOTE — Assessment & Plan Note (Addendum)
#  Iron deficient anemia [hx TAH]: 9.8; ferritin 6/iron saturation 6% [April 2023]-poor tolerance to oral iron.  S/p IV iron infusion   #June 2023 hemoglobin is 12.3.  As patient symptomatic proceed with Venofer.   #Etiology of iron deficiency: April 2023 colonoscopy negative for any acute chronic blood loss.  Recommend further work-up including EGD /possible capsule study. Discussed with pt to conact Dr.Wohl's office. CT stone protocol-April 2023 negative for any acute process.  # Mild intermittent thrombocytopenia- 130-140s- STABLE.   # History of kidney stones/chronic mild hydronephrosis right side-s/p staged lithotripsy x 2  [ May & June 2023-Dr. Stoioff]-improved.  # DISPOSITION: # venofer today # follow up in 4 months- labs- cbc/bmp; iron studies; ferritin; possible venofer-Dr.B

## 2022-11-13 NOTE — Patient Instructions (Signed)

## 2022-11-13 NOTE — Progress Notes (Signed)
[facility] NOTE  Patient Care Team: Jon Billings, NP as PCP - General (Nurse Practitioner) Cammie Sickle, MD as Consulting Physician (Oncology)  CHIEF COMPLAINTS/PURPOSE OF CONSULTATION: ANEMIA   HEMATOLOGY HISTORY  # ANEMIA[Hb-9; Iron sat; ferritin- 6%; Iron sat-6 [PCP] GFR-wnl; CR hematuria-colonoscopy-April 2023 [Dr.Wohl] NEG; EGD/capsulke- none; Oral iron: none sec  Constipation/dyspepsia  # TAH oct 2022; Right hydroKidney stones [Dr.Stoioff- lithotripys pening]  HISTORY OF PRESENTING ILLNESS: Ambulating independently.  Alone.  Kari Acosta 46 y.o.  female pleasant patient with iron deficiency anemia unclear etiology is here for follow-up.  Patient states she is still staying cold all the time. Patient s/p IV iron infusion noted to have improvement of energy levels.  Review of Systems  Constitutional:  Positive for malaise/fatigue. Negative for chills, diaphoresis, fever and weight loss.  HENT:  Negative for nosebleeds and sore throat.   Eyes:  Negative for double vision.  Respiratory:  Positive for shortness of breath. Negative for cough, hemoptysis, sputum production and wheezing.   Cardiovascular:  Negative for chest pain, palpitations, orthopnea and leg swelling.  Gastrointestinal:  Negative for abdominal pain, blood in stool, constipation, diarrhea, heartburn, melena, nausea and vomiting.  Genitourinary:  Negative for dysuria, frequency and urgency.  Musculoskeletal:  Positive for myalgias. Negative for joint pain.  Skin: Negative.  Negative for itching and rash.  Neurological:  Positive for dizziness, tingling and headaches. Negative for focal weakness and weakness.  Endo/Heme/Allergies:  Does not bruise/bleed easily.  Psychiatric/Behavioral:  Negative for depression. The patient is not nervous/anxious and does not have insomnia.      MEDICAL HISTORY:  Past Medical History:  Diagnosis Date   Anemia    Complication of  anesthesia    difficult to wake up after hysterectomy   Family history of colon cancer in mother    Family history of ovarian cancer 08/2021   cancer genetic testing letter sent   History of kidney stones    Hypothyroidism    Right nephrolithiasis 04/2022    SURGICAL HISTORY: Past Surgical History:  Procedure Laterality Date   CESAREAN SECTION     CHOLECYSTECTOMY     COLONOSCOPY WITH PROPOFOL N/A 02/26/2022   Procedure: COLONOSCOPY WITH PROPOFOL;  Surgeon: Lucilla Lame, MD;  Location: [facility];  Service: Endoscopy;  Laterality: N/A;   CYSTOSCOPY  09/23/2021   Procedure: CYSTOSCOPY;  Surgeon: Gae Dry, MD;  Location: [facility];  Service: Gynecology;;   CYSTOSCOPY/URETEROSCOPY/HOLMIUM LASER/STENT PLACEMENT Right 05/05/2022   Procedure: CYSTOSCOPY/URETEROSCOPY/HOLMIUM LASER/STENT EXCHANGE;  Surgeon: Abbie Sons, MD;  Location: [facility];  Service: Urology;  Laterality: Right;   CYSTOSCOPY/URETEROSCOPY/HOLMIUM LASER/STENT PLACEMENT Right 04/21/2022   Procedure: CYSTOSCOPY/URETEROSCOPY/HOLMIUM LASER/STENT PLACEMENT;  Surgeon: Abbie Sons, MD;  Location: [facility];  Service: Urology;  Laterality: Right;   LAPAROSCOPIC BILATERAL SALPINGECTOMY Bilateral 09/23/2021   Procedure: LAPAROSCOPIC BILATERAL SALPINGECTOMY;  Surgeon: Gae Dry, MD;  Location: [facility];  Service: Gynecology;  Laterality: Bilateral;   LAPAROSCOPIC SUPRACERVICAL HYSTERECTOMY  09/23/2021   Procedure: LAPAROSCOPIC SUPRACERVICAL HYSTERECTOMY WITH ABLATION OF ENDOMETRIAL CELLS;  Surgeon: Gae Dry, MD;  Location: [facility];  Service: Gynecology;;    SOCIAL HISTORY: Social History   Socioeconomic History   Marital status: Significant Other    Spouse name: Not on file   Number of children: 1   Years of education: Not on file   Highest education level: Not on file  Occupational History   Not on file  Tobacco Use   Smoking status: Never  Smokeless tobacco: Never  Vaping Use   Vaping  Use: Never used  Substance and Sexual Activity   Alcohol use: No   Drug use: Never   Sexual activity: Yes  Other Topics Concern   Not on file  Social History Narrative   Lives in [place] with boyfriend; and children; no smoking; no alcohol; 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     Social Determinants of Health   Financial Resource Strain: Not on file  Food Insecurity: Not on file  Transportation Needs: Not on file  Physical Activity: Not on file  Stress: Not on file  Social Connections: Not on file  Intimate Partner Violence: Not on file    FAMILY HISTORY: Family History  Problem Relation Age of Onset   Colon cancer Mother 45   Hypertension Mother    Hyperlipidemia Father    Hypertension Father    Stroke Father    Healthy Son    Healthy Son    Ovarian cancer Maternal Aunt    Breast cancer Maternal Grandmother    Cancer Maternal Grandmother        Gallbladder Cancer   Lung cancer Maternal Grandmother    Hypertension Maternal Grandfather    Heart failure Paternal Grandmother    Tuberculosis Paternal Grandfather    Cancer - Other Maternal Great-grandmother        Gallbladder Cancer    ALLERGIES:  has No Known Allergies.  MEDICATIONS:  Current Outpatient Medications  Medication Sig Dispense Refill   levothyroxine (SYNTHROID) 50 MCG tablet Take 1 tablet (50 mcg total) by mouth daily. 90 tablet 1   No current facility-administered medications for this visit.     Marland Kitchen  PHYSICAL EXAMINATION:   Vitals:   11/13/22 1444  BP: (!) 134/102  Pulse: 80  Resp: 20  Temp: 98.7 F (37.1 C)  SpO2: 100%   Filed Weights   11/13/22 1444  Weight: 164 lb 14.4 oz (74.8 kg)    Physical Exam Vitals and nursing note reviewed.  HENT:     Head: Normocephalic and atraumatic.     Mouth/Throat:     Pharynx: Oropharynx is clear.  Eyes:     Extraocular Movements: Extraocular movements intact.     Pupils: Pupils are equal, round, and reactive to light.  Cardiovascular:      Rate and Rhythm: Normal rate and regular rhythm.  Pulmonary:     Comments: Decreased breath sounds bilaterally.  Abdominal:     Palpations: Abdomen is soft.  Musculoskeletal:        General: Normal range of motion.     Cervical back: Normal range of motion.  Skin:    General: Skin is warm.  Neurological:     General: No focal deficit present.     Mental Status: She is alert and oriented to person, place, and time.  Psychiatric:        Behavior: Behavior normal.        Judgment: Judgment normal.      LABORATORY DATA:  I have reviewed the data as listed Lab Results  Component Value Date   WBC 4.8 11/13/2022   HGB 12.3 11/13/2022   HCT 36.3 11/13/2022   MCV 89.6 11/13/2022   PLT 166 11/13/2022   Recent Labs    04/07/22 1419 04/14/22 1459 05/15/22 1532 06/09/22 1432 10/06/22 1631 11/13/22 1411  NA 134*  --  138 136 142 140  K 3.1*   < > 4.2 3.5 3.3* 3.4*  CL 103  --  99  100 101 105  CO2 27  --  '24 29 27 29  '$ GLUCOSE 103*  --  93 118* 87 94  BUN 11  --  '13 11 8 8  '$ CREATININE 0.66  --  1.13* 0.83 0.83 0.76  CALCIUM 8.7*  --  9.3 8.7* 9.6 8.8*  GFRNONAA >60  --   --  >60  --  >60  PROT 7.3  --  6.8  --  6.9  --   ALBUMIN 3.7  --  4.3  --  4.3  --   AST 15  --  15  --  19  --   ALT 11  --  9  --  14  --   ALKPHOS 39  --  52  --  46  --   BILITOT 0.3  --  0.5  --  0.8  --    < > = values in this interval not displayed.     No results found.  ASSESSMENT & PLAN:   Iron deficiency #Iron deficient anemia [hx TAH]: 9.8; ferritin 6/iron saturation 6% [April 2023]-poor tolerance to oral iron.  S/p IV iron infusion   #June 2023 hemoglobin is 12.3.  As patient symptomatic proceed with Venofer.   #Etiology of iron deficiency: April 2023 colonoscopy negative for any acute chronic blood loss.  Recommend further work-up including EGD /possible capsule study. CT stone protocol-April 2023 negative for any acute process.  # Mild intermittent thrombocytopenia- 130-140s-  STABLE.   # History of kidney stones/chronic mild hydronephrosis right side-s/p staged lithotripsy x 2  [ May & June 2023-Dr. Stoioff]-improved.  # DISPOSITION: # venofer today # venofer again in 1-2 weeks # follow up in 4 months- labs- cbc/bmp; iron studies; ferritin; possible venoferDr.B  All questions were answered. The patient knows to call the clinic with any problems, questions or concerns.    Cammie Sickle, MD 11/13/2022 3:58 PM

## 2022-11-19 ENCOUNTER — Ambulatory Visit: Payer: Self-pay | Admitting: *Deleted

## 2022-11-19 DIAGNOSIS — J029 Acute pharyngitis, unspecified: Secondary | ICD-10-CM | POA: Diagnosis not present

## 2022-11-19 DIAGNOSIS — Z03818 Encounter for observation for suspected exposure to other biological agents ruled out: Secondary | ICD-10-CM | POA: Diagnosis not present

## 2022-11-19 DIAGNOSIS — R1312 Dysphagia, oropharyngeal phase: Secondary | ICD-10-CM | POA: Diagnosis not present

## 2022-11-19 DIAGNOSIS — J020 Streptococcal pharyngitis: Secondary | ICD-10-CM | POA: Diagnosis not present

## 2022-11-19 NOTE — Telephone Encounter (Signed)
  Chief Complaint: Sore throat, body aches Symptoms: Sore throat "Feels like closing up" body aches, headache, fever 104.0  "Down to 102.0 with IBU".Severe body aches. Not drinking, urine dark with strong odor, dizzy. Tested neg for covid. Body aches, headache 9/10. Frequency: Tuesday Pertinent Negatives: Patient denies cough Disposition: '[x]'$ ED /'[]'$ Urgent Care (no appt availability in office) / '[]'$ Appointment(In office/virtual)/ '[]'$  [facility]/ '[]'$ Home Care/ '[]'$ Refused Recommended Disposition /'[]'$ [facility]/ '[]'$  Follow-up with PCP Additional Notes:  Advised ED. Pt states will follow disposition.  Reason for Disposition  [1] Drinking very little AND [2] dehydration suspected (e.g., no urine > 12 hours, very dry mouth, very lightheaded)  Answer Assessment - Initial Assessment Questions 1. ONSET: "When did the throat start hurting?" (Hours or days ago)      Tuesday 2. SEVERITY: "How bad is the sore throat?" (Scale 1-10; mild, moderate or severe)   - MILD (1-3):  Doesn't interfere with eating or normal activities.   - MODERATE (4-7): Interferes with eating some solids and normal activities.   - SEVERE (8-10):  Excruciating pain, interferes with most normal activities.   - SEVERE WITH DYSPHAGIA (10): Can't swallow liquids, drooling.     9/10 4.  VIRAL SYMPTOMS: "Are there any symptoms of a cold, such as a runny nose, cough, hoarse voice or red eyes?"      Sinus pain 5. FEVER: "Do you have a fever?" If Yes, ask: "What is your temperature, how was it measured, and when did it start?"     102-104 6. PUS ON THE TONSILS: "Is there pus on the tonsils in the back of your throat?"     unsure 7. OTHER SYMPTOMS: "Do you have any other symptoms?" (e.g., difficulty breathing, headache, rash)     Headache,body aches, urine darker, strong odor.  Protocols used: Sore Throat-A-AH

## 2023-01-06 ENCOUNTER — Ambulatory Visit: Payer: BC Managed Care – PPO | Admitting: Nurse Practitioner

## 2023-01-06 ENCOUNTER — Encounter: Payer: Self-pay | Admitting: Nurse Practitioner

## 2023-01-06 VITALS — BP 127/89 | HR 94 | Temp 98.7°F | Ht 68.27 in | Wt 162.2 lb

## 2023-01-06 DIAGNOSIS — E039 Hypothyroidism, unspecified: Secondary | ICD-10-CM | POA: Diagnosis not present

## 2023-01-06 DIAGNOSIS — Z136 Encounter for screening for cardiovascular disorders: Secondary | ICD-10-CM | POA: Diagnosis not present

## 2023-01-06 DIAGNOSIS — E876 Hypokalemia: Secondary | ICD-10-CM

## 2023-01-06 DIAGNOSIS — Z1231 Encounter for screening mammogram for malignant neoplasm of breast: Secondary | ICD-10-CM

## 2023-01-06 DIAGNOSIS — Z0000 Encounter for general adult medical examination without abnormal findings: Secondary | ICD-10-CM | POA: Diagnosis not present

## 2023-01-06 DIAGNOSIS — E611 Iron deficiency: Secondary | ICD-10-CM

## 2023-01-06 LAB — URINALYSIS, ROUTINE W REFLEX MICROSCOPIC
Bilirubin, UA: NEGATIVE
Glucose, UA: NEGATIVE
Ketones, UA: NEGATIVE
Leukocytes,UA: NEGATIVE
Nitrite, UA: NEGATIVE
Specific Gravity, UA: 1.025 (ref 1.005–1.030)
Urobilinogen, Ur: 0.2 mg/dL (ref 0.2–1.0)
pH, UA: 6 (ref 5.0–7.5)

## 2023-01-06 LAB — MICROSCOPIC EXAMINATION: Bacteria, UA: NONE SEEN

## 2023-01-06 MED ORDER — LEVOTHYROXINE SODIUM 50 MCG PO TABS: 50.0000 ug | ORAL_TABLET | Freq: Every day | ORAL | 1 refills | Status: DC

## 2023-01-06 NOTE — Patient Instructions (Signed)
Please call to schedule your mammogram and/or bone density: [facility]  Address: [address], [place], [place] [address] Phone: [phone]  [facility] [place] [address] [place],  [place]  [address] Phone: [phone]   

## 2023-01-06 NOTE — Assessment & Plan Note (Signed)
Chronic.  Controlled.  Continue with current medication regimen of Levothyroxine 39mg.  Labs ordered today.  Will make recommendations based on lab results. Return to clinic in 5 months for reevaluation.  Call sooner if concerns arise.

## 2023-01-06 NOTE — Assessment & Plan Note (Signed)
Chronic.  Controlled.  Continue with current medication regimen.  Labs ordered today.  Return to clinic in 6 months for reevaluation.  Call sooner if concerns arise.  ? ?

## 2023-01-06 NOTE — Progress Notes (Signed)
BP 127/89   Pulse 94   Temp 98.7 F (37.1 C) (Oral)   Ht 5' 8.27" (1.734 m)   Wt 162 lb 3.2 oz (73.6 kg)   LMP 09/22/2021 (Approximate)   SpO2 100%   BMI 24.47 kg/m    Subjective:    Patient ID: Kari Acosta, female    DOB: [birth year], 47 y.o.   MRN: [id number]  HPI: Kari Acosta is a 47 y.o. female presenting on 01/06/2023 for comprehensive medical examination. Current medical complaints include:none  She currently lives with: Menopausal Symptoms: no  HYPOTHYROIDISM Thyroid control status:controlled Satisfied with current treatment? yes Medication side effects: no Medication compliance: excellent compliance Etiology of hypothyroidism:  Recent dose adjustment:no Fatigue: yes Cold intolerance: yes Heat intolerance: no Weight gain: no Weight loss: no Constipation: yes Diarrhea/loose stools: no Palpitations: no Lower extremity edema: no Anxiety/depressed mood: no   Depression Screen done today and results listed below:     01/06/2023    2:26 PM 10/06/2022    4:43 PM 03/13/2022    3:30 PM 10/21/2021   11:09 AM 06/19/2021    8:35 AM  Depression screen PHQ 2/9  Decreased Interest 0 1 0 0 0  Down, Depressed, Hopeless 0 0 0 0 0  PHQ - 2 Score 0 1 0 0 0  Altered sleeping 1 1 0 0   Tired, decreased energy '1 1 2 1   '$ Change in appetite 0 0 0 0   Feeling bad or failure about yourself  0 0 0 0   Trouble concentrating 1 0 0 0   Moving slowly or fidgety/restless 0 0 0 0   Suicidal thoughts 0 0 0 0   PHQ-9 Score '3 3 2 1   '$ Difficult doing work/chores Not difficult at all Not difficult at all Not difficult at all Not difficult at all     The patient does not have a history of falls. I did complete a risk assessment for falls. A plan of care for falls was documented.   Past Medical History:  Past Medical History:  Diagnosis Date   Anemia    Complication of anesthesia    difficult to wake up after hysterectomy   Family history of colon cancer in mother     Family history of ovarian cancer 08/2021   cancer genetic testing letter sent   History of kidney stones    Hypothyroidism    Right nephrolithiasis 04/2022    Surgical History:  Past Surgical History:  Procedure Laterality Date   CESAREAN SECTION     CHOLECYSTECTOMY     COLONOSCOPY WITH PROPOFOL N/A 02/26/2022   Procedure: COLONOSCOPY WITH PROPOFOL;  Surgeon: Lucilla Lame, MD;  Location: [facility];  Service: Endoscopy;  Laterality: N/A;   CYSTOSCOPY  09/23/2021   Procedure: CYSTOSCOPY;  Surgeon: Gae Dry, MD;  Location: [facility];  Service: Gynecology;;   CYSTOSCOPY/URETEROSCOPY/HOLMIUM LASER/STENT PLACEMENT Right 05/05/2022   Procedure: CYSTOSCOPY/URETEROSCOPY/HOLMIUM LASER/STENT EXCHANGE;  Surgeon: Abbie Sons, MD;  Location: [facility];  Service: Urology;  Laterality: Right;   CYSTOSCOPY/URETEROSCOPY/HOLMIUM LASER/STENT PLACEMENT Right 04/21/2022   Procedure: CYSTOSCOPY/URETEROSCOPY/HOLMIUM LASER/STENT PLACEMENT;  Surgeon: Abbie Sons, MD;  Location: [facility];  Service: Urology;  Laterality: Right;   LAPAROSCOPIC BILATERAL SALPINGECTOMY Bilateral 09/23/2021   Procedure: LAPAROSCOPIC BILATERAL SALPINGECTOMY;  Surgeon: Gae Dry, MD;  Location: [facility];  Service: Gynecology;  Laterality: Bilateral;   LAPAROSCOPIC SUPRACERVICAL HYSTERECTOMY  09/23/2021   Procedure: LAPAROSCOPIC SUPRACERVICAL HYSTERECTOMY WITH ABLATION OF ENDOMETRIAL CELLS;  Surgeon: Gae Dry, MD;  Location: [facility];  Service: Gynecology;;    Medications:  Current Outpatient Medications on File Prior to Visit  Medication Sig   levothyroxine (SYNTHROID) 50 MCG tablet Take 1 tablet (50 mcg total) by mouth daily.   [DISCONTINUED] prochlorperazine (COMPAZINE) 10 MG tablet Take 1 tablet (10 mg total) by mouth every 8 (eight) hours as needed (headache).   No current facility-administered medications on file prior to visit.    Allergies:  No Known Allergies  Social History:  Social  History   Socioeconomic History   Marital status: Significant Other    Spouse name: Not on file   Number of children: 1   Years of education: Not on file   Highest education level: Not on file  Occupational History   Not on file  Tobacco Use   Smoking status: Never   Smokeless tobacco: Never  Vaping Use   Vaping Use: Never used  Substance and Sexual Activity   Alcohol use: No   Drug use: Never   Sexual activity: Yes  Other Topics Concern   Not on file  Social History Narrative   Lives in [place] with boyfriend; and children; no smoking; no alcohol; 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     Social Determinants of Health   Financial Resource Strain: Not on file  Food Insecurity: Not on file  Transportation Needs: Not on file  Physical Activity: Not on file  Stress: Not on file  Social Connections: Not on file  Intimate Partner Violence: Not on file   Social History   Tobacco Use  Smoking Status Never  Smokeless Tobacco Never   Social History   Substance and Sexual Activity  Alcohol Use No    Family History:  Family History  Problem Relation Age of Onset   Colon cancer Mother 58   Hypertension Mother    Hyperlipidemia Father    Hypertension Father    Stroke Father    Healthy Son    Healthy Son    Ovarian cancer Maternal Aunt    Breast cancer Maternal Grandmother    Cancer Maternal Grandmother        Gallbladder Cancer   Lung cancer Maternal Grandmother    Hypertension Maternal Grandfather    Heart failure Paternal Grandmother    Tuberculosis Paternal Grandfather    Cancer - Other Maternal Great-grandmother        Gallbladder Cancer    Past medical history, surgical history, medications, allergies, family history and social history reviewed with patient today and changes made to appropriate areas of the chart.   Review of Systems  Constitutional:  Positive for malaise/fatigue. Negative for weight loss.  Cardiovascular:  Negative for palpitations and  leg swelling.  Gastrointestinal:  Positive for constipation. Negative for diarrhea.  Psychiatric/Behavioral:  Negative for depression. The patient is not nervous/anxious.    All other ROS negative except what is listed above and in the HPI.      Objective:    BP 127/89   Pulse 94   Temp 98.7 F (37.1 C) (Oral)   Ht 5' 8.27" (1.734 m)   Wt 162 lb 3.2 oz (73.6 kg)   LMP 09/22/2021 (Approximate)   SpO2 100%   BMI 24.47 kg/m   Wt Readings from Last 3 Encounters:  01/06/23 162 lb 3.2 oz (73.6 kg)  11/13/22 164 lb 14.4 oz (74.8 kg)  10/06/22 162 lb 8 oz (73.7 kg)    Physical Exam Vitals and nursing note reviewed.  Constitutional:      General: She is awake. She is not in acute distress.    Appearance: Normal appearance. She is well-developed and normal weight. She is not ill-appearing.  HENT:     Head: Normocephalic and atraumatic.     Right Ear: Hearing, tympanic membrane, ear canal and external ear normal. No drainage.     Left Ear: Hearing, tympanic membrane, ear canal and external ear normal. No drainage.     Nose: Nose normal.     Right Sinus: No maxillary sinus tenderness or frontal sinus tenderness.     Left Sinus: No maxillary sinus tenderness or frontal sinus tenderness.     Mouth/Throat:     Mouth: Mucous membranes are moist.     Pharynx: Oropharynx is clear. Uvula midline. No pharyngeal swelling, oropharyngeal exudate or posterior oropharyngeal erythema.  Eyes:     General: Lids are normal.        Right eye: No discharge.        Left eye: No discharge.     Extraocular Movements: Extraocular movements intact.     Conjunctiva/sclera: Conjunctivae normal.     Pupils: Pupils are equal, round, and reactive to light.     Visual Fields: Right eye visual fields normal and left eye visual fields normal.  Neck:     Thyroid: No thyromegaly.     Vascular: No carotid bruit.     Trachea: Trachea normal.  Cardiovascular:     Rate and Rhythm: Normal rate and regular rhythm.      Heart sounds: Normal heart sounds. No murmur heard.    No gallop.  Pulmonary:     Effort: Pulmonary effort is normal. No accessory muscle usage or respiratory distress.     Breath sounds: Normal breath sounds.  Chest:  Breasts:    Right: Normal.     Left: Normal.  Abdominal:     General: Bowel sounds are normal.     Palpations: Abdomen is soft. There is no hepatomegaly or splenomegaly.     Tenderness: There is no abdominal tenderness.  Musculoskeletal:        General: Normal range of motion.     Cervical back: Normal range of motion and neck supple.     Right lower leg: No edema.     Left lower leg: No edema.  Lymphadenopathy:     Head:     Right side of head: No submental, submandibular, tonsillar, preauricular or posterior auricular adenopathy.     Left side of head: No submental, submandibular, tonsillar, preauricular or posterior auricular adenopathy.     Cervical: No cervical adenopathy.     Upper Body:     Right upper body: No supraclavicular, axillary or pectoral adenopathy.     Left upper body: No supraclavicular, axillary or pectoral adenopathy.  Skin:    General: Skin is warm and dry.     Capillary Refill: Capillary refill takes less than 2 seconds.     Findings: No rash.  Neurological:     Mental Status: She is alert and oriented to person, place, and time.     Gait: Gait is intact.  Psychiatric:        Attention and Perception: Attention normal.        Mood and Affect: Mood normal.        Speech: Speech normal.        Behavior: Behavior normal. Behavior is cooperative.        Thought Content: Thought content normal.  Judgment: Judgment normal.     Results for orders placed or performed in visit on 11/13/22  Ferritin  Result Value Ref Range   Ferritin 31 11 - 307 ng/mL  Iron and TIBC  Result Value Ref Range   Iron 68 28 - 170 ug/dL   TIBC 279 250 - 450 ug/dL   Saturation Ratios 24 10.4 - 31.8 %   UIBC 211 ug/dL  Basic metabolic panel  Result  Value Ref Range   Sodium 140 135 - 145 mmol/L   Potassium 3.4 (L) 3.5 - 5.1 mmol/L   Chloride 105 98 - 111 mmol/L   CO2 29 22 - 32 mmol/L   Glucose, Bld 94 70 - 99 mg/dL   BUN 8 6 - 20 mg/dL   Creatinine, Ser 0.76 0.44 - 1.00 mg/dL   Calcium 8.8 (L) 8.9 - 10.3 mg/dL   GFR, Estimated >60 >60 mL/min   Anion gap 6 5 - 15  CBC with Differential/Platelet  Result Value Ref Range   WBC 4.8 4.0 - 10.5 K/uL   RBC 4.05 3.87 - 5.11 MIL/uL   Hemoglobin 12.3 12.0 - 15.0 g/dL   HCT 36.3 36.0 - 46.0 %   MCV 89.6 80.0 - 100.0 fL   MCH 30.4 26.0 - 34.0 pg   MCHC 33.9 30.0 - 36.0 g/dL   RDW 12.6 11.5 - 15.5 %   Platelets 166 150 - 400 K/uL   nRBC 0.0 0.0 - 0.2 %   Neutrophils Relative % 54 %   Neutro Abs 2.6 1.7 - 7.7 K/uL   Lymphocytes Relative 32 %   Lymphs Abs 1.5 0.7 - 4.0 K/uL   Monocytes Relative 11 %   Monocytes Absolute 0.5 0.1 - 1.0 K/uL   Eosinophils Relative 2 %   Eosinophils Absolute 0.1 0.0 - 0.5 K/uL   Basophils Relative 1 %   Basophils Absolute 0.0 0.0 - 0.1 K/uL   Immature Granulocytes 0 %   Abs Immature Granulocytes 0.02 0.00 - 0.07 K/uL      Assessment & Plan:   Problem List Items Addressed This Visit       Endocrine   Hypothyroidism    Chronic.  Controlled.  Continue with current medication regimen of Levothyroxine 90mg.  Labs ordered today.  Will make recommendations based on lab results. Return to clinic in 5 months for reevaluation.  Call sooner if concerns arise.        Relevant Orders   TSH   T4, free     Other   Iron deficiency    Chronic.  Controlled.  Continue with current medication regimen.  Labs ordered today.  Return to clinic in 6 months for reevaluation.  Call sooner if concerns arise.        Other Visit Diagnoses     Annual physical exam    -  Primary   Health maintenance reviewed during visit today.  Labs ordered.  TDAP up to date.  Declined flu vaccine.  Mammogram ordered today.   Relevant Orders   CBC with Differential/Platelet    Comprehensive metabolic panel   Lipid panel   TSH   Urinalysis, Routine w reflex microscopic   T4, free   Screening for ischemic heart disease       Relevant Orders   Lipid panel   Encounter for screening mammogram for malignant neoplasm of breast       Relevant Orders   MM 3D SCREEN BREAST BILATERAL  Follow up plan: Return in about 5 months (around 06/07/2023) for HTN, HLD, DM2 FU.   LABORATORY TESTING:  - Pap smear: up to date  IMMUNIZATIONS:   - Tdap: Tetanus vaccination status reviewed: last tetanus booster within 10 years. - Influenza: Refused - Pneumovax: Not applicable - Prevnar: Not applicable - COVID: Not applicable - HPV: Not applicable - Shingrix vaccine: Not applicable  SCREENING: -Mammogram: Ordered today  - Colonoscopy: Not applicable  - Bone Density: Not applicable  -Hearing Test: Not applicable  -Spirometry: Not applicable   PATIENT COUNSELING:   Advised to take 1 mg of folate supplement per day if capable of pregnancy.   Sexuality: Discussed sexually transmitted diseases, partner selection, use of condoms, avoidance of unintended pregnancy  and contraceptive alternatives.   Advised to avoid cigarette smoking.  I discussed with the patient that most people either abstain from alcohol or drink within safe limits (<=14/week and <=4 drinks/occasion for males, <=7/weeks and <= 3 drinks/occasion for females) and that the risk for alcohol disorders and other health effects rises proportionally with the number of drinks per week and how often a drinker exceeds daily limits.  Discussed cessation/primary prevention of drug use and availability of treatment for abuse.   Diet: Encouraged to adjust caloric intake to maintain  or achieve ideal body weight, to reduce intake of dietary saturated fat and total fat, to limit sodium intake by avoiding high sodium foods and not adding table salt, and to maintain adequate dietary potassium and calcium preferably from  fresh fruits, vegetables, and low-fat dairy products.    stressed the importance of regular exercise  Injury prevention: Discussed safety belts, safety helmets, smoke detector, smoking near bedding or upholstery.   Dental health: Discussed importance of regular tooth brushing, flossing, and dental visits.    NEXT PREVENTATIVE PHYSICAL DUE IN 1 YEAR. Return in about 5 months (around 06/07/2023) for HTN, HLD, DM2 FU.

## 2023-01-07 LAB — LIPID PANEL
Chol/HDL Ratio: 2.2 ratio (ref 0.0–4.4)
Cholesterol, Total: 152 mg/dL (ref 100–199)
HDL: 68 mg/dL (ref >39)
LDL Chol Calc (NIH): 74 mg/dL (ref 0–99)
Triglycerides: 45 mg/dL (ref 0–149)
VLDL Cholesterol Cal: 10 mg/dL (ref 5–40)

## 2023-01-07 LAB — CBC WITH DIFFERENTIAL/PLATELET
Basophils Absolute: 0.1 10*3/uL (ref 0.0–0.2)
Basos: 1 %
EOS (ABSOLUTE): 0.1 10*3/uL (ref 0.0–0.4)
Eos: 2 %
Hematocrit: 38.5 % (ref 34.0–46.6)
Hemoglobin: 12.9 g/dL (ref 11.1–15.9)
Immature Grans (Abs): 0 10*3/uL (ref 0.0–0.1)
Immature Granulocytes: 0 %
Lymphocytes Absolute: 1.8 10*3/uL (ref 0.7–3.1)
Lymphs: 37 %
MCH: 29.6 pg (ref 26.6–33.0)
MCHC: 33.5 g/dL (ref 31.5–35.7)
MCV: 88 fL (ref 79–97)
Monocytes Absolute: 0.4 10*3/uL (ref 0.1–0.9)
Monocytes: 8 %
Neutrophils Absolute: 2.5 10*3/uL (ref 1.4–7.0)
Neutrophils: 52 %
Platelets: 161 10*3/uL (ref 150–450)
RBC: 4.36 x10E6/uL (ref 3.77–5.28)
RDW: 12 % (ref 11.7–15.4)
WBC: 4.9 10*3/uL (ref 3.4–10.8)

## 2023-01-07 LAB — COMPREHENSIVE METABOLIC PANEL
ALT: 9 IU/L (ref 0–32)
AST: 19 IU/L (ref 0–40)
Albumin/Globulin Ratio: 1.5 (ref 1.2–2.2)
Albumin: 4 g/dL (ref 3.9–4.9)
Alkaline Phosphatase: 47 IU/L (ref 44–121)
BUN/Creatinine Ratio: 8 — ABNORMAL LOW (ref 9–23)
BUN: 7 mg/dL (ref 6–24)
Bilirubin Total: 0.4 mg/dL (ref 0.0–1.2)
CO2: 24 mmol/L (ref 20–29)
Calcium: 8.6 mg/dL — ABNORMAL LOW (ref 8.7–10.2)
Chloride: 102 mmol/L (ref 96–106)
Creatinine, Ser: 0.86 mg/dL (ref 0.57–1.00)
Globulin, Total: 2.7 g/dL (ref 1.5–4.5)
Glucose: 88 mg/dL (ref 70–99)
Potassium: 3.1 mmol/L — ABNORMAL LOW (ref 3.5–5.2)
Sodium: 142 mmol/L (ref 134–144)
Total Protein: 6.7 g/dL (ref 6.0–8.5)
eGFR: 85 mL/min/{1.73_m2} (ref >59)

## 2023-01-07 LAB — T4, FREE: Free T4: 1.24 ng/dL (ref 0.82–1.77)

## 2023-01-07 LAB — TSH: TSH: 5.13 u[IU]/mL — ABNORMAL HIGH (ref 0.450–4.500)

## 2023-01-07 MED ORDER — LEVOTHYROXINE SODIUM 75 MCG PO TABS: 75.0000 ug | ORAL_TABLET | Freq: Every day | ORAL | 1 refills | Status: DC

## 2023-01-07 NOTE — Progress Notes (Signed)
Hi Mechille. It was nice to see you yesterday.  Your lab work looks good.  Your TSH is high.  Let's increase your dose of levothyroxine dose to 30mg daily since you are still having fatigue.  We will repeat your labs in 6 weeks.  Your potassium is also low.  I recommend increasing your potassium intake with potassium rich foods such as bananas and potatoes.  I would like you to come back and recheck this next week.  No other concerns at this time. Continue with your current medication regimen.  Follow up as discussed.  Please let me know if you have any questions.   Please make patient two lab appointments.

## 2023-01-07 NOTE — Addendum Note (Signed)
Addended by: Jon Billings on: 01/07/2023 07:51 AM   Modules accepted: Orders

## 2023-01-13 ENCOUNTER — Other Ambulatory Visit: Payer: BC Managed Care – PPO

## 2023-01-13 DIAGNOSIS — E876 Hypokalemia: Secondary | ICD-10-CM | POA: Diagnosis not present

## 2023-01-14 LAB — COMPREHENSIVE METABOLIC PANEL
ALT: 10 IU/L (ref 0–32)
AST: 19 IU/L (ref 0–40)
Albumin/Globulin Ratio: 1.7 (ref 1.2–2.2)
Albumin: 4.3 g/dL (ref 3.9–4.9)
Alkaline Phosphatase: 47 IU/L (ref 44–121)
BUN/Creatinine Ratio: 14 (ref 9–23)
BUN: 10 mg/dL (ref 6–24)
Bilirubin Total: 0.5 mg/dL (ref 0.0–1.2)
CO2: 28 mmol/L (ref 20–29)
Calcium: 9.4 mg/dL (ref 8.7–10.2)
Chloride: 101 mmol/L (ref 96–106)
Creatinine, Ser: 0.73 mg/dL (ref 0.57–1.00)
Globulin, Total: 2.5 g/dL (ref 1.5–4.5)
Glucose: 85 mg/dL (ref 70–99)
Potassium: 3.6 mmol/L (ref 3.5–5.2)
Sodium: 141 mmol/L (ref 134–144)
Total Protein: 6.8 g/dL (ref 6.0–8.5)
eGFR: 104 mL/min/{1.73_m2} (ref >59)

## 2023-01-14 NOTE — Progress Notes (Signed)
Hi Ferne.  Your labs returned to normal which is great.  No need for treatment at this time.

## 2023-02-11 ENCOUNTER — Telehealth (INDEPENDENT_AMBULATORY_CARE_PROVIDER_SITE_OTHER): Payer: BC Managed Care – PPO | Admitting: Physician Assistant

## 2023-02-11 ENCOUNTER — Ambulatory Visit: Payer: Self-pay

## 2023-02-11 ENCOUNTER — Encounter: Payer: Self-pay | Admitting: Physician Assistant

## 2023-02-11 DIAGNOSIS — J0100 Acute maxillary sinusitis, unspecified: Secondary | ICD-10-CM

## 2023-02-11 MED ORDER — AMOXICILLIN-POT CLAVULANATE 875-125 MG PO TABS: 1.0000 | ORAL_TABLET | Freq: Two times a day (BID) | ORAL | 0 refills | Status: AC

## 2023-02-11 NOTE — Telephone Encounter (Signed)
Message from [employer] sent at 02/11/2023  8:23 AM EST  Summary: sinus discomfort / rx req   The patient has experienced sinus discomfort since 02/04/23  The patient shares that they are experiencing congestion, cough and headache  The patient would like to be prescribed something for their discomfort  Please contact the patient further when possible         Chief Complaint: sinus congestion and facial pain Symptoms: green nasal secretions, cough, headache Frequency: 7 days Pertinent Negatives: Patient denies fever Disposition: '[]'$ ED /'[]'$ Urgent Care (no appt availability in office) / '[x]'$ Appointment(In office/virtual)/ '[]'$  [facility]/ '[]'$ Home Care/ '[]'$ Refused Recommended Disposition /'[]'$ [facility]/ '[]'$  Follow-up with PCP Additional Notes: Virtual Office visit.   Reason for Disposition  [1] Sinus congestion (pressure, fullness) AND [2] present > 10 days    7 days only  Answer Assessment - Initial Assessment Questions 1. LOCATION: "Where does it hurt?"      Between eye and nose  2. ONSET: "When did the sinus pain start?"  (e.g., hours, days)      Last Thursday  3. SEVERITY: "How bad is the pain?"   (Scale 1-10; mild, moderate or severe)   - MILD (1-3): doesn't interfere with normal activities    - MODERATE (4-7): interferes with normal activities (e.g., work or school) or awakens from sleep   - SEVERE (8-10): excruciating pain and patient unable to do any normal activities        moderate 4. RECURRENT SYMPTOM: "Have you ever had sinus problems before?" If Yes, ask: "When was the last time?" and "What happened that time?"      Yes all the time usually go to walk in clinic abx Flonase 5. NASAL CONGESTION: "Is the nose blocked?" If Yes, ask: "Can you open it or must you breathe through your mouth?"     Comes and goes 6. NASAL DISCHARGE: "Do you have discharge from your nose?" If so ask, "What color?"     Yes- clear to green 7. FEVER: "Do you have a fever?"  If Yes, ask: "What is it, how was it measured, and when did it start?"      no 8. OTHER SYMPTOMS: "Do you have any other symptoms?" (e.g., sore throat, cough, earache, difficulty breathing)     Sore throat 9. PREGNANCY: "Is there any chance you are pregnant?" "When was your last menstrual period?"     N/a  Protocols used: Sinus Pain or Congestion-A-AH

## 2023-02-11 NOTE — Progress Notes (Signed)
Virtual Visit via Video Note  I connected with Kari Acosta on 02/11/23 at  1:40 PM EST by a video enabled telemedicine application and verified that I am speaking with the correct person using two identifiers.  Today's Provider: Talitha Givens, MHS, PA-C Introduced myself to the patient as a PA-C and provided education on APPs in clinical practice.   Location: Patient: At home  Provider: [place], [place]    I discussed the limitations of evaluation and management by telemedicine and the availability of in person appointments. The patient expressed understanding and agreed to proceed.   Chief Complaint  Patient presents with   Headache    Congestion, nasal congestion, started last week. OTC Mucinex, sudafed with no help with symptoms.     History of Present Illness:    URI- Type symptoms   Onset: sudden  Duration: since Thurs   Associated Symptoms: headaches, sinus congestion rhinorrhea, frontal sinus pressure, postnasal drainage Reports she becomes more congested in warm areas   She reports her symptoms have not improved at all since she first started to have them   Interventions: Mucinex daytime and nighttime, sudafed   Recent sick contacts: none to her knowledge   COVID testing at home: she has not tested for COVID at home    Review of Systems  Constitutional:  Negative for chills, fever and malaise/fatigue.  HENT:  Positive for congestion, sinus pain and sore throat. Negative for ear pain.   Respiratory:  Negative for cough, sputum production, shortness of breath and wheezing.   Gastrointestinal:  Negative for diarrhea, nausea and vomiting.  Musculoskeletal:  Negative for myalgias.  Neurological:  Positive for headaches.    Observations/Objective:  Due to the nature of the virtual visit, physical exam and observations are limited. Able to obtain the following observations:   Alert, oriented, Appears comfortable, in no acute  distress.  No scleral injection, no appreciated hoarseness, tachypnea, wheeze or strider. Able to maintain conversation without visible strain.  No cough appreciated during visit.   Assessment and Plan:  Problem List Items Addressed This Visit   None Visit Diagnoses     Acute maxillary sinusitis, recurrence not specified    -  Primary Acute, new concern Reports sinus pressure, congestion, postnasal drainage, and headaches for a week despite home measures Symptoms appear consistent with rhinosinusitis - likely due to bacterial infection Will send in script for Augmentin 875-125 mg PO BID x 7 days Recommend she continue with OTC medications such as Mucinex and saline nasal flushes to assist with symptoms Follow up as needed for persistent or progressing symptoms    Relevant Medications   amoxicillin-clavulanate (AUGMENTIN) 875-125 MG tablet        Follow Up Instructions:    I discussed the assessment and treatment plan with the patient. The patient was provided an opportunity to ask questions and all were answered. The patient agreed with the plan and demonstrated an understanding of the instructions.   The patient was advised to call back or seek an in-person evaluation if the symptoms worsen or if the condition fails to improve as anticipated.  I provided 8 minutes of non-face-to-face time during this encounter.  No follow-ups on file.   I, Harlyn Rathmann E Yusef Lamp, PA-C, have reviewed all documentation for this visit. The documentation on 02/11/23 for the exam, diagnosis, procedures, and orders are all accurate and complete.   Talitha Givens, MHS, PA-C [facility]

## 2023-02-11 NOTE — Patient Instructions (Addendum)
Based on your symptoms and duration of illness, I believe you may have a bacterial sinus infection  These typically resolve with antibiotic therapy along with at-home comfort measures  Today I have sent in a prescription for Augmentin 2000-125 mg to be taken by mouth twice per day for 7 days FINISH THE ENTIRE COURSE unless you develop an allergic reaction or are instructed to discontinue.  It can take a few days for the antibiotic to kick in so I recommend symptomatic relief with over the counter medication such as the following: Dayquil/ Nyquil Theraflu Alkaseltzer  Coricidin - if you have high blood pressure even if it is well managed with medications  You can also use nasal saline rinses and flushes to help with relief and a humidifier at night   These medications typically have Tylenol in them already so you can take Ibuprofen as needed for further pain/ discomfort and fever management/ do not need to supplement with more outside of those medications  Stay well hydrated with at least 75 oz of water per day to help with recovery  If you notice any of the following please let us know: increased fever not responding to Tylenol or Ibuprofen, swelling around your nose or eyes, difficulty seeing,

## 2023-02-17 ENCOUNTER — Other Ambulatory Visit: Payer: BC Managed Care – PPO

## 2023-02-17 DIAGNOSIS — E039 Hypothyroidism, unspecified: Secondary | ICD-10-CM | POA: Diagnosis not present

## 2023-02-18 LAB — T4, FREE: Free T4: 1.3 ng/dL (ref 0.82–1.77)

## 2023-02-18 LAB — TSH: TSH: 2.85 u[IU]/mL (ref 0.450–4.500)

## 2023-02-18 NOTE — Progress Notes (Signed)
Hi Kari Acosta. Your lab work shows that your thyroid is back within normal range.  Continue with your current medication regimen.  Follow up as discussed.

## 2023-02-24 ENCOUNTER — Encounter: Payer: Self-pay | Admitting: Nurse Practitioner

## 2023-03-12 MED FILL — Iron Sucrose Inj 20 MG/ML (Fe Equiv): INTRAVENOUS | Qty: 10 | Status: AC

## 2023-03-15 ENCOUNTER — Inpatient Hospital Stay: Payer: BC Managed Care – PPO | Attending: Internal Medicine

## 2023-03-15 ENCOUNTER — Inpatient Hospital Stay: Payer: BC Managed Care – PPO

## 2023-03-15 ENCOUNTER — Inpatient Hospital Stay: Payer: BC Managed Care – PPO | Admitting: Internal Medicine

## 2023-03-15 ENCOUNTER — Encounter: Payer: Self-pay | Admitting: Internal Medicine

## 2023-03-15 NOTE — Assessment & Plan Note (Deleted)
#  Iron deficient anemia [hx TAH]: 9.8; ferritin 6/iron saturation 6% [April 2023]-poor tolerance to oral iron.  S/p IV iron infusion   #June 2023 hemoglobin is 12.3.  As patient symptomatic proceed with Venofer.   #Etiology of iron deficiency: April 2023 colonoscopy negative for any acute chronic blood loss.  Recommend further work-up including EGD /possible capsule study. Discussed with pt to conact Dr.Wohl's office. CT stone protocol-April 2023 negative for any acute process.  # Mild intermittent thrombocytopenia- 130-140s- STABLE.   # History of kidney stones/chronic mild hydronephrosis right side-s/p staged lithotripsy x 2  [ May & June 2023-Dr. Stoioff]-improved.  # DISPOSITION: # venofer today # follow up in 4 months- labs- cbc/bmp; iron studies; ferritin; possible venofer-Dr.B 

## 2023-03-15 NOTE — Progress Notes (Deleted)
[name], NP as PCP - General (Nurse Practitioner) Cammie Sickle, MD as Consulting Physician (Oncology)  CHIEF COMPLAINTS/PURPOSE OF CONSULTATION: ANEMIA   HEMATOLOGY HISTORY  # ANEMIA[Hb-9; Iron sat; ferritin- 6%; Iron sat-6 [PCP] GFR-wnl; CR hematuria-colonoscopy-April 2023 [Dr.Wohl] NEG; EGD/capsulke- none; Oral iron: none sec  Constipation/dyspepsia  # TAH oct 2022; Right hydroKidney stones [Dr.Stoioff- lithotripys pening]  HISTORY OF PRESENTING ILLNESS: Ambulating independently.  Alone.  Kari Acosta 47 y.o.  female pleasant patient with iron deficiency anemia unclear etiology is here for follow-up.  Patient states she is still staying cold all the time. Patient s/p IV iron infusion noted to have improvement of energy levels.  Review of Systems  Constitutional:  Positive for malaise/fatigue. Negative for chills, diaphoresis, fever and weight loss.  HENT:  Negative for nosebleeds and sore throat.   Eyes:  Negative for double vision.  Respiratory:  Positive for shortness of breath. Negative for cough, hemoptysis, sputum production and wheezing.   Cardiovascular:  Negative for chest pain, palpitations, orthopnea and leg swelling.  Gastrointestinal:  Negative for abdominal pain, blood in stool, constipation, diarrhea, heartburn, melena, nausea and vomiting.  Genitourinary:  Negative for dysuria, frequency and urgency.  Musculoskeletal:  Positive for myalgias. Negative for joint pain.  Skin: Negative.  Negative for itching and rash.  Neurological:  Positive for dizziness, tingling and headaches. Negative for focal weakness and weakness.  Endo/Heme/Allergies:  Does not bruise/bleed easily.  Psychiatric/Behavioral:  Negative for depression. The patient is not nervous/anxious and does not have insomnia.      MEDICAL HISTORY:  Past Medical History:  Diagnosis Date  . Anemia   . Complication of  anesthesia    difficult to wake up after hysterectomy  . Family history of colon cancer in mother   . Family history of ovarian cancer 08/2021   cancer genetic testing letter sent  . History of kidney stones   . Hypothyroidism   . Right nephrolithiasis 04/2022    SURGICAL HISTORY: Past Surgical History:  Procedure Laterality Date  . CESAREAN SECTION    . CHOLECYSTECTOMY    . COLONOSCOPY WITH PROPOFOL N/A 02/26/2022   Procedure: COLONOSCOPY WITH PROPOFOL;  Surgeon: Lucilla Lame, MD;  Location: [facility] ENDOSCOPY;  Service: Endoscopy;  Laterality: N/A;  . CYSTOSCOPY  09/23/2021   Procedure: CYSTOSCOPY;  Surgeon: Gae Dry, MD;  Location: [facility] ORS;  Service: Gynecology;;  . CYSTOSCOPY/URETEROSCOPY/HOLMIUM LASER/STENT PLACEMENT Right 05/05/2022   Procedure: CYSTOSCOPY/URETEROSCOPY/HOLMIUM LASER/STENT EXCHANGE;  Surgeon: Abbie Sons, MD;  Location: [facility] ORS;  Service: Urology;  Laterality: Right;  . CYSTOSCOPY/URETEROSCOPY/HOLMIUM LASER/STENT PLACEMENT Right 04/21/2022   Procedure: CYSTOSCOPY/URETEROSCOPY/HOLMIUM LASER/STENT PLACEMENT;  Surgeon: Abbie Sons, MD;  Location: [facility] ORS;  Service: Urology;  Laterality: Right;  . LAPAROSCOPIC BILATERAL SALPINGECTOMY Bilateral 09/23/2021   Procedure: LAPAROSCOPIC BILATERAL SALPINGECTOMY;  Surgeon: Gae Dry, MD;  Location: [facility] ORS;  Service: Gynecology;  Laterality: Bilateral;  . LAPAROSCOPIC SUPRACERVICAL HYSTERECTOMY  09/23/2021   Procedure: LAPAROSCOPIC SUPRACERVICAL HYSTERECTOMY WITH ABLATION OF ENDOMETRIAL CELLS;  Surgeon: Gae Dry, MD;  Location: [facility] ORS;  Service: Gynecology;;    SOCIAL HISTORY: Social History   Socioeconomic History  . Marital status: Significant Other    Spouse name: Not on file  . Number of children: 1  . Years of education: Not on file  . Highest education level: Not on file  Occupational History  . Not on file  Tobacco Use  . Smoking status: Never  .  Smokeless tobacco: Never   Vaping Use  . Vaping Use: Never used  Substance and Sexual Activity  . Alcohol use: No  . Drug use: Never  . Sexual activity: Yes  Other Topics Concern  . 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     Social Determinants of Health   Financial Resource Strain: Not on file  Food Insecurity: Not on file  Transportation Needs: Not on file  Physical Activity: Not on file  Stress: Not on file  Social Connections: Not on file  Intimate Partner Violence: Not on file    FAMILY HISTORY: Family History  Problem Relation Age of Onset  . Colon cancer Mother 39  . Hypertension Mother   . Hyperlipidemia Father   . Hypertension Father   . Stroke Father   . Healthy Son   . Healthy Son   . Ovarian cancer Maternal Aunt   . Breast cancer Maternal Grandmother   . Cancer Maternal Grandmother        Gallbladder Cancer  . Lung cancer Maternal Grandmother   . Hypertension Maternal Grandfather   . Heart failure Paternal Grandmother   . Tuberculosis Paternal Grandfather   . Cancer - Other Maternal Great-grandmother        Gallbladder Cancer    ALLERGIES:  has No Known Allergies.  MEDICATIONS:  Current Outpatient Medications  Medication Sig Dispense Refill  . levothyroxine (SYNTHROID) 75 MCG tablet Take 1 tablet (75 mcg total) by mouth daily. 90 tablet 1   No current facility-administered medications for this visit.     Marland Kitchen  PHYSICAL EXAMINATION:   There were no vitals filed for this visit.  There were no vitals filed for this visit.   Physical Exam Vitals and nursing note reviewed.  HENT:     Head: Normocephalic and atraumatic.     Mouth/Throat:     Pharynx: Oropharynx is clear.  Eyes:     Extraocular Movements: Extraocular movements intact.     Pupils: Pupils are equal, round, and reactive to light.  Cardiovascular:     Rate and Rhythm: Normal rate and regular  rhythm.  Pulmonary:     Comments: Decreased breath sounds bilaterally.  Abdominal:     Palpations: Abdomen is soft.  Musculoskeletal:        General: Normal range of motion.     Cervical back: Normal range of motion.  Skin:    General: Skin is warm.  Neurological:     General: No focal deficit present.     Mental Status: She is alert and oriented to person, place, and time.  Psychiatric:        Behavior: Behavior normal.        Judgment: Judgment normal.     LABORATORY DATA:  I have reviewed the data as listed Lab Results  Component Value Date   WBC 4.9 01/06/2023   HGB 12.9 01/06/2023   HCT 38.5 01/06/2023   MCV 88 01/06/2023   PLT 161 01/06/2023   Recent Labs    04/07/22 1419 04/14/22 1459 06/09/22 1432 10/06/22 1631 11/13/22 1411 01/06/23 1443 01/13/23 1737  NA 134*   < > 136 142 140 142 141  K 3.1*   < > 3.5 3.3* 3.4* 3.1* 3.6  CL 103   < > 100 101 105 102 101  CO2 27   < > 29 27 29 24 28   GLUCOSE 103*   < >  118* 87 94 88 85  BUN 11   < > 11 8 8 7 10   CREATININE 0.66   < > 0.83 0.83 0.76 0.86 0.73  CALCIUM 8.7*   < > 8.7* 9.6 8.8* 8.6* 9.4  GFRNONAA >60  --  >60  --  >60  --   --   PROT 7.3   < >  --  6.9  --  6.7 6.8  ALBUMIN 3.7   < >  --  4.3  --  4.0 4.3  AST 15   < >  --  19  --  19 19  ALT 11   < >  --  14  --  9 10  ALKPHOS 39   < >  --  46  --  47 47  BILITOT 0.3   < >  --  0.8  --  0.4 0.5   < > = values in this interval not displayed.      No results found.  ASSESSMENT & PLAN:   Iron deficiency #Iron deficient anemia [hx TAH]: 9.8; ferritin 6/iron saturation 6% [April 2023]-poor tolerance to oral iron.  S/p IV iron infusion   #June 2023 hemoglobin is 12.3.  As patient symptomatic proceed with Venofer.   #Etiology of iron deficiency: April 2023 colonoscopy negative for any acute chronic blood loss.  Recommend further work-up including EGD /possible capsule study. Discussed with pt to conact Dr.Wohl's office. CT stone protocol-April 2023  negative for any acute process.  # Mild intermittent thrombocytopenia- 130-140s- STABLE.   # History of kidney stones/chronic mild hydronephrosis right side-s/p staged lithotripsy x 2  [ May & June 2023-Dr. Stoioff]-improved.  # DISPOSITION: # venofer today # follow up in 4 months- labs- cbc/bmp; iron studies; ferritin; possible venofer-Dr.B  All questions were answered. The patient knows to call the clinic with any problems, questions or concerns.    Cammie Sickle, MD 03/15/2023 8:42 AM

## 2023-04-23 ENCOUNTER — Other Ambulatory Visit: Payer: Self-pay

## 2023-04-23 ENCOUNTER — Other Ambulatory Visit
Admission: RE | Admit: 2023-04-23 | Discharge: 2023-04-23 | Disposition: A | Discharge status: Home / Self Care | Payer: BC Managed Care – PPO | Source: Ambulatory Visit | Attending: Family Medicine | Admitting: Family Medicine

## 2023-04-23 DIAGNOSIS — R109 Unspecified abdominal pain: Secondary | ICD-10-CM | POA: Diagnosis not present

## 2023-04-23 DIAGNOSIS — R0789 Other chest pain: Secondary | ICD-10-CM | POA: Insufficient documentation

## 2023-04-23 DIAGNOSIS — R1011 Right upper quadrant pain: Secondary | ICD-10-CM | POA: Diagnosis not present

## 2023-04-23 DIAGNOSIS — E039 Hypothyroidism, unspecified: Secondary | ICD-10-CM | POA: Diagnosis not present

## 2023-04-23 DIAGNOSIS — R918 Other nonspecific abnormal finding of lung field: Secondary | ICD-10-CM | POA: Insufficient documentation

## 2023-04-23 DIAGNOSIS — R071 Chest pain on breathing: Secondary | ICD-10-CM | POA: Insufficient documentation

## 2023-04-23 DIAGNOSIS — R079 Chest pain, unspecified: Secondary | ICD-10-CM | POA: Diagnosis not present

## 2023-04-23 DIAGNOSIS — R110 Nausea: Secondary | ICD-10-CM | POA: Diagnosis not present

## 2023-04-23 LAB — D-DIMER, QUANTITATIVE: D-Dimer, Quant: 0.72 ug/mL-FEU — ABNORMAL HIGH (ref 0.00–0.50)

## 2023-04-23 LAB — CBC
HCT: 37.9 % (ref 36.0–46.0)
Hemoglobin: 12.3 g/dL (ref 12.0–15.0)
MCH: 29.6 pg (ref 26.0–34.0)
MCHC: 32.5 g/dL (ref 30.0–36.0)
MCV: 91.3 fL (ref 80.0–100.0)
Platelets: 118 10*3/uL — ABNORMAL LOW (ref 150–400)
RBC: 4.15 MIL/uL (ref 3.87–5.11)
RDW: 12.5 % (ref 11.5–15.5)
WBC: 10.4 10*3/uL (ref 4.0–10.5)
nRBC: 0 % (ref 0.0–0.2)

## 2023-04-23 NOTE — ED Triage Notes (Addendum)
Pt to ED via POV c/o right sided cp that started this morning. Pt went to clinic today and was told ddimer was elevated and to come to ED. Denies SOB, fevers

## 2023-04-24 ENCOUNTER — Emergency Department
Admission: EM | Admit: 2023-04-24 | Discharge: 2023-04-24 | Disposition: A | Discharge status: Home / Self Care | Payer: BC Managed Care – PPO | Attending: Emergency Medicine | Admitting: Emergency Medicine

## 2023-04-24 ENCOUNTER — Emergency Department: Payer: BC Managed Care – PPO

## 2023-04-24 DIAGNOSIS — R071 Chest pain on breathing: Secondary | ICD-10-CM

## 2023-04-24 DIAGNOSIS — R0789 Other chest pain: Secondary | ICD-10-CM | POA: Diagnosis not present

## 2023-04-24 DIAGNOSIS — R918 Other nonspecific abnormal finding of lung field: Secondary | ICD-10-CM

## 2023-04-24 LAB — BASIC METABOLIC PANEL
Anion gap: 4 — ABNORMAL LOW (ref 5–15)
BUN: 18 mg/dL (ref 6–20)
CO2: 32 mmol/L (ref 22–32)
Calcium: 9.1 mg/dL (ref 8.9–10.3)
Chloride: 101 mmol/L (ref 98–111)
Creatinine, Ser: 0.93 mg/dL (ref 0.44–1.00)
GFR, Estimated: >60 mL/min (ref >60)
Glucose, Bld: 90 mg/dL (ref 70–99)
Potassium: 3.3 mmol/L — ABNORMAL LOW (ref 3.5–5.1)
Sodium: 137 mmol/L (ref 135–145)

## 2023-04-24 LAB — TROPONIN I (HIGH SENSITIVITY): Troponin I (High Sensitivity): 3 ng/L (ref <18)

## 2023-04-24 MED ORDER — CEPHALEXIN 500 MG PO CAPS: 1000.0000 mg | ORAL_CAPSULE | Freq: Two times a day (BID) | ORAL | 0 refills | Status: AC

## 2023-04-24 MED ORDER — IOHEXOL 350 MG/ML SOLN
75.0000 mL | Freq: Once | INTRAVENOUS | Status: AC | PRN
  Administered 2023-04-24: 75 mL via INTRAVENOUS

## 2023-04-24 NOTE — ED Provider Notes (Signed)
[facility] Provider Note   Event Date/Time   First MD Initiated Contact with Patient 04/24/23 0018     (approximate) History  Chest Pain  HPI Kari Acosta is a 47 y.o. female with a past medical history of hypothyroidism and iron deficiency who presents complaining of right-sided chest pain that began this morning.  Patient was evaluated in clinic this morning was told to have an elevated D-dimer and present to the emergency department for further evaluation.  Patient describes his pain as a 6/10 that is nonradiating to the lower left chest and worse when taking a deep breath or coughing.  Patient denies any pain similar to this in the past ROS: Patient currently denies any vision changes, tinnitus, difficulty speaking, facial droop, sore throat, shortness of breath, abdominal pain, nausea/vomiting/diarrhea, dysuria, or weakness/numbness/paresthesias in any extremity   Physical Exam  Triage Vital Signs: ED Triage Vitals  Enc Vitals Group     BP 04/23/23 2325 109/83     Pulse Rate 04/23/23 2325 67     Resp 04/23/23 2325 18     Temp 04/23/23 2325 98.5 F (36.9 C)     Temp Source 04/23/23 2325 Oral     SpO2 04/23/23 2325 100 %     Weight 04/23/23 2319 159 lb (72.1 kg)     Height 04/23/23 2319 5\' 7"  (1.702 m)     Head Circumference --      Peak Flow --      Pain Score 04/23/23 2319 6     Pain Loc --      Pain Edu? --      Excl. in GC? --    Most recent vital signs: Vitals:   04/24/23 0200 04/24/23 0230  BP: 102/73 103/73  Pulse: 72 69  Resp: 13 14  Temp:    SpO2: 98% 100%   General: Awake, oriented x4. CV:  Good peripheral perfusion.  Resp:  Normal effort.  Abd:  No distention.  Other:  Middle-aged well-developed, well-nourished Caucasian female laying in bed in no acute distress ED Results / Procedures / Treatments  Labs (all labs ordered are listed, but only abnormal results are displayed) Labs Reviewed  BASIC METABOLIC PANEL - Abnormal;  Notable for the following components:      Result Value   Potassium 3.3 (*)    Anion gap 4 (*)    All other components within normal limits  CBC - Abnormal; Notable for the following components:   Platelets 118 (*)    All other components within normal limits  TROPONIN I (HIGH SENSITIVITY)   EKG ED ECG REPORT I, Merwyn Katos, the attending physician, personally viewed and interpreted this ECG. Date: 04/24/2023 EKG Time: 2319 Rate: 71 Rhythm: normal sinus rhythm QRS Axis: normal Intervals: normal ST/T Wave abnormalities: normal Narrative Interpretation: no evidence of acute ischemia RADIOLOGY ED MD interpretation: CT angiography of the chest interpreted independently by me and shows moderate severity right middle lobe infiltrate.  There is recommendation to follow-up to resolution as an underlying neoplastic process cannot be excluded.  There is no evidence of pulmonary embolism.  Numerous bilateral subcentimeter nonobstructing renal calculi -Agree with radiology assessment Official radiology report(s): CT Angio Chest PE W/Cm &/Or Wo Cm  Result Date: 04/24/2023 CLINICAL DATA:  Right-sided chest pain. EXAM: CT ANGIOGRAPHY CHEST WITH CONTRAST TECHNIQUE: Multidetector CT imaging of the chest was performed using the standard protocol during bolus administration of intravenous contrast. Multiplanar CT image reconstructions and MIPs  were obtained to evaluate the vascular anatomy. RADIATION DOSE REDUCTION: This exam was performed according to the departmental dose-optimization program which includes automated exposure control, adjustment of the mA and/or kV according to patient size and/or use of iterative reconstruction technique. CONTRAST:  75mL OMNIPAQUE IOHEXOL 350 MG/ML SOLN COMPARISON:  None Available. FINDINGS: Cardiovascular: The thoracic aorta is normal in appearance. Satisfactory opacification of the pulmonary arteries to the segmental level. No evidence of pulmonary embolism. Normal  heart size. No pericardial effusion. Mediastinum/Nodes: No enlarged mediastinal, hilar, or axillary lymph nodes. Thyroid gland, trachea, and esophagus demonstrate no significant findings. Lungs/Pleura: Moderate severity patchy infiltrate is seen along the inferior medial aspect of the right middle lobe. There is no evidence of a pleural effusion or pneumothorax. Upper Abdomen: Multiple surgical clips are seen within the gallbladder fossa. Numerous subcentimeter bilateral nonobstructing renal calculi are seen. A 15 mm diameter cyst is seen within the anterior aspect of the mid right kidney. Musculoskeletal: No acute osseous abnormalities are identified. Review of the MIP images confirms the above findings. IMPRESSION: 1. Moderate severity right middle lobe infiltrate. Follow-up to resolution is recommended, as an underlying neoplastic process cannot be excluded. 2. No evidence of pulmonary embolism. 3. Numerous bilateral subcentimeter nonobstructing renal calculi. 4. Evidence of prior cholecystectomy. 5. Right renal cyst. No follow-up imaging is recommended. This recommendation follows ACR consensus guidelines: Management of the Incidental Renal Mass on CT: A White Paper of the ACR Incidental Findings Committee. J Am Coll Radiol [phone]. Electronically Signed   By: Aram Candela M.D.   On: 04/24/2023 00:48   PROCEDURES: Critical Care performed: No .1-3 Lead EKG Interpretation  Performed by: Merwyn Katos, MD Authorized by: Merwyn Katos, MD     Interpretation: normal     ECG rate:  71   ECG rate assessment: normal     Rhythm: sinus rhythm     Ectopy: none     Conduction: normal    MEDICATIONS ORDERED IN ED: Medications  iohexol (OMNIPAQUE) 350 MG/ML injection 75 mL (75 mLs Intravenous Contrast Given 04/24/23 0035)   IMPRESSION / MDM / ASSESSMENT AND PLAN / ED COURSE  I reviewed the triage vital signs and the nursing notes.                             The patient is on the cardiac  monitor to evaluate for evidence of arrhythmia and/or significant heart rate changes. Patient's presentation is most consistent with acute presentation with potential threat to life or bodily function. Workup: ECG, CXR, CBC, BMP, Troponin Findings: ECG: No overt evidence of STEMI. No evidence of Brugadas sign, delta wave, epsilon wave, significantly prolonged QTc, or malignant arrhythmia HS Troponin: Negative x1 Other Labs unremarkable for emergent problems. CXR: Without PTX, PNA, or widened mediastinum HEART Score: 2  Given History, Exam, and Workup I have low suspicion for ACS, Pneumothorax, Pneumonia, Pulmonary Embolus, Tamponade, Aortic Dissection or other emergent problem as a cause for this presentation.   Reassesment: Prior to discharge patients pain was controlled and they were well appearing.  Patient explained results of CT scan including concern for possible neoplasm however will treat empirically for pneumonia.  Patient expressed understanding agrees with plan for discharge with PCP and oncologic follow-up  Disposition:  Discharge. Strict return precautions discussed with patient with full understanding. Advised patient to follow up promptly with primary care provider    FINAL CLINICAL IMPRESSION(S) / ED DIAGNOSES  Final diagnoses:  Chest pain on breathing  Infiltrate of right lung present on chest x-ray   Rx / DC Orders   ED Discharge Orders          Ordered    cephALEXin (KEFLEX) 500 MG capsule  2 times daily        04/24/23 0223           Note:  This document was prepared using Dragon voice recognition software and may include unintentional dictation errors.   Merwyn Katos, MD 04/24/23 [phone]

## 2023-04-24 NOTE — ED Notes (Signed)
PT brought to ed rm 16 at this time, this RN now assuming care.  

## 2023-04-28 ENCOUNTER — Encounter: Payer: Self-pay | Admitting: Nurse Practitioner

## 2023-04-28 ENCOUNTER — Ambulatory Visit (INDEPENDENT_AMBULATORY_CARE_PROVIDER_SITE_OTHER): Payer: BC Managed Care – PPO | Admitting: Nurse Practitioner

## 2023-04-28 VITALS — BP 127/82 | HR 86 | Temp 98.1°F | Wt 164.1 lb

## 2023-04-28 DIAGNOSIS — E611 Iron deficiency: Secondary | ICD-10-CM

## 2023-04-28 DIAGNOSIS — E876 Hypokalemia: Secondary | ICD-10-CM

## 2023-04-28 DIAGNOSIS — R918 Other nonspecific abnormal finding of lung field: Secondary | ICD-10-CM | POA: Diagnosis not present

## 2023-04-28 NOTE — Assessment & Plan Note (Signed)
Labs ordered at visit today.  Will make recommendations based on lab results.   

## 2023-04-28 NOTE — Assessment & Plan Note (Signed)
Found on CT on 5/11.  Ruled out PE.  Recommended to repeat imaging to ensure resolution.  CBC and CMP repeated at visit today.  Complete course of antibiotics. Discussed incidental findings on CT with patient during visit.  Follow up if not improved.

## 2023-04-28 NOTE — Progress Notes (Unsigned)
BP 127/82   Pulse 86   Temp 98.1 F (36.7 C) (Oral)   Wt 164 lb 1.6 oz (74.4 kg)   LMP 09/22/2021 (Approximate)   SpO2 100%   BMI 25.70 kg/m    Subjective:    Patient ID: Kari Acosta, female    DOB: [birth year], 47 y.o.   MRN: [id number]  HPI: Kari Acosta is a 47 y.o. female  Chief Complaint  Patient presents with   Hospitalization Follow-up   Patient states she was seen in the ER on 5/11 due to chest pain on the right side.  A CT scan was done and it found an infiltrate on in the right middle lobe.  She has been taking cephalexin for 4 days and feeling much better.  Stabbing pain is gone.  No other symptoms present.  Denies SOB.   Relevant past medical, surgical, family and social history reviewed and updated as indicated. Interim medical history since our last visit reviewed. Allergies and medications reviewed and updated.  Review of Systems  Respiratory:  Negative for shortness of breath.   Cardiovascular:  Negative for chest pain.    Per HPI unless specifically indicated above     Objective:    BP 127/82   Pulse 86   Temp 98.1 F (36.7 C) (Oral)   Wt 164 lb 1.6 oz (74.4 kg)   LMP 09/22/2021 (Approximate)   SpO2 100%   BMI 25.70 kg/m   Wt Readings from Last 3 Encounters:  04/28/23 164 lb 1.6 oz (74.4 kg)  04/23/23 159 lb (72.1 kg)  01/06/23 162 lb 3.2 oz (73.6 kg)    Physical Exam Vitals and nursing note reviewed.  Constitutional:      General: She is not in acute distress.    Appearance: Normal appearance. She is normal weight. She is not ill-appearing, toxic-appearing or diaphoretic.  HENT:     Head: Normocephalic.     Right Ear: External ear normal.     Left Ear: External ear normal.     Nose: Nose normal.     Mouth/Throat:     Mouth: Mucous membranes are moist.     Pharynx: Oropharynx is clear.  Eyes:     General:        Right eye: No discharge.        Left eye: No discharge.     Extraocular Movements: Extraocular movements intact.      Conjunctiva/sclera: Conjunctivae normal.     Pupils: Pupils are equal, round, and reactive to light.  Cardiovascular:     Rate and Rhythm: Normal rate and regular rhythm.     Heart sounds: No murmur heard. Pulmonary:     Effort: Pulmonary effort is normal. No respiratory distress.     Breath sounds: Normal breath sounds. No wheezing or rales.  Musculoskeletal:     Cervical back: Normal range of motion and neck supple.  Skin:    General: Skin is warm and dry.     Capillary Refill: Capillary refill takes less than 2 seconds.  Neurological:     General: No focal deficit present.     Mental Status: She is alert and oriented to person, place, and time. Mental status is at baseline.  Psychiatric:        Mood and Affect: Mood normal.        Behavior: Behavior normal.        Thought Content: Thought content normal.        Judgment: Judgment normal.  Results for orders placed or performed in visit on 04/28/23  Comp Met (CMET)  Result Value Ref Range   Glucose 86 70 - 99 mg/dL   BUN 11 6 - 24 mg/dL   Creatinine, Ser 1.61 0.57 - 1.00 mg/dL   eGFR 096 >04 VW/UJW/1.19   BUN/Creatinine Ratio 16 9 - 23   Sodium 139 134 - 144 mmol/L   Potassium 4.3 3.5 - 5.2 mmol/L   Chloride 101 96 - 106 mmol/L   CO2 25 20 - 29 mmol/L   Calcium 9.2 8.7 - 10.2 mg/dL   Total Protein 6.6 6.0 - 8.5 g/dL   Albumin 4.0 3.9 - 4.9 g/dL   Globulin, Total 2.6 1.5 - 4.5 g/dL   Albumin/Globulin Ratio 1.5 1.2 - 2.2   Bilirubin Total 0.3 0.0 - 1.2 mg/dL   Alkaline Phosphatase 43 (L) 44 - 121 IU/L   AST 14 0 - 40 IU/L   ALT 15 0 - 32 IU/L  CBC w/Diff  Result Value Ref Range   WBC 6.5 3.4 - 10.8 x10E3/uL   RBC 4.05 3.77 - 5.28 x10E6/uL   Hemoglobin 11.9 11.1 - 15.9 g/dL   Hematocrit 14.7 82.9 - 46.6 %   MCV 89 79 - 97 fL   MCH 29.4 26.6 - 33.0 pg   MCHC 33.1 31.5 - 35.7 g/dL   RDW 56.2 13.0 - 86.5 %   Platelets 201 150 - 450 x10E3/uL   Neutrophils 62 Not Estab. %   Lymphs 22 Not Estab. %    Monocytes 9 Not Estab. %   Eos 5 Not Estab. %   Basos 1 Not Estab. %   Neutrophils Absolute 4.2 1.4 - 7.0 x10E3/uL   Lymphocytes Absolute 1.4 0.7 - 3.1 x10E3/uL   Monocytes Absolute 0.6 0.1 - 0.9 x10E3/uL   EOS (ABSOLUTE) 0.3 0.0 - 0.4 x10E3/uL   Basophils Absolute 0.1 0.0 - 0.2 x10E3/uL   Immature Granulocytes 1 Not Estab. %   Immature Grans (Abs) 0.0 0.0 - 0.1 x10E3/uL  Iron, TIBC and Ferritin Panel  Result Value Ref Range   Total Iron Binding Capacity 219 (L) 250 - 450 ug/dL   UIBC 784 696 - 295 ug/dL   Iron 23 (L) 27 - 284 ug/dL   Iron Saturation 11 (L) 15 - 55 %   Ferritin WILL FOLLOW       Assessment & Plan:   Problem List Items Addressed This Visit       Respiratory   Right middle lobe pulmonary infiltrate - Primary    Found on CT on 5/11.  Ruled out PE.  Recommended to repeat imaging to ensure resolution.  CBC and CMP repeated at visit today.  Complete course of antibiotics. Discussed incidental findings on CT with patient during visit.  Follow up if not improved.       Relevant Orders   CBC w/Diff (Completed)   CT Chest Wo Contrast     Other   Iron deficiency    Labs ordered at visit today.  Will make recommendations based on lab results.        Relevant Orders   Iron, TIBC and Ferritin Panel (Completed)   Other Visit Diagnoses     Hypokalemia       Labs ordered at visit today. Will make recommendations based on lab results.   Relevant Orders   Comp Met (CMET) (Completed)        Follow up plan: Return if symptoms worsen or fail to improve.

## 2023-04-29 ENCOUNTER — Encounter: Payer: Self-pay | Admitting: Nurse Practitioner

## 2023-04-29 LAB — CBC WITH DIFFERENTIAL/PLATELET
Basos: 1 %
EOS (ABSOLUTE): 0.3 10*3/uL (ref 0.0–0.4)
RBC: 4.05 x10E6/uL (ref 3.77–5.28)

## 2023-04-29 LAB — COMPREHENSIVE METABOLIC PANEL
Albumin/Globulin Ratio: 1.5 (ref 1.2–2.2)
BUN/Creatinine Ratio: 16 (ref 9–23)
Glucose: 86 mg/dL (ref 70–99)
eGFR: 109 mL/min/{1.73_m2} (ref >59)

## 2023-04-29 LAB — IRON,TIBC AND FERRITIN PANEL
Iron Saturation: 11 % — ABNORMAL LOW (ref 15–55)
Iron: 23 ug/dL — ABNORMAL LOW (ref 27–159)
Total Iron Binding Capacity: 219 ug/dL — ABNORMAL LOW (ref 250–450)

## 2023-04-30 ENCOUNTER — Ambulatory Visit
Admission: RE | Admit: 2023-04-30 | Discharge: 2023-04-30 | Disposition: A | Discharge status: Home / Self Care | Payer: BC Managed Care – PPO | Source: Ambulatory Visit | Attending: Nurse Practitioner | Admitting: Nurse Practitioner

## 2023-04-30 DIAGNOSIS — Z1231 Encounter for screening mammogram for malignant neoplasm of breast: Secondary | ICD-10-CM | POA: Diagnosis not present

## 2023-04-30 LAB — COMPREHENSIVE METABOLIC PANEL
ALT: 15 IU/L (ref 0–32)
AST: 14 IU/L (ref 0–40)
Albumin: 4 g/dL (ref 3.9–4.9)
Alkaline Phosphatase: 43 IU/L — ABNORMAL LOW (ref 44–121)
BUN: 11 mg/dL (ref 6–24)
Bilirubin Total: 0.3 mg/dL (ref 0.0–1.2)
CO2: 25 mmol/L (ref 20–29)
Calcium: 9.2 mg/dL (ref 8.7–10.2)
Chloride: 101 mmol/L (ref 96–106)
Creatinine, Ser: 0.68 mg/dL (ref 0.57–1.00)
Globulin, Total: 2.6 g/dL (ref 1.5–4.5)
Potassium: 4.3 mmol/L (ref 3.5–5.2)
Sodium: 139 mmol/L (ref 134–144)
Total Protein: 6.6 g/dL (ref 6.0–8.5)

## 2023-04-30 LAB — CBC WITH DIFFERENTIAL/PLATELET
Basophils Absolute: 0.1 10*3/uL (ref 0.0–0.2)
Eos: 5 %
Hematocrit: 35.9 % (ref 34.0–46.6)
Hemoglobin: 11.9 g/dL (ref 11.1–15.9)
Immature Grans (Abs): 0 10*3/uL (ref 0.0–0.1)
Immature Granulocytes: 1 %
Lymphocytes Absolute: 1.4 10*3/uL (ref 0.7–3.1)
Lymphs: 22 %
MCH: 29.4 pg (ref 26.6–33.0)
MCHC: 33.1 g/dL (ref 31.5–35.7)
MCV: 89 fL (ref 79–97)
Monocytes Absolute: 0.6 10*3/uL (ref 0.1–0.9)
Monocytes: 9 %
Neutrophils Absolute: 4.2 10*3/uL (ref 1.4–7.0)
Neutrophils: 62 %
Platelets: 201 10*3/uL (ref 150–450)
RDW: 11.9 % (ref 11.7–15.4)
WBC: 6.5 10*3/uL (ref 3.4–10.8)

## 2023-04-30 LAB — IRON,TIBC AND FERRITIN PANEL
Ferritin: 89 ng/mL (ref 15–150)
UIBC: 196 ug/dL (ref 131–425)

## 2023-04-30 NOTE — Progress Notes (Signed)
Hi Kari Acosta.  Your lab work looks good.  Your iron levels are low.  It looks like you saw the Hematologist back on December and he wanted you to follow up in 4 months.  I recommend making an appt with them to look at your iron studies and decide on a course of treatment.

## 2023-05-04 ENCOUNTER — Other Ambulatory Visit: Payer: Self-pay | Admitting: Nurse Practitioner

## 2023-05-04 DIAGNOSIS — R928 Other abnormal and inconclusive findings on diagnostic imaging of breast: Secondary | ICD-10-CM

## 2023-05-04 DIAGNOSIS — N6489 Other specified disorders of breast: Secondary | ICD-10-CM

## 2023-05-04 NOTE — Progress Notes (Signed)
HI [place].  The Radiologist noted some asymmetry of your right breast.  They are recommending further imaging.  The breast center will reach out to you to get this scheduled.

## 2023-05-11 ENCOUNTER — Ambulatory Visit
Admission: RE | Admit: 2023-05-11 | Discharge: 2023-05-11 | Disposition: A | Discharge status: Home / Self Care | Payer: BC Managed Care – PPO | Source: Ambulatory Visit | Attending: Nurse Practitioner | Admitting: Nurse Practitioner

## 2023-05-11 DIAGNOSIS — R928 Other abnormal and inconclusive findings on diagnostic imaging of breast: Secondary | ICD-10-CM | POA: Diagnosis not present

## 2023-05-11 DIAGNOSIS — N6489 Other specified disorders of breast: Secondary | ICD-10-CM

## 2023-05-11 DIAGNOSIS — N6011 Diffuse cystic mastopathy of right breast: Secondary | ICD-10-CM | POA: Diagnosis not present

## 2023-05-11 DIAGNOSIS — R92321 Mammographic fibroglandular density, right breast: Secondary | ICD-10-CM | POA: Diagnosis not present

## 2023-05-13 ENCOUNTER — Other Ambulatory Visit: Payer: Self-pay

## 2023-05-13 DIAGNOSIS — E611 Iron deficiency: Secondary | ICD-10-CM

## 2023-05-14 ENCOUNTER — Inpatient Hospital Stay: Payer: BC Managed Care – PPO | Attending: Internal Medicine | Admitting: Internal Medicine

## 2023-05-14 ENCOUNTER — Encounter: Payer: Self-pay | Admitting: Internal Medicine

## 2023-05-14 VITALS — BP 137/96 | HR 89 | Temp 99.4°F | Ht 67.0 in | Wt 158.4 lb

## 2023-05-14 DIAGNOSIS — Z87442 Personal history of urinary calculi: Secondary | ICD-10-CM | POA: Diagnosis not present

## 2023-05-14 DIAGNOSIS — D696 Thrombocytopenia, unspecified: Secondary | ICD-10-CM | POA: Diagnosis not present

## 2023-05-14 DIAGNOSIS — E611 Iron deficiency: Secondary | ICD-10-CM | POA: Diagnosis not present

## 2023-05-14 DIAGNOSIS — D509 Iron deficiency anemia, unspecified: Secondary | ICD-10-CM | POA: Diagnosis not present

## 2023-05-14 DIAGNOSIS — N6011 Diffuse cystic mastopathy of right breast: Secondary | ICD-10-CM | POA: Diagnosis not present

## 2023-05-14 DIAGNOSIS — G479 Sleep disorder, unspecified: Secondary | ICD-10-CM | POA: Diagnosis not present

## 2023-05-14 NOTE — Assessment & Plan Note (Addendum)
#  Iron deficient anemia [hx TAH]: 9.8; ferritin 6/iron saturation 6% [April 2023]-poor tolerance to oral iron.  S/p IV iron infusion   # MAY 2024- hemoglobin is 11; Iron 11%; ferritin- 89.   As patient symptomatic proceed with Venofer next week. Marland Kitchen #Etiology of iron deficiency: April 2023 colonoscopy [Dr.Wohl] negative for any acute chronic blood loss.  Recommend further work-up including EGD /possible capsule study.MAY 2024- CT chest-no esophagus abnormalities noted.   # MAY 15th, 2024- [chest pain]- Moderate severity right middle lobe infiltrate/pneumonia s/p antibiotic.  No acute PE follow-up  CT scan in June 2024.   # MAY 2024- Mammo Right Breast- island of fibroglandular tissue and associated fibrocystic changes; awaiting repeat mammogram in 6 months/November 2024/PCP.  # Mild intermittent thrombocytopenia- > 100-  stable.   # History of kidney stones/chronic mild hydronephrosis right side-s/p staged lithotripsy x 2  [ May & June 2023-Dr. Stoioff]-improved. Stable.   # DISPOSITION: # next week venofer  # follow up in 6  months- labs- cbc/bmp; iron studies; ferritin; possible venofer-Dr.B

## 2023-05-14 NOTE — Progress Notes (Signed)
Fatigue/weakness: yes Dyspena: no  Light headedness: no  Blood in stool: no  Pt has been tx/seen for spot on her lung, and spot found on rt breast mammogram and kidney stones.

## 2023-05-14 NOTE — Progress Notes (Signed)
[facility] CONSULT NOTE  Patient Care Team: [name], NP as PCP - General (Nurse Practitioner) [name], MD as Consulting Physician (Oncology)  CHIEF COMPLAINTS/PURPOSE OF CONSULTATION: ANEMIA   HEMATOLOGY HISTORY  # ANEMIA[Hb-9; Iron sat; ferritin- 6%; Iron sat-6 [PCP] GFR-wnl; CR hematuria-colonoscopy-April 2023 [Dr.Wohl] NEG; EGD/capsulke- none; Oral iron: none sec  Constipation/dyspepsia  # TAH oct 2022; Right hydroKidney stones [Dr.Stoioff- lithotripys pening]  HISTORY OF PRESENTING ILLNESS: Ambulating independently.  Alone.  Kari Acosta 47 y.o.  female pleasant patient with iron deficiency anemia unclear etiology is here for follow-up.  Patient has ongoing fatigue.  Denies any blood in stools or black or stools.  Notes to have some improvement of fatigue after iron infusion.  Of note patient recently diagnosed with pneumonia status post antibiotics.   Patient has chronic insomnia and difficulty sleeping at night.  Review of Systems  Constitutional:  Positive for malaise/fatigue. Negative for chills, diaphoresis, fever and weight loss.  HENT:  Negative for nosebleeds and sore throat.   Eyes:  Negative for double vision.  Respiratory:  Negative for cough, hemoptysis, sputum production and wheezing.   Cardiovascular:  Negative for chest pain, palpitations, orthopnea and leg swelling.  Gastrointestinal:  Negative for abdominal pain, blood in stool, constipation, diarrhea, heartburn, melena, nausea and vomiting.  Genitourinary:  Negative for dysuria, frequency and urgency.  Musculoskeletal:  Negative for joint pain.  Skin: Negative.  Negative for itching and rash.  Neurological:  Negative for focal weakness and weakness.  Endo/Heme/Allergies:  Does not bruise/bleed easily.  Psychiatric/Behavioral:  Negative for depression. The patient is not nervous/anxious and does not have insomnia.      MEDICAL HISTORY:  Past Medical History:   Diagnosis Date   Anemia    Complication of anesthesia    difficult to wake up after hysterectomy   Family history of colon cancer in mother    Family history of ovarian cancer 08/2021   cancer genetic testing letter sent   History of kidney stones    Hypothyroidism    Right nephrolithiasis 04/2022    SURGICAL HISTORY: Past Surgical History:  Procedure Laterality Date   CESAREAN SECTION     CHOLECYSTECTOMY     COLONOSCOPY WITH PROPOFOL N/A 02/26/2022   Procedure: COLONOSCOPY WITH PROPOFOL;  Surgeon: Midge Minium, MD;  Location: [facility];  Service: Endoscopy;  Laterality: N/A;   CYSTOSCOPY  09/23/2021   Procedure: CYSTOSCOPY;  Surgeon: Nadara Mustard, MD;  Location: [facility];  Service: Gynecology;;   CYSTOSCOPY/URETEROSCOPY/HOLMIUM LASER/STENT PLACEMENT Right 05/05/2022   Procedure: CYSTOSCOPY/URETEROSCOPY/HOLMIUM LASER/STENT EXCHANGE;  Surgeon: Riki Altes, MD;  Location: [facility];  Service: Urology;  Laterality: Right;   CYSTOSCOPY/URETEROSCOPY/HOLMIUM LASER/STENT PLACEMENT Right 04/21/2022   Procedure: CYSTOSCOPY/URETEROSCOPY/HOLMIUM LASER/STENT PLACEMENT;  Surgeon: Riki Altes, MD;  Location: [facility];  Service: Urology;  Laterality: Right;   LAPAROSCOPIC BILATERAL SALPINGECTOMY Bilateral 09/23/2021   Procedure: LAPAROSCOPIC BILATERAL SALPINGECTOMY;  Surgeon: Nadara Mustard, MD;  Location: [facility];  Service: Gynecology;  Laterality: Bilateral;   LAPAROSCOPIC SUPRACERVICAL HYSTERECTOMY  09/23/2021   Procedure: LAPAROSCOPIC SUPRACERVICAL HYSTERECTOMY WITH ABLATION OF ENDOMETRIAL CELLS;  Surgeon: Nadara Mustard, MD;  Location: [facility];  Service: Gynecology;;    SOCIAL HISTORY: Social History   Socioeconomic History   Marital status: Significant Other    Spouse name: Not on file   Number of children: 1   Years of education: Not on file   Highest education level: Not on file  Occupational History   Not  on file  Tobacco Use   Smoking status: Never    Smokeless tobacco: Never  Vaping Use   Vaping Use: Never used  Substance and Sexual Activity   Alcohol use: No   Drug use: Never   Sexual activity: Yes  Other Topics Concern   Not on file  Social History Narrative   Lives in [place] with boyfriend; and children; no smoking; no alcohol; 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     Social Determinants of Health   Financial Resource Strain: Not on file  Food Insecurity: Not on file  Transportation Needs: Not on file  Physical Activity: Not on file  Stress: Not on file  Social Connections: Not on file  Intimate Partner Violence: Not on file    FAMILY HISTORY: Family History  Problem Relation Age of Onset   Colon cancer Mother 48   Hypertension Mother    Hyperlipidemia Father    Hypertension Father    Stroke Father    Healthy Son    Healthy Son    Ovarian cancer Maternal Aunt    Breast cancer Maternal Grandmother    Cancer Maternal Grandmother        Gallbladder Cancer   Lung cancer Maternal Grandmother    Hypertension Maternal Grandfather    Heart failure Paternal Grandmother    Tuberculosis Paternal Grandfather    Cancer - Other Maternal Great-grandmother        Gallbladder Cancer    ALLERGIES:  is allergic to prednisone.  MEDICATIONS:  Current Outpatient Medications  Medication Sig Dispense Refill   levothyroxine (SYNTHROID) 75 MCG tablet Take 1 tablet (75 mcg total) by mouth daily. 90 tablet 1   No current facility-administered medications for this visit.     Marland Kitchen  PHYSICAL EXAMINATION:   Vitals:   05/14/23 1524  BP: (!) 137/96  Pulse: 89  Temp: 99.4 F (37.4 C)  SpO2: 100%   Filed Weights   05/14/23 1524  Weight: 158 lb 6.4 oz (71.8 kg)    Physical Exam Vitals and nursing note reviewed.  HENT:     Head: Normocephalic and atraumatic.     Mouth/Throat:     Pharynx: Oropharynx is clear.  Eyes:     Extraocular Movements: Extraocular movements intact.     Pupils: Pupils are equal, round, and  reactive to light.  Cardiovascular:     Rate and Rhythm: Normal rate and regular rhythm.  Pulmonary:     Comments: Decreased breath sounds bilaterally.  Abdominal:     Palpations: Abdomen is soft.  Musculoskeletal:        General: Normal range of motion.     Cervical back: Normal range of motion.  Skin:    General: Skin is warm.  Neurological:     General: No focal deficit present.     Mental Status: She is alert and oriented to person, place, and time.  Psychiatric:        Behavior: Behavior normal.        Judgment: Judgment normal.      LABORATORY DATA:  I have reviewed the data as listed Lab Results  Component Value Date   WBC 6.5 04/28/2023   HGB 11.9 04/28/2023   HCT 35.9 04/28/2023   MCV 89 04/28/2023   PLT 201 04/28/2023   Recent Labs    06/09/22 1432 10/06/22 1631 11/13/22 1411 01/06/23 1443 01/13/23 1737 04/23/23 2330 04/28/23 1507  NA 136   < > 140 142 141 137 139  K 3.5   < >  3.4* 3.1* 3.6 3.3* 4.3  CL 100   < > 105 102 101 101 101  CO2 29   < > 29 24 28  32 25  GLUCOSE 118*   < > 94 88 85 90 86  BUN 11   < > 8 7 10 18 11   CREATININE 0.83   < > 0.76 0.86 0.73 0.93 0.68  CALCIUM 8.7*   < > 8.8* 8.6* 9.4 9.1 9.2  GFRNONAA >60  --  >60  --   --  >60  --   PROT  --    < >  --  6.7 6.8  --  6.6  ALBUMIN  --    < >  --  4.0 4.3  --  4.0  AST  --    < >  --  19 19  --  14  ALT  --    < >  --  9 10  --  15  ALKPHOS  --    < >  --  47 47  --  43*  BILITOT  --    < >  --  0.4 0.5  --  0.3   < > = values in this interval not displayed.     MM 3D DIAGNOSTIC MAMMOGRAM UNILATERAL RIGHT BREAST  Result Date: 05/11/2023 CLINICAL DATA:  Screening recall for a possible right breast asymmetry. EXAM: DIGITAL DIAGNOSTIC UNILATERAL RIGHT MAMMOGRAM WITH TOMOSYNTHESIS; ULTRASOUND RIGHT BREAST LIMITED TECHNIQUE: Right digital diagnostic mammography and breast tomosynthesis was performed.; Targeted ultrasound examination of the right breast was performed COMPARISON:   Previous exam(s). ACR Breast Density Category b: There are scattered areas of fibroglandular density. FINDINGS: Previously described, possible asymmetry in the medial right breast at mid to anterior depth partially effaces on today's additional views. A definite correlate is not identified on the MLO projection. Further evaluation with ultrasound was performed. Targeted ultrasound is performed, showing an island of fibroglandular tissue with a an associated cluster of cysts at the 2 o'clock position 5 cm from the nipple. This may correspond with the mammographic findings. Extensive evaluation of the remainder of the medial right breast was performed with no focal or suspicious sonographic findings identified. IMPRESSION: Probably benign right breast asymmetry seen only on the CC projection. This likely corresponds with an island of fibroglandular tissue and associated fibrocystic changes at the 2 o'clock position. Recommend short-term imaging follow-up. RECOMMENDATION: Diagnostic right breast mammogram and possible ultrasound in 6 months. I have discussed the findings and recommendations with the patient. If applicable, a reminder letter will be sent to the patient regarding the next appointment. BI-RADS CATEGORY  3: Probably benign. Electronically Signed   By: Sande Brothers M.D.   On: 05/11/2023 09:51  [place] LIMITED ULTRASOUND INCLUDING AXILLA RIGHT BREAST  Result Date: 05/11/2023 CLINICAL DATA:  Screening recall for a possible right breast asymmetry. EXAM: DIGITAL DIAGNOSTIC UNILATERAL RIGHT MAMMOGRAM WITH TOMOSYNTHESIS; ULTRASOUND RIGHT BREAST LIMITED TECHNIQUE: Right digital diagnostic mammography and breast tomosynthesis was performed.; Targeted ultrasound examination of the right breast was performed COMPARISON:  Previous exam(s). ACR Breast Density Category b: There are scattered areas of fibroglandular density. FINDINGS: Previously described, possible asymmetry in the medial right breast at mid to anterior  depth partially effaces on today's additional views. A definite correlate is not identified on the MLO projection. Further evaluation with ultrasound was performed. Targeted ultrasound is performed, showing an island of fibroglandular tissue with a an associated cluster of cysts at the 2 o'clock position 5 cm  from the nipple. This may correspond with the mammographic findings. Extensive evaluation of the remainder of the medial right breast was performed with no focal or suspicious sonographic findings identified. IMPRESSION: Probably benign right breast asymmetry seen only on the CC projection. This likely corresponds with an island of fibroglandular tissue and associated fibrocystic changes at the 2 o'clock position. Recommend short-term imaging follow-up. RECOMMENDATION: Diagnostic right breast mammogram and possible ultrasound in 6 months. I have discussed the findings and recommendations with the patient. If applicable, a reminder letter will be sent to the patient regarding the next appointment. BI-RADS CATEGORY  3: Probably benign. Electronically Signed   By: Sande Brothers M.D.   On: 05/11/2023 09:51  MM 3D SCREEN BREAST BILATERAL  Result Date: 05/04/2023 CLINICAL DATA:  Screening. EXAM: DIGITAL SCREENING BILATERAL MAMMOGRAM WITH TOMOSYNTHESIS AND CAD TECHNIQUE: Bilateral screening digital craniocaudal and mediolateral oblique mammograms were obtained. Bilateral screening digital breast tomosynthesis was performed. The images were evaluated with computer-aided detection. COMPARISON:  Previous exam(s). ACR Breast Density Category b: There are scattered areas of fibroglandular density. FINDINGS: In the right breast, a possible asymmetry warrants further evaluation. In the left breast, no findings suspicious for malignancy. IMPRESSION: Further evaluation is suggested for possible asymmetry in the right breast. RECOMMENDATION: Diagnostic mammogram and possibly ultrasound of the right breast. (Code:[id number]-R-42M)  The patient will be contacted regarding the findings, and additional imaging will be scheduled. BI-RADS CATEGORY  0: Incomplete: Need additional imaging evaluation. Electronically Signed   By: Emmaline Kluver M.D.   On: 05/04/2023 10:26   CT Angio Chest PE W/Cm &/Or Wo Cm  Result Date: 04/24/2023 CLINICAL DATA:  Right-sided chest pain. EXAM: CT ANGIOGRAPHY CHEST WITH CONTRAST TECHNIQUE: Multidetector CT imaging of the chest was performed using the standard protocol during bolus administration of intravenous contrast. Multiplanar CT image reconstructions and MIPs were obtained to evaluate the vascular anatomy. RADIATION DOSE REDUCTION: This exam was performed according to the departmental dose-optimization program which includes automated exposure control, adjustment of the mA and/or kV according to patient size and/or use of iterative reconstruction technique. CONTRAST:  75mL OMNIPAQUE IOHEXOL 350 MG/ML SOLN COMPARISON:  None Available. FINDINGS: Cardiovascular: The thoracic aorta is normal in appearance. Satisfactory opacification of the pulmonary arteries to the segmental level. No evidence of pulmonary embolism. Normal heart size. No pericardial effusion. Mediastinum/Nodes: No enlarged mediastinal, hilar, or axillary lymph nodes. Thyroid gland, trachea, and esophagus demonstrate no significant findings. Lungs/Pleura: Moderate severity patchy infiltrate is seen along the inferior medial aspect of the right middle lobe. There is no evidence of a pleural effusion or pneumothorax. Upper Abdomen: Multiple surgical clips are seen within the gallbladder fossa. Numerous subcentimeter bilateral nonobstructing renal calculi are seen. A 15 mm diameter cyst is seen within the anterior aspect of the mid right kidney. Musculoskeletal: No acute osseous abnormalities are identified. Review of the MIP images confirms the above findings. IMPRESSION: 1. Moderate severity right middle lobe infiltrate. Follow-up to resolution  is recommended, as an underlying neoplastic process cannot be excluded. 2. No evidence of pulmonary embolism. 3. Numerous bilateral subcentimeter nonobstructing renal calculi. 4. Evidence of prior cholecystectomy. 5. Right renal cyst. No follow-up imaging is recommended. This recommendation follows ACR consensus guidelines: Management of the Incidental Renal Mass on CT: A White Paper of the ACR Incidental Findings Committee. J Am Coll Radiol [phone]. Electronically Signed   By: Aram Candela M.D.   On: 04/24/2023 00:48    ASSESSMENT & PLAN:   Iron deficiency #Iron deficient anemia [hx  TAH]: 9.8; ferritin 6/iron saturation 6% [April 2023]-poor tolerance to oral iron.  S/p IV iron infusion   # MAY 2024- hemoglobin is 11; Iron 11%; ferritin- 89.   As patient symptomatic proceed with Venofer next week. Marland Kitchen #Etiology of iron deficiency: April 2023 colonoscopy [Dr.Wohl] negative for any acute chronic blood loss.  Recommend further work-up including EGD /possible capsule study.MAY 2024- CT chest-no esophagus abnormalities noted.   # MAY 15th, 2024- [chest pain]- Moderate severity right middle lobe infiltrate/pneumonia s/p antibiotic.  No acute PE follow-up  CT scan in June 2024.   # MAY 2024- Mammo Right Breast- island of fibroglandular tissue and associated fibrocystic changes; awaiting repeat mammogram in 6 months/November 2024/PCP.  # Mild intermittent thrombocytopenia- > 100-  stable.   # History of kidney stones/chronic mild hydronephrosis right side-s/p staged lithotripsy x 2  [ May & June 2023-Dr. Stoioff]-improved. Stable.   # DISPOSITION: # next week venofer  # follow up in 6  months- labs- cbc/bmp; iron studies; ferritin; possible venofer-Dr.B  All questions were answered. The patient knows to call the clinic with any problems, questions or concerns.    [name], MD 05/14/2023 4:03 PM

## 2023-05-17 ENCOUNTER — Inpatient Hospital Stay: Payer: BC Managed Care – PPO | Attending: Internal Medicine

## 2023-05-17 VITALS — BP 132/92 | HR 80 | Temp 99.1°F | Resp 16

## 2023-05-17 DIAGNOSIS — D696 Thrombocytopenia, unspecified: Secondary | ICD-10-CM | POA: Insufficient documentation

## 2023-05-17 DIAGNOSIS — D509 Iron deficiency anemia, unspecified: Secondary | ICD-10-CM | POA: Diagnosis not present

## 2023-05-17 DIAGNOSIS — E611 Iron deficiency: Secondary | ICD-10-CM

## 2023-05-17 MED ORDER — SODIUM CHLORIDE 0.9 % IV SOLN
200.0000 mg | Freq: Once | INTRAVENOUS | Status: AC
  Administered 2023-05-17: 200 mg via INTRAVENOUS
  Filled 2023-05-17: qty 200

## 2023-05-17 MED ORDER — SODIUM CHLORIDE 0.9 % IV SOLN
Freq: Once | INTRAVENOUS | Status: AC
  Filled 2023-05-17: qty 250

## 2023-05-17 NOTE — Progress Notes (Signed)
Patient declined to wait the 30 minutes for post iron infusion observation today. Tolerated infusion well. VSS. 

## 2023-05-20 ENCOUNTER — Ambulatory Visit
Admission: RE | Admit: 2023-05-20 | Discharge: 2023-05-20 | Disposition: A | Discharge status: Home / Self Care | Payer: BC Managed Care – PPO | Source: Ambulatory Visit | Attending: Nurse Practitioner | Admitting: Nurse Practitioner

## 2023-05-20 DIAGNOSIS — R918 Other nonspecific abnormal finding of lung field: Secondary | ICD-10-CM | POA: Diagnosis not present

## 2023-05-27 ENCOUNTER — Telehealth: Payer: Self-pay | Admitting: Nurse Practitioner

## 2023-05-27 NOTE — Telephone Encounter (Signed)
Copied from CRM [id number]. Topic: General - Other >> May 27, 2023  3:48 PM Dominique A wrote: Reason for CRM: Pt is calling back to check on her imaging results from 05/20/23 (ct chest wo contrast). Pt would like a call back to discuss.

## 2023-05-28 NOTE — Telephone Encounter (Signed)
Called over to [facility] outpatient imaging because imaging was done 05/20/23 and has not been resulted. The lady I spoke with stated that she was going to call over to the radiology department and have the scan read ASAP. Advised the patient that we would send her a result message with results.

## 2023-05-28 NOTE — Progress Notes (Signed)
Hi Kari Acosta.  [place] said she called and told them we hadn't received your results and then they showed up.  Your CT showed that your pneumonia has resolved.  There is still some area of collapsed lung.  That will take some time to resolve.  No follow up is recommended.    You do have a kidney stone on the right side but not concerning at this time.

## 2023-06-07 ENCOUNTER — Ambulatory Visit: Payer: BC Managed Care – PPO | Admitting: Nurse Practitioner

## 2023-06-08 ENCOUNTER — Ambulatory Visit: Payer: BC Managed Care – PPO | Admitting: Nurse Practitioner

## 2023-06-08 ENCOUNTER — Encounter: Payer: Self-pay | Admitting: Nurse Practitioner

## 2023-06-08 VITALS — BP 109/72 | HR 78 | Temp 98.7°F | Wt 159.0 lb

## 2023-06-08 DIAGNOSIS — E611 Iron deficiency: Secondary | ICD-10-CM | POA: Diagnosis not present

## 2023-06-08 DIAGNOSIS — E039 Hypothyroidism, unspecified: Secondary | ICD-10-CM

## 2023-06-08 NOTE — Assessment & Plan Note (Signed)
Labs ordered at visit today.  Will make recommendations based on lab results.   

## 2023-06-08 NOTE — Assessment & Plan Note (Signed)
Chronic.  Controlled.  Continue with current medication regimen of Levothyroxine .  Refills sent today.  Labs ordered today.  Return to clinic in 6 months for reevaluation.  Call sooner if concerns arise.

## 2023-06-08 NOTE — Progress Notes (Signed)
BP 109/72   Pulse 78   Temp 98.7 F (37.1 C) (Oral)   Wt 159 lb (72.1 kg)   LMP 09/22/2021 (Approximate)   SpO2 98%   BMI 24.90 kg/m    Subjective:    Patient ID: Kari Acosta, female    DOB: [birth year], 47 y.o.   MRN: [id number]  HPI: Kari Acosta is a 47 y.o. female  Chief Complaint  Patient presents with   Hypothyroidism   HYPOTHYROIDISM Thyroid control status:controlled Satisfied with current treatment? yes Medication side effects: no Medication compliance: excellent compliance Etiology of hypothyroidism:  Recent dose adjustment:no Fatigue: yes Cold intolerance: no Heat intolerance: no Weight gain: no Weight loss: no Constipation: yes Diarrhea/loose stools: no Palpitations: no Lower extremity edema: no Anxiety/depressed mood: no   ANEMIA Anemia status: Controlled Etiology of anemia: IDA Duration of anemia treatment: unsure Compliance with treatment:  did not start the iron supplement due to already having constipation. Iron supplementation side effects:  NA Severity of anemia: moderate Fatigue: yes Decreased exercise tolerance: no  Dyspnea on exertion: no Palpitations: no Bleeding: no Pica: no    Relevant past medical, surgical, family and social history reviewed and updated as indicated. Interim medical history since our last visit reviewed. Allergies and medications reviewed and updated.  Review of Systems  Constitutional:  Positive for fatigue. Negative for unexpected weight change.  Cardiovascular:  Negative for palpitations and leg swelling.  Gastrointestinal:  Positive for constipation. Negative for abdominal pain and diarrhea.  Endocrine: Negative for cold intolerance and heat intolerance.  Psychiatric/Behavioral:  Negative for dysphoric mood. The patient is not nervous/anxious.     Per HPI unless specifically indicated above     Objective:    BP 109/72   Pulse 78   Temp 98.7 F (37.1 C) (Oral)   Wt 159 lb (72.1 kg)    LMP 09/22/2021 (Approximate)   SpO2 98%   BMI 24.90 kg/m   Wt Readings from Last 3 Encounters:  06/08/23 159 lb (72.1 kg)  05/14/23 158 lb 6.4 oz (71.8 kg)  04/28/23 164 lb 1.6 oz (74.4 kg)    Physical Exam Vitals and nursing note reviewed.  Constitutional:      General: She is not in acute distress.    Appearance: Normal appearance. She is normal weight. She is not ill-appearing, toxic-appearing or diaphoretic.  HENT:     Head: Normocephalic.     Right Ear: External ear normal.     Left Ear: External ear normal.     Nose: Nose normal.     Mouth/Throat:     Mouth: Mucous membranes are moist.     Pharynx: Oropharynx is clear.  Eyes:     General:        Right eye: No discharge.        Left eye: No discharge.     Extraocular Movements: Extraocular movements intact.     Conjunctiva/sclera: Conjunctivae normal.     Pupils: Pupils are equal, round, and reactive to light.  Cardiovascular:     Rate and Rhythm: Normal rate and regular rhythm.     Heart sounds: No murmur heard. Pulmonary:     Effort: Pulmonary effort is normal. No respiratory distress.     Breath sounds: Normal breath sounds. No wheezing or rales.  Musculoskeletal:     Cervical back: Normal range of motion and neck supple.  Skin:    General: Skin is warm and dry.     Capillary Refill: Capillary refill takes  less than 2 seconds.  Neurological:     General: No focal deficit present.     Mental Status: She is alert and oriented to person, place, and time. Mental status is at baseline.  Psychiatric:        Mood and Affect: Mood normal.        Behavior: Behavior normal.        Thought Content: Thought content normal.        Judgment: Judgment normal.     Results for orders placed or performed in visit on 04/28/23  Comp Met (CMET)  Result Value Ref Range   Glucose 86 70 - 99 mg/dL   BUN 11 6 - 24 mg/dL   Creatinine, Ser 1.91 0.57 - 1.00 mg/dL   eGFR 478 >29 FA/OZH/0.86   BUN/Creatinine Ratio 16 9 - 23    Sodium 139 134 - 144 mmol/L   Potassium 4.3 3.5 - 5.2 mmol/L   Chloride 101 96 - 106 mmol/L   CO2 25 20 - 29 mmol/L   Calcium 9.2 8.7 - 10.2 mg/dL   Total Protein 6.6 6.0 - 8.5 g/dL   Albumin 4.0 3.9 - 4.9 g/dL   Globulin, Total 2.6 1.5 - 4.5 g/dL   Albumin/Globulin Ratio 1.5 1.2 - 2.2   Bilirubin Total 0.3 0.0 - 1.2 mg/dL   Alkaline Phosphatase 43 (L) 44 - 121 IU/L   AST 14 0 - 40 IU/L   ALT 15 0 - 32 IU/L  CBC w/Diff  Result Value Ref Range   WBC 6.5 3.4 - 10.8 x10E3/uL   RBC 4.05 3.77 - 5.28 x10E6/uL   Hemoglobin 11.9 11.1 - 15.9 g/dL   Hematocrit 57.8 46.9 - 46.6 %   MCV 89 79 - 97 fL   MCH 29.4 26.6 - 33.0 pg   MCHC 33.1 31.5 - 35.7 g/dL   RDW 62.9 52.8 - 41.3 %   Platelets 201 150 - 450 x10E3/uL   Neutrophils 62 Not Estab. %   Lymphs 22 Not Estab. %   Monocytes 9 Not Estab. %   Eos 5 Not Estab. %   Basos 1 Not Estab. %   Neutrophils Absolute 4.2 1.4 - 7.0 x10E3/uL   Lymphocytes Absolute 1.4 0.7 - 3.1 x10E3/uL   Monocytes Absolute 0.6 0.1 - 0.9 x10E3/uL   EOS (ABSOLUTE) 0.3 0.0 - 0.4 x10E3/uL   Basophils Absolute 0.1 0.0 - 0.2 x10E3/uL   Immature Granulocytes 1 Not Estab. %   Immature Grans (Abs) 0.0 0.0 - 0.1 x10E3/uL  Iron, TIBC and Ferritin Panel  Result Value Ref Range   Total Iron Binding Capacity 219 (L) 250 - 450 ug/dL   UIBC 244 010 - 272 ug/dL   Iron 23 (L) 27 - 536 ug/dL   Iron Saturation 11 (L) 15 - 55 %   Ferritin 89 15 - 150 ng/mL      Assessment & Plan:   Problem List Items Addressed This Visit       Endocrine   Hypothyroidism - Primary    Chronic.  Controlled.  Continue with current medication regimen of Levothyroxine .  Refills sent today.  Labs ordered today.  Return to clinic in 6 months for reevaluation.  Call sooner if concerns arise.        Relevant Orders   Comp Met (CMET)   TSH   T4, free     Other   Iron deficiency    Labs ordered at visit today.  Will make recommendations based on  lab results.        Relevant Orders    Comp Met (CMET)   CBC w/Diff     Follow up plan: Return in about 7 months (around 01/08/2024) for Physical and Fasting labs.

## 2023-06-09 LAB — COMPREHENSIVE METABOLIC PANEL
ALT: 13 IU/L (ref 0–32)
AST: 18 IU/L (ref 0–40)
Albumin: 4.2 g/dL (ref 3.9–4.9)
Alkaline Phosphatase: 44 IU/L (ref 44–121)
BUN/Creatinine Ratio: 12 (ref 9–23)
BUN: 10 mg/dL (ref 6–24)
Bilirubin Total: 0.3 mg/dL (ref 0.0–1.2)
CO2: 25 mmol/L (ref 20–29)
Calcium: 9.8 mg/dL (ref 8.7–10.2)
Chloride: 103 mmol/L (ref 96–106)
Creatinine, Ser: 0.85 mg/dL (ref 0.57–1.00)
Globulin, Total: 2.3 g/dL (ref 1.5–4.5)
Glucose: 65 mg/dL — ABNORMAL LOW (ref 70–99)
Potassium: 3.6 mmol/L (ref 3.5–5.2)
Sodium: 140 mmol/L (ref 134–144)
Total Protein: 6.5 g/dL (ref 6.0–8.5)
eGFR: 86 mL/min/{1.73_m2} (ref >59)

## 2023-06-09 LAB — CBC WITH DIFFERENTIAL/PLATELET
Basophils Absolute: 0.1 10*3/uL (ref 0.0–0.2)
Basos: 1 %
EOS (ABSOLUTE): 0.2 10*3/uL (ref 0.0–0.4)
Eos: 3 %
Hematocrit: 39.8 % (ref 34.0–46.6)
Hemoglobin: 12.5 g/dL (ref 11.1–15.9)
Immature Grans (Abs): 0 10*3/uL (ref 0.0–0.1)
Immature Granulocytes: 0 %
Lymphocytes Absolute: 1.7 10*3/uL (ref 0.7–3.1)
Lymphs: 36 %
MCH: 29.4 pg (ref 26.6–33.0)
MCHC: 31.4 g/dL — ABNORMAL LOW (ref 31.5–35.7)
MCV: 94 fL (ref 79–97)
Monocytes Absolute: 0.4 10*3/uL (ref 0.1–0.9)
Monocytes: 9 %
Neutrophils Absolute: 2.5 10*3/uL (ref 1.4–7.0)
Neutrophils: 51 %
Platelets: 155 10*3/uL (ref 150–450)
RBC: 4.25 x10E6/uL (ref 3.77–5.28)
RDW: 12.4 % (ref 11.7–15.4)
WBC: 4.8 10*3/uL (ref 3.4–10.8)

## 2023-06-09 LAB — T4, FREE: Free T4: 1.38 ng/dL (ref 0.82–1.77)

## 2023-06-09 LAB — TSH: TSH: 2.81 u[IU]/mL (ref 0.450–4.500)

## 2023-06-09 NOTE — Progress Notes (Signed)
Hi Mazella. It was nice to see you yesterday.  Your lab work looks good.  Thyroid labs are within normal range.  No concerns at this time. Continue with your current medication regimen.  Follow up as discussed.  Please let me know if you have any questions.

## 2023-07-16 ENCOUNTER — Other Ambulatory Visit: Payer: Self-pay | Admitting: Nurse Practitioner

## 2023-07-16 NOTE — Telephone Encounter (Signed)
Requested Prescriptions  Pending Prescriptions Disp Refills   levothyroxine (SYNTHROID) 75 MCG tablet [Pharmacy Med Name: LEVOTHYROXINE 75 MCG TABLET] 90 tablet 1    Sig: TAKE 1 TABLET BY MOUTH EVERY DAY     Endocrinology:  Hypothyroid Agents Passed - 07/16/2023  2:34 AM      Passed - TSH in normal range and within 360 days    TSH  Date Value Ref Range Status  06/08/2023 2.810 0.450 - 4.500 uIU/mL Final         Passed - Valid encounter within last 12 months    Recent Outpatient Visits           1 month ago Hypothyroidism, unspecified type   [facility] [name], NP   2 months ago Right middle lobe pulmonary infiltrate   [facility] [name], NP   5 months ago Acute maxillary sinusitis, recurrence not specified   [facility] Mecum, Oswaldo Conroy, PA-C   6 months ago Annual physical exam   [facility] [name], NP   9 months ago Hypothyroidism, unspecified type   [facility] [name], NP       Future Appointments             In 6 months [name], NP [facility]

## 2023-09-22 ENCOUNTER — Encounter: Payer: Self-pay | Admitting: Pediatrics

## 2023-09-22 ENCOUNTER — Ambulatory Visit: Payer: BC Managed Care – PPO | Admitting: Pediatrics

## 2023-09-22 VITALS — BP 108/77 | HR 90 | Ht 67.0 in | Wt 154.2 lb

## 2023-09-22 DIAGNOSIS — G5693 Unspecified mononeuropathy of bilateral upper limbs: Secondary | ICD-10-CM | POA: Diagnosis not present

## 2023-09-22 DIAGNOSIS — Z1330 Encounter for screening examination for mental health and behavioral disorders, unspecified: Secondary | ICD-10-CM

## 2023-09-22 NOTE — Patient Instructions (Addendum)
Scheduler will call you to get testing scheduled - if you don't hear by Friday, please send me a message on mychart  Will let you know if any issues on collected labs today  Will let you know about follow up depending on results and when we can get testing scheduled.

## 2023-09-22 NOTE — Progress Notes (Signed)
Acute Visit  BP 108/77   Pulse 90   Ht 5\' 7"  (1.702 m)   Wt 154 lb 3.2 oz (69.9 kg)   LMP 09/22/2021 (Approximate)   SpO2 98%   BMI 24.15 kg/m    Subjective:    Patient ID: Kari Acosta, female    DOB: 1976-06-12, 47 y.o.   MRN: [id number]  HPI: Kari Acosta is a 47 y.o. female  Chief Complaint  Patient presents with   Wrist Pain    Patient says she has an area in the L wrist area and under her L arm that she notices with burning and tingling. Patient says she has noticed the issue for about two weeks. Patient says she notices it slightly in her R arm as well, but not as bad as the L arm. Patient says she has tried warm compress. Patient says she notices it more at night and says the discomfort comes and goes,  but touches the area hurts.    #Wrist pain Started 2 weeks ago, no recent trauma Some back pain in thoracic region started yesterday Pain does not awaken her She has physical labor (laundry), has to stretch twice daily No neck pain, has headaches but unrelated to new symptoms Denies any headaches, vision changes, abnormal movements/seizures, or loss of consciousness. Symptoms started after sinus infection Denies fevers, chills, nausea, vomiting  Relevant past medical, surgical, family and social history reviewed and updated as indicated. Interim medical history since our last visit reviewed. Allergies and medications reviewed and updated.  ROS per HPI unless specifically indicated above     Objective:    BP 108/77   Pulse 90   Ht 5\' 7"  (1.702 m)   Wt 154 lb 3.2 oz (69.9 kg)   LMP 09/22/2021 (Approximate)   SpO2 98%   BMI 24.15 kg/m   Wt Readings from Last 3 Encounters:  09/22/23 154 lb 3.2 oz (69.9 kg)  06/08/23 159 lb (72.1 kg)  05/14/23 158 lb 6.4 oz (71.8 kg)    Physical Exam Constitutional:      Appearance: Normal appearance.  Pulmonary:     Effort: Pulmonary effort is normal.  Musculoskeletal:        General: Normal range of motion.      Cervical back: Full passive range of motion without pain. No signs of trauma or rigidity. No spinous process tenderness or muscular tenderness. Normal range of motion.  Skin:    Comments: Normal skin color  Neurological:     General: No focal deficit present.     Mental Status: She is alert. Mental status is at baseline.     Sensory: Sensory deficit present.     Motor: No weakness.     Comments: Bilateral sensory deficits on upper extremity forearms, negative tinnel and phalen at the wrist  Psychiatric:        Mood and Affect: Mood normal.        Behavior: Behavior normal.        Thought Content: Thought content normal.         09/22/2023    3:05 PM 09/22/2023    2:43 PM 06/08/2023   10:19 AM 04/28/2023    2:49 PM 01/06/2023    2:26 PM  Depression screen PHQ 2/9  Decreased Interest 0 0 0 0 0  Down, Depressed, Hopeless 0 0 0 0 0  PHQ - 2 Score 0 0 0 0 0  Altered sleeping 0 0 0 0 1  Tired, decreased  energy 0 0 1 1 1   Change in appetite 0 0 0 0 0  Feeling bad or failure about yourself  0 0 0 0 0  Trouble concentrating 0 0 0 0 1  Moving slowly or fidgety/restless 0 0 0 0 0  Suicidal thoughts 0 0 0 0 0  PHQ-9 Score 0 0 1 1 3   Difficult doing work/chores Not difficult at all Not difficult at all Not difficult at all  Not difficult at all      09/22/2023    3:10 PM 09/22/2023    2:44 PM 06/08/2023   10:19 AM 04/28/2023    2:49 PM  GAD 7 : Generalized Anxiety Score  Nervous, Anxious, on Edge 0 0 0 0  Control/stop worrying 0 0 0 0  Worry too much - different things 0 0 0 0  Trouble relaxing 0 0 0 0  Restless 0 0 0   Easily annoyed or irritable 0 0 0 0  Afraid - awful might happen 0 0 0 0  Total GAD 7 Score 0 0 0   Anxiety Difficulty Not difficult at all Not difficult at all Not difficult at all       Assessment & Plan:  Assessment & Plan   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 Exam overall reassuring, suspect benight transient nerve compression from  overuse at work. Did have a couple episodes where she dropped objects but had normal strength testing today. Plan to get EMG and baseline blood work as below.  -     NCV with EMG(electromyography) -     NCV with EMG(electromyography); Future -     CBC with Differential/Platelet -     Hemoglobin A1c -     Magnesium -     Basic metabolic panel  Encounter for behavioral health screening As part of their intake evaluation, the patient was screened for depression, anxiety.  PHQ9 SCORE 0, GAD7 SCORE 0. Screening results negative for tested conditions.     Follow up plan: Return if symptoms worsen or fail to improve.  Meer Reindl Howell Pringle, MD

## 2023-09-23 LAB — CBC WITH DIFFERENTIAL/PLATELET
Basophils Absolute: 0.1 10*3/uL (ref 0.0–0.2)
Basos: 2 %
EOS (ABSOLUTE): 0.1 10*3/uL (ref 0.0–0.4)
Eos: 2 %
Hematocrit: 40.4 % (ref 34.0–46.6)
Hemoglobin: 13.3 g/dL (ref 11.1–15.9)
Immature Grans (Abs): 0 10*3/uL (ref 0.0–0.1)
Immature Granulocytes: 0 %
Lymphocytes Absolute: 1.9 10*3/uL (ref 0.7–3.1)
Lymphs: 36 %
MCH: 29.9 pg (ref 26.6–33.0)
MCHC: 32.9 g/dL (ref 31.5–35.7)
MCV: 91 fL (ref 79–97)
Monocytes Absolute: 0.7 10*3/uL (ref 0.1–0.9)
Monocytes: 13 %
Neutrophils Absolute: 2.5 10*3/uL (ref 1.4–7.0)
Neutrophils: 47 %
Platelets: 215 10*3/uL (ref 150–450)
RBC: 4.45 x10E6/uL (ref 3.77–5.28)
RDW: 12.2 % (ref 11.7–15.4)
WBC: 5.3 10*3/uL (ref 3.4–10.8)

## 2023-09-23 LAB — BASIC METABOLIC PANEL
BUN/Creatinine Ratio: 15 (ref 9–23)
BUN: 13 mg/dL (ref 6–24)
CO2: 26 mmol/L (ref 20–29)
Calcium: 9.4 mg/dL (ref 8.7–10.2)
Chloride: 101 mmol/L (ref 96–106)
Creatinine, Ser: 0.84 mg/dL (ref 0.57–1.00)
Glucose: 85 mg/dL (ref 70–99)
Potassium: 3.9 mmol/L (ref 3.5–5.2)
Sodium: 141 mmol/L (ref 134–144)
eGFR: 87 mL/min/{1.73_m2} (ref >59)

## 2023-09-23 LAB — HEMOGLOBIN A1C
Est. average glucose Bld gHb Est-mCnc: 105 mg/dL
Hgb A1c MFr Bld: 5.3 % (ref 4.8–5.6)

## 2023-09-23 LAB — MAGNESIUM: Magnesium: 1.6 mg/dL (ref 1.6–2.3)

## 2023-10-06 ENCOUNTER — Emergency Department: Payer: BC Managed Care – PPO

## 2023-10-06 ENCOUNTER — Other Ambulatory Visit: Payer: Self-pay

## 2023-10-06 ENCOUNTER — Emergency Department
Admission: EM | Admit: 2023-10-06 | Discharge: 2023-10-06 | Disposition: A | Discharge status: Home / Self Care | Payer: BC Managed Care – PPO | Attending: Student in an Organized Health Care Education/Training Program | Admitting: Student in an Organized Health Care Education/Training Program

## 2023-10-06 DIAGNOSIS — R918 Other nonspecific abnormal finding of lung field: Secondary | ICD-10-CM | POA: Diagnosis not present

## 2023-10-06 DIAGNOSIS — R0789 Other chest pain: Secondary | ICD-10-CM | POA: Insufficient documentation

## 2023-10-06 DIAGNOSIS — R079 Chest pain, unspecified: Secondary | ICD-10-CM | POA: Diagnosis not present

## 2023-10-06 LAB — BASIC METABOLIC PANEL
Anion gap: 10 (ref 5–15)
BUN: 10 mg/dL (ref 6–20)
CO2: 25 mmol/L (ref 22–32)
Calcium: 8.7 mg/dL — ABNORMAL LOW (ref 8.9–10.3)
Chloride: 102 mmol/L (ref 98–111)
Creatinine, Ser: 0.63 mg/dL (ref 0.44–1.00)
GFR, Estimated: >60 mL/min (ref >60)
Glucose, Bld: 93 mg/dL (ref 70–99)
Potassium: 3.1 mmol/L — ABNORMAL LOW (ref 3.5–5.1)
Sodium: 137 mmol/L (ref 135–145)

## 2023-10-06 LAB — CBC
HCT: 35.9 % — ABNORMAL LOW (ref 36.0–46.0)
Hemoglobin: 11.8 g/dL — ABNORMAL LOW (ref 12.0–15.0)
MCH: 29.5 pg (ref 26.0–34.0)
MCHC: 32.9 g/dL (ref 30.0–36.0)
MCV: 89.8 fL (ref 80.0–100.0)
Platelets: 184 10*3/uL (ref 150–400)
RBC: 4 MIL/uL (ref 3.87–5.11)
RDW: 12.5 % (ref 11.5–15.5)
WBC: 11.2 10*3/uL — ABNORMAL HIGH (ref 4.0–10.5)
nRBC: 0 % (ref 0.0–0.2)

## 2023-10-06 LAB — TROPONIN I (HIGH SENSITIVITY)
Troponin I (High Sensitivity): 4 ng/L (ref <18)
Troponin I (High Sensitivity): <2 ng/L (ref <18)

## 2023-10-06 MED ORDER — OXYCODONE-ACETAMINOPHEN 5-325 MG PO TABS
1.0000 | ORAL_TABLET | Freq: Once | ORAL | Status: AC
  Administered 2023-10-06: 1 via ORAL
  Filled 2023-10-06: qty 1

## 2023-10-06 MED ORDER — DOXYCYCLINE HYCLATE 100 MG PO TABS: 100.0000 mg | ORAL_TABLET | Freq: Two times a day (BID) | ORAL | 0 refills | Status: AC

## 2023-10-06 NOTE — ED Triage Notes (Signed)
First nurse note: Pt to ED from [facility] for right sided chest pain

## 2023-10-06 NOTE — ED Provider Notes (Signed)
[facility] Provider Note    Event Date/Time   First MD Initiated Contact with Patient 10/06/23 1148     (approximate)   History   Chest Pain   HPI  Kari Acosta is a 47 y.o. female who presents to the ER for evaluation of right-sided chest pain.  Has similar symptoms several months ago was treated for pneumonia with improvement in symptoms.  Denies any cardiac history no history of PE.  Not any OCP.  No history of DVT or PE.  Not currently on any anticoagulation.  Symptoms have been persistent since Monday.     Physical Exam   Triage Vital Signs: ED Triage Vitals [10/06/23 1040]  Encounter Vitals Group     BP 111/86     Systolic BP Percentile      Diastolic BP Percentile      Pulse Rate 87     Resp 17     Temp 98.4 F (36.9 C)     Temp Source Oral     SpO2 100 %     Weight 155 lb (70.3 kg)     Height 5\' 8"  (1.727 m)     Head Circumference      Peak Flow      Pain Score 8     Pain Loc      Pain Education      Exclude from Growth Chart     Most recent vital signs: Vitals:   10/06/23 1145 10/06/23 1200  BP: 126/87 120/84  Pulse: (!) 25 76  Resp: 14 17  Temp:    SpO2:  100%     Constitutional: Alert  Eyes: Conjunctivae are normal.  Head: Atraumatic. Nose: No congestion/rhinnorhea. Mouth/Throat: Mucous membranes are moist.   Neck: Painless ROM.  Cardiovascular:   Good peripheral circulation. Respiratory: Normal respiratory effort.  No retractions.  Gastrointestinal: Soft and nontender.  Musculoskeletal:  no deformity Neurologic:  MAE spontaneously. No gross focal neurologic deficits are appreciated.  Skin:  Skin is warm, dry and intact. No rash noted. Psychiatric: Mood and affect are normal. Speech and behavior are normal.    ED Results / Procedures / Treatments   Labs (all labs ordered are listed, but only abnormal results are displayed) Labs Reviewed  CBC - Abnormal; Notable for the following components:       Result Value   WBC 11.2 (*)    Hemoglobin 11.8 (*)    HCT 35.9 (*)    All other components within normal limits  BASIC METABOLIC PANEL  TROPONIN I (HIGH SENSITIVITY)     EKG  ED ECG REPORT I, Willy Eddy, the attending physician, personally viewed and interpreted this ECG.   Date: 10/06/2023  EKG Time: 10:34  Rate: 80  Rhythm: sinus  Axis: right  Intervals: normal  ST&T Change: no stemi, no depressions    RADIOLOGY Please see ED Course for my review and interpretation.  I personally reviewed all radiographic images ordered to evaluate for the above acute complaints and reviewed radiology reports and findings.  These findings were personally discussed with the patient.  Please see medical record for radiology report.    PROCEDURES:  Critical Care performed: No  Procedures   MEDICATIONS ORDERED IN ED: Medications  oxyCODONE-acetaminophen (PERCOCET/ROXICET) 5-325 MG per tablet 1 tablet (has no administration in time range)     IMPRESSION / MDM / ASSESSMENT AND PLAN / ED COURSE  I reviewed the triage vital signs and the nursing notes.  Differential diagnosis includes, but is not limited to, ACS, pericarditis, esophagitis, boerhaaves, pe, dissection, pna, bronchitis, costochondritis  Patient presenting to the ER for evaluation of symptoms as described above.  Based on symptoms, risk factors and considered above differential, this presenting complaint could reflect a potentially life-threatening illness therefore the patient will be placed on continuous pulse oximetry and telemetry for monitoring.  Laboratory evaluation will be sent to evaluate for the above complaints.  Patient well-appearing no acute distress.  Chest x-ray on my review and interpretation without evidence of pneumothorax.  Possible mild infiltrate or opacity.  She is low risk by Wells criteria she is PERC negative.  Is not consistent with dissection.  Abdominal exam is  soft and benign.  Do not believe that this is referred from the abdomen.  She is observed in the ER for serial enzymes both which were negative.  She felt like she has had similar symptoms when she was previously treated with pneumonia and got better therefore we will cover with doxycycline.  I do not for the further diagnostic testing clinically indicated in the ER at this time and that she is appropriate for outpatient follow-up.       FINAL CLINICAL IMPRESSION(S) / ED DIAGNOSES   Final diagnoses:  Atypical chest pain     Rx / DC Orders   ED Discharge Orders     None        Note:  This document was prepared using Dragon voice recognition software and may include unintentional dictation errors.    Willy Eddy, MD 10/06/23 1339

## 2023-10-06 NOTE — ED Notes (Signed)
Pt stating having sharp pain under Right breast, stating last time this happened she had pneumonia.

## 2023-10-06 NOTE — ED Triage Notes (Signed)
Pt presents to ED with c/o of CP that started Monday. Pt states ibuprofen helped with pain. Pt denies cardiac HX. PT denies radiation of pain, pain located R sided. NAD noted.

## 2023-10-26 ENCOUNTER — Other Ambulatory Visit: Payer: Self-pay | Admitting: Nurse Practitioner

## 2023-10-26 DIAGNOSIS — N6489 Other specified disorders of breast: Secondary | ICD-10-CM

## 2023-11-10 ENCOUNTER — Ambulatory Visit: Payer: Self-pay

## 2023-11-10 ENCOUNTER — Ambulatory Visit: Payer: BC Managed Care – PPO | Admitting: Nurse Practitioner

## 2023-11-10 NOTE — Telephone Encounter (Signed)
Chief Complaint: Chest Pain  Symptoms: Moderate chest pain, dry cough, runny nose  Frequency: comes and goes  Pertinent Negatives: Patient denies SOB, fever, nausea, vomiting  Disposition: [] ED /[] Urgent Care (no appt availability in office) / [x] Appointment(In office/virtual)/ []  [facility]/ [] Home Care/ [] Refused Recommended Disposition /[] [facility]/ []  Follow-up with PCP Additional Notes: Patient states she is experiencing chest pain again and this is a reoccurring issue and each time this has happen she was diagnosed with walking pneumonia and prescribed antibiotics. Patient states the last time it happened was last month October and she was given Doxycycline and it helped clear up her symptoms but the same symptoms have returned. Care advice was given and patient has been scheduled to be seen today at 1520.  Reason for Disposition  [1] Chest pain lasts < 5 minutes AND [2] NO chest pain or cardiac symptoms (e.g., breathing difficulty, sweating) now  (Exception: Chest pains that last only a few seconds.)  Answer Assessment - Initial Assessment Questions 1. LOCATION: "Where does it hurt?"       Right side of the chest behind my breast  2. RADIATION: "Does the pain go anywhere else?" (e.g., into neck, jaw, arms, back)     No  3. ONSET: "When did the chest pain begin?" (Minutes, hours or days)      Yesterday  4. PATTERN: "Does the pain come and go, or has it been constant since it started?"  "Does it get worse with exertion?"      Comes and goes  5. DURATION: "How long does it last" (e.g., seconds, minutes, hours)     A few seconds at a time  6. SEVERITY: "How bad is the pain?"  (e.g., Scale 1-10; mild, moderate, or severe)    - MILD (1-3): doesn't interfere with normal activities     - MODERATE (4-7): interferes with normal activities or awakens from sleep    - SEVERE (8-10): excruciating pain, unable to do any normal activities       Moderate  7. CARDIAC RISK  FACTORS: "Do you have any history of heart problems or risk factors for heart disease?" (e.g., angina, prior heart attack; diabetes, high blood pressure, high cholesterol, smoker, or strong family history of heart disease)     No  8. PULMONARY RISK FACTORS: "Do you have any history of lung disease?"  (e.g., blood clots in lung, asthma, emphysema, birth control pills)     No  9. CAUSE: "What do you think is causing the chest pain?"     They keep telling me its walking pneumonia  10. OTHER SYMPTOMS: "Do you have any other symptoms?" (e.g., dizziness, nausea, vomiting, sweating, fever, difficulty breathing, cough)       Dry cough, runny nose  Protocols used: Chest Pain-A-AH

## 2023-11-15 ENCOUNTER — Ambulatory Visit: Payer: BC Managed Care – PPO

## 2023-11-15 ENCOUNTER — Other Ambulatory Visit: Payer: BC Managed Care – PPO

## 2023-11-15 ENCOUNTER — Ambulatory Visit: Payer: BC Managed Care – PPO | Admitting: Internal Medicine

## 2023-11-16 ENCOUNTER — Inpatient Hospital Stay: Payer: BC Managed Care – PPO | Attending: Internal Medicine

## 2023-11-17 ENCOUNTER — Telehealth: Payer: Self-pay | Admitting: Internal Medicine

## 2023-11-17 NOTE — Telephone Encounter (Signed)
12/2 Called patient to let her know labs needed to be done prior to appointment with Dr. Leonard Schwartz- due to insurance- voicemail full sent patient mychart message   12/4- patient did not go for labs- message from insurance that patient did not get labs. Cancelled MD/iron appointment and r/s labs for same time as appointment. Tried to call patient again- voicemail still full.

## 2023-11-19 ENCOUNTER — Other Ambulatory Visit: Payer: BC Managed Care – PPO

## 2023-11-19 ENCOUNTER — Ambulatory Visit
Admission: RE | Admit: 2023-11-19 | Discharge: 2023-11-19 | Disposition: A | Discharge status: Home / Self Care | Payer: BC Managed Care – PPO | Source: Ambulatory Visit | Attending: Nurse Practitioner | Admitting: Nurse Practitioner

## 2023-11-19 ENCOUNTER — Inpatient Hospital Stay: Payer: BC Managed Care – PPO

## 2023-11-19 ENCOUNTER — Inpatient Hospital Stay: Payer: BC Managed Care – PPO | Admitting: Internal Medicine

## 2023-11-19 DIAGNOSIS — N6489 Other specified disorders of breast: Secondary | ICD-10-CM

## 2024-01-14 ENCOUNTER — Encounter: Payer: BC Managed Care – PPO | Admitting: Nurse Practitioner

## 2024-01-14 NOTE — Progress Notes (Deleted)
 LMP 09/22/2021 (Approximate)    Subjective:    Patient ID: Kari Acosta, female    DOB: November 02, 1976, 48 y.o.   MRN: [id number]  HPI: Kari Acosta is a 48 y.o. female presenting on 01/14/2024 for comprehensive medical examination. Current medical complaints include:none  She currently lives with: Menopausal Symptoms: no  HYPOTHYROIDISM Thyroid control status:controlled Satisfied with current treatment? yes Medication side effects: no Medication compliance: excellent compliance Etiology of hypothyroidism:  Recent dose adjustment:no Fatigue: yes Cold intolerance: yes Heat intolerance: no Weight gain: no Weight loss: no Constipation: yes Diarrhea/loose stools: no Palpitations: no Lower extremity edema: no Anxiety/depressed mood: no   Depression Screen done today and results listed below:     09/22/2023    3:05 PM 09/22/2023    2:43 PM 06/08/2023   10:19 AM 04/28/2023    2:49 PM 01/06/2023    2:26 PM  Depression screen PHQ 2/9  Decreased Interest 0 0 0 0 0  Down, Depressed, Hopeless 0 0 0 0 0  PHQ - 2 Score 0 0 0 0 0  Altered sleeping 0 0 0 0 1  Tired, decreased energy 0 0 1 1 1   Change in appetite 0 0 0 0 0  Feeling bad or failure about yourself  0 0 0 0 0  Trouble concentrating 0 0 0 0 1  Moving slowly or fidgety/restless 0 0 0 0 0  Suicidal thoughts 0 0 0 0 0  PHQ-9 Score 0 0 1 1 3   Difficult doing work/chores Not difficult at all Not difficult at all Not difficult at all  Not difficult at all    The patient does not have a history of falls. I did complete a risk assessment for falls. A plan of care for falls was documented.   Past Medical History:  Past Medical History:  Diagnosis Date  . Anemia   . Complication of anesthesia    difficult to wake up after hysterectomy  . Family history of colon cancer in mother   . Family history of ovarian cancer 08/2021   cancer genetic testing letter sent  . History of kidney stones   . Hypothyroidism   . Right  nephrolithiasis 04/2022    Surgical History:  Past Surgical History:  Procedure Laterality Date  . CESAREAN SECTION    . CHOLECYSTECTOMY    . COLONOSCOPY WITH PROPOFOL N/A 02/26/2022   Procedure: COLONOSCOPY WITH PROPOFOL;  Surgeon: Midge Minium, MD;  Location: [facility] ENDOSCOPY;  Service: Endoscopy;  Laterality: N/A;  . CYSTOSCOPY  09/23/2021   Procedure: CYSTOSCOPY;  Surgeon: Nadara Mustard, MD;  Location: [facility] ORS;  Service: Gynecology;;  . CYSTOSCOPY/URETEROSCOPY/HOLMIUM LASER/STENT PLACEMENT Right 05/05/2022   Procedure: CYSTOSCOPY/URETEROSCOPY/HOLMIUM LASER/STENT EXCHANGE;  Surgeon: Riki Altes, MD;  Location: [facility] ORS;  Service: Urology;  Laterality: Right;  . CYSTOSCOPY/URETEROSCOPY/HOLMIUM LASER/STENT PLACEMENT Right 04/21/2022   Procedure: CYSTOSCOPY/URETEROSCOPY/HOLMIUM LASER/STENT PLACEMENT;  Surgeon: Riki Altes, MD;  Location: [facility] ORS;  Service: Urology;  Laterality: Right;  . LAPAROSCOPIC BILATERAL SALPINGECTOMY Bilateral 09/23/2021   Procedure: LAPAROSCOPIC BILATERAL SALPINGECTOMY;  Surgeon: Nadara Mustard, MD;  Location: [facility] ORS;  Service: Gynecology;  Laterality: Bilateral;  . LAPAROSCOPIC SUPRACERVICAL HYSTERECTOMY  09/23/2021   Procedure: LAPAROSCOPIC SUPRACERVICAL HYSTERECTOMY WITH ABLATION OF ENDOMETRIAL CELLS;  Surgeon: Nadara Mustard, MD;  Location: [facility] ORS;  Service: Gynecology;;    Medications:  Current Outpatient Medications on File Prior to Visit  Medication Sig  . levothyroxine (SYNTHROID) 75 MCG tablet TAKE 1 TABLET BY MOUTH EVERY  DAY  . [DISCONTINUED] prochlorperazine (COMPAZINE) 10 MG tablet Take 1 tablet (10 mg total) by mouth every 8 (eight) hours as needed (headache).   No current facility-administered medications on file prior to visit.    Allergies:  Allergies  Allergen Reactions  . Prednisone Swelling    Reports leg swelling last time she took prednisone.    Social History:  Social History   Socioeconomic History  . Marital  status: Significant Other    Spouse name: Not on file  . Number of children: 1  . Years of education: Not on file  . Highest education level: Not on file  Occupational History  . Not on file  Tobacco Use  . Smoking status: Never  . Smokeless tobacco: Never  Vaping Use  . Vaping status: Never Used  Substance and Sexual Activity  . Alcohol use: No  . Drug use: Never  . Sexual activity: Yes  Other Topics Concern  . 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     Social Drivers of Corporate investment banker Strain: Not on file  Food Insecurity: Not on file  Transportation Needs: Not on file  Physical Activity: Not on file  Stress: Not on file  Social Connections: Not on file  Intimate Partner Violence: Not on file   Social History   Tobacco Use  Smoking Status Never  Smokeless Tobacco Never   Social History   Substance and Sexual Activity  Alcohol Use No    Family History:  Family History  Problem Relation Age of Onset  . Colon cancer Mother 52  . Hypertension Mother   . Hyperlipidemia Father   . Hypertension Father   . Stroke Father   . Healthy Son   . Healthy Son   . Ovarian cancer Maternal Aunt   . Breast cancer Maternal Grandmother   . Cancer Maternal Grandmother        Gallbladder Cancer  . Lung cancer Maternal Grandmother   . Hypertension Maternal Grandfather   . Heart failure Paternal Grandmother   . Tuberculosis Paternal Grandfather   . Cancer - Other Maternal Great-grandmother        Gallbladder Cancer    Past medical history, surgical history, medications, allergies, family history and social history reviewed with patient today and changes made to appropriate areas of the chart.   Review of Systems  Constitutional:  Positive for malaise/fatigue. Negative for weight loss.  Cardiovascular:  Negative for palpitations and leg swelling.   Gastrointestinal:  Positive for constipation. Negative for diarrhea.  Psychiatric/Behavioral:  Negative for depression. The patient is not nervous/anxious.    All other ROS negative except what is listed above and in the HPI.      Objective:    LMP 09/22/2021 (Approximate)   Wt Readings from Last 3 Encounters:  10/06/23 155 lb (70.3 kg)  09/22/23 154 lb 3.2 oz (69.9 kg)  06/08/23 159 lb (72.1 kg)    Physical Exam Vitals and nursing note reviewed.  Constitutional:      General: She is awake. She is not in acute distress.    Appearance: Normal appearance. She is well-developed and normal weight. She is not ill-appearing.  HENT:     Head: Normocephalic and atraumatic.     Right Ear: Hearing, tympanic membrane, ear canal and external ear normal. No drainage.     Left Ear: Hearing, tympanic membrane, ear canal  and external ear normal. No drainage.     Nose: Nose normal.     Right Sinus: No maxillary sinus tenderness or frontal sinus tenderness.     Left Sinus: No maxillary sinus tenderness or frontal sinus tenderness.     Mouth/Throat:     Mouth: Mucous membranes are moist.     Pharynx: Oropharynx is clear. Uvula midline. No pharyngeal swelling, oropharyngeal exudate or posterior oropharyngeal erythema.  Eyes:     General: Lids are normal.        Right eye: No discharge.        Left eye: No discharge.     Extraocular Movements: Extraocular movements intact.     Conjunctiva/sclera: Conjunctivae normal.     Pupils: Pupils are equal, round, and reactive to light.     Visual Fields: Right eye visual fields normal and left eye visual fields normal.  Neck:     Thyroid: No thyromegaly.     Vascular: No carotid bruit.     Trachea: Trachea normal.  Cardiovascular:     Rate and Rhythm: Normal rate and regular rhythm.     Heart sounds: Normal heart sounds. No murmur heard.    No gallop.  Pulmonary:     Effort: Pulmonary effort is normal. No accessory muscle usage or respiratory  distress.     Breath sounds: Normal breath sounds.  Chest:  Breasts:    Right: Normal.     Left: Normal.  Abdominal:     General: Bowel sounds are normal.     Palpations: Abdomen is soft. There is no hepatomegaly or splenomegaly.     Tenderness: There is no abdominal tenderness.  Musculoskeletal:        General: Normal range of motion.     Cervical back: Normal range of motion and neck supple.     Right lower leg: No edema.     Left lower leg: No edema.  Lymphadenopathy:     Head:     Right side of head: No submental, submandibular, tonsillar, preauricular or posterior auricular adenopathy.     Left side of head: No submental, submandibular, tonsillar, preauricular or posterior auricular adenopathy.     Cervical: No cervical adenopathy.     Upper Body:     Right upper body: No supraclavicular, axillary or pectoral adenopathy.     Left upper body: No supraclavicular, axillary or pectoral adenopathy.  Skin:    General: Skin is warm and dry.     Capillary Refill: Capillary refill takes less than 2 seconds.     Findings: No rash.  Neurological:     Mental Status: She is alert and oriented to person, place, and time.     Gait: Gait is intact.  Psychiatric:        Attention and Perception: Attention normal.        Mood and Affect: Mood normal.        Speech: Speech normal.        Behavior: Behavior normal. Behavior is cooperative.        Thought Content: Thought content normal.        Judgment: Judgment normal.    Results for orders placed or performed during the hospital encounter of 10/06/23  Basic metabolic panel   Collection Time: 10/06/23 10:42 AM  Result Value Ref Range   Sodium 137 135 - 145 mmol/L   Potassium 3.1 (L) 3.5 - 5.1 mmol/L   Chloride 102 98 - 111 mmol/L   CO2 25 22 -  32 mmol/L   Glucose, Bld 93 70 - 99 mg/dL   BUN 10 6 - 20 mg/dL   Creatinine, Ser 1.61 0.44 - 1.00 mg/dL   Calcium 8.7 (L) 8.9 - 10.3 mg/dL   GFR, Estimated >09 >60 mL/min   Anion gap 10 5  - 15  CBC   Collection Time: 10/06/23 10:42 AM  Result Value Ref Range   WBC 11.2 (H) 4.0 - 10.5 K/uL   RBC 4.00 3.87 - 5.11 MIL/uL   Hemoglobin 11.8 (L) 12.0 - 15.0 g/dL   HCT 45.4 (L) 09.8 - 11.9 %   MCV 89.8 80.0 - 100.0 fL   MCH 29.5 26.0 - 34.0 pg   MCHC 32.9 30.0 - 36.0 g/dL   RDW 14.7 82.9 - 56.2 %   Platelets 184 150 - 400 K/uL   nRBC 0.0 0.0 - 0.2 %  Troponin I (High Sensitivity)   Collection Time: 10/06/23 10:42 AM  Result Value Ref Range   Troponin I (High Sensitivity) 4 <18 ng/L  Troponin I (High Sensitivity)   Collection Time: 10/06/23 12:43 PM  Result Value Ref Range   Troponin I (High Sensitivity) <2 <18 ng/L      Assessment & Plan:   Problem List Items Addressed This Visit       Endocrine   Hypothyroidism - Primary     Other   Iron deficiency     Follow up plan: No follow-ups on file.   LABORATORY TESTING:  - Pap smear: up to date  IMMUNIZATIONS:   - Tdap: Tetanus vaccination status reviewed: last tetanus booster within 10 years. - Influenza: Refused - Pneumovax: Not applicable - Prevnar: Not applicable - COVID: Not applicable - HPV: Not applicable - Shingrix vaccine: Not applicable  SCREENING: -Mammogram: Ordered today  - Colonoscopy: Not applicable  - Bone Density: Not applicable  -Hearing Test: Not applicable  -Spirometry: Not applicable   PATIENT COUNSELING:   Advised to take 1 mg of folate supplement per day if capable of pregnancy.   Sexuality: Discussed sexually transmitted diseases, partner selection, use of condoms, avoidance of unintended pregnancy  and contraceptive alternatives.   Advised to avoid cigarette smoking.  I discussed with the patient that most people either abstain from alcohol or drink within safe limits (<=14/week and <=4 drinks/occasion for males, <=7/weeks and <= 3 drinks/occasion for females) and that the risk for alcohol disorders and other health effects rises proportionally with the number of drinks per  week and how often a drinker exceeds daily limits.  Discussed cessation/primary prevention of drug use and availability of treatment for abuse.   Diet: Encouraged to adjust caloric intake to maintain  or achieve ideal body weight, to reduce intake of dietary saturated fat and total fat, to limit sodium intake by avoiding high sodium foods and not adding table salt, and to maintain adequate dietary potassium and calcium preferably from fresh fruits, vegetables, and low-fat dairy products.    stressed the importance of regular exercise  Injury prevention: Discussed safety belts, safety helmets, smoke detector, smoking near bedding or upholstery.   Dental health: Discussed importance of regular tooth brushing, flossing, and dental visits.    NEXT PREVENTATIVE PHYSICAL DUE IN 1 YEAR. No follow-ups on file.

## 2024-01-16 ENCOUNTER — Other Ambulatory Visit: Payer: Self-pay | Admitting: Nurse Practitioner

## 2024-01-18 NOTE — Telephone Encounter (Signed)
 Requested Prescriptions  Pending Prescriptions Disp Refills   levothyroxine  (SYNTHROID ) 75 MCG tablet [Pharmacy Med Name: LEVOTHYROXINE  75 MCG TABLET] 90 tablet 0    Sig: TAKE 1 TABLET BY MOUTH EVERY DAY     Endocrinology:  Hypothyroid Agents Passed - 01/18/2024  8:48 AM      Passed - TSH in normal range and within 360 days    TSH  Date Value Ref Range Status  06/08/2023 2.810 0.450 - 4.500 uIU/mL Final         Passed - Valid encounter within last 12 months    Recent Outpatient Visits           3 months ago Bilateral neuropathy of upper extremities   [facility] Herold Hadassah SQUIBB, MD   7 months ago Hypothyroidism, unspecified type   [facility] Melvin Pao, NP   8 months ago Right middle lobe pulmonary infiltrate   [facility] Melvin Pao, NP   11 months ago Acute maxillary sinusitis, recurrence not specified   [facility] Mecum, Rocky BRAVO, PA-C   1 year ago Annual physical exam    [facility] Melvin Pao, NP

## 2024-01-31 ENCOUNTER — Ambulatory Visit: Payer: Self-pay

## 2024-01-31 NOTE — Telephone Encounter (Signed)
Chief Complaint: Sore throat Symptoms: Cough off and on since having PNA, pain 5/10 when swallowing Frequency: onset this morning Pertinent Negatives: Patient denies other symptoms Disposition: [] ED /[] Urgent Care (no appt availability in office) / [x] Appointment(In office/virtual)/ []  [place] [facility]/ [] Home Care/ [] Refused Recommended Disposition /[] [facility]/ []  Follow-up with PCP Additional Notes: No availability with PCP until Thursday, scheduled with Dr. Evelene Croon tomorrow.   Reason for Disposition  [1] Sore throat is the only symptom AND [2] present > 48 hours  Answer Assessment - Initial Assessment Questions 1. ONSET: "When did the throat start hurting?" (Hours or days ago)      This morning a little 2. SEVERITY: "How bad is the sore throat?" (Scale 1-10; mild, moderate or severe)   - MILD (1-3):  Doesn't interfere with eating or normal activities.   - MODERATE (4-7): Interferes with eating some solids and normal activities.   - SEVERE (8-10):  Excruciating pain, interferes with most normal activities.   - SEVERE WITH DYSPHAGIA (10): Can't swallow liquids, drooling.     5 3. STREP EXPOSURE: "Has there been any exposure to strep within the past week?" If Yes, ask: "What type of contact occurred?"      No 4.  VIRAL SYMPTOMS: "Are there any symptoms of a cold, such as a runny nose, cough, hoarse voice or red eyes?"      Cough every now and then, ongoing since had pneumonia 5. FEVER: "Do you have a fever?" If Yes, ask: "What is your temperature, how was it measured, and when did it start?"     No 6. PUS ON THE TONSILS: "Is there pus on the tonsils in the back of your throat?"     It looks like white patches 7. OTHER SYMPTOMS: "Do you have any other symptoms?" (e.g., difficulty breathing, headache, rash)     No  Protocols used: Sore Throat-A-AH

## 2024-02-01 ENCOUNTER — Ambulatory Visit: Payer: BC Managed Care – PPO | Admitting: Pediatrics

## 2024-04-10 ENCOUNTER — Ambulatory Visit: Admitting: Urology

## 2024-04-10 ENCOUNTER — Encounter: Payer: Self-pay | Admitting: Urology

## 2024-04-10 VITALS — BP 136/88 | HR 89 | Ht 68.0 in | Wt 158.0 lb

## 2024-04-10 DIAGNOSIS — N1330 Unspecified hydronephrosis: Secondary | ICD-10-CM

## 2024-04-10 DIAGNOSIS — N200 Calculus of kidney: Secondary | ICD-10-CM

## 2024-04-10 NOTE — Progress Notes (Signed)
 I, Maysun Jamey Mccallum, acting as a scribe for Kari Knapp, MD., have documented all relevant documentation on the behalf of Kari Knapp, MD, as directed by Kari Knapp, MD while in the presence of Kari Knapp, MD.  04/10/2024 3:39 PM   Kari Acosta Sep 25, 1976 [id number]  Referring provider: Aileen Alexanders, NP [address] [place],  [place] [address]  Chief Complaint  Patient presents with   Nephrolithiasis   Urologic history: 1. Nephrolithiasis Status post staged ureteroscopy for impacted 8 and 12.5 mm right distal ureteral calculi, and right renal calculi 04/21/2022 and 05/05/22. CT renal stone study showed moderate-severe right hydronephrosis/renal atrophy.  HPI: Kari Acosta is a 48 y.o. female presents for a follow-up visit.  Last visit 05/14/22, patient was doing well, and a follow-up Lasix renogram was recommended which did not get scheduled.  Since she was asymptomatic she elected not call back She brings in stones today that she passed in September, November, December and January.  Last month she had a history of severe right flank pain, which resolved after 24 hours, though she was unaware of passing a stone. He has no complaints today.   PMH: Past Medical History:  Diagnosis Date   Anemia    Complication of anesthesia    difficult to wake up after hysterectomy   Family history of colon cancer in mother    Family history of ovarian cancer 08/2021   cancer genetic testing letter sent   History of kidney stones    Hypothyroidism    Right nephrolithiasis 04/2022    Surgical History: Past Surgical History:  Procedure Laterality Date   CESAREAN SECTION     CHOLECYSTECTOMY     COLONOSCOPY WITH PROPOFOL  N/A 02/26/2022   Procedure: COLONOSCOPY WITH PROPOFOL ;  Surgeon: Marnee Sink, MD;  Location: [facility] ENDOSCOPY;  Service: Endoscopy;  Laterality: N/A;   CYSTOSCOPY  09/23/2021   Procedure: CYSTOSCOPY;  Surgeon: Alben Alma, MD;  Location:  [facility] ORS;  Service: Gynecology;;   CYSTOSCOPY/URETEROSCOPY/HOLMIUM LASER/STENT PLACEMENT Right 05/05/2022   Procedure: CYSTOSCOPY/URETEROSCOPY/HOLMIUM LASER/STENT EXCHANGE;  Surgeon: Kari Knapp, MD;  Location: [facility] ORS;  Service: Urology;  Laterality: Right;   CYSTOSCOPY/URETEROSCOPY/HOLMIUM LASER/STENT PLACEMENT Right 04/21/2022   Procedure: CYSTOSCOPY/URETEROSCOPY/HOLMIUM LASER/STENT PLACEMENT;  Surgeon: Kari Knapp, MD;  Location: [facility] ORS;  Service: Urology;  Laterality: Right;   LAPAROSCOPIC BILATERAL SALPINGECTOMY Bilateral 09/23/2021   Procedure: LAPAROSCOPIC BILATERAL SALPINGECTOMY;  Surgeon: Alben Alma, MD;  Location: [facility] ORS;  Service: Gynecology;  Laterality: Bilateral;   LAPAROSCOPIC SUPRACERVICAL HYSTERECTOMY  09/23/2021   Procedure: LAPAROSCOPIC SUPRACERVICAL HYSTERECTOMY WITH ABLATION OF ENDOMETRIAL CELLS;  Surgeon: Alben Alma, MD;  Location: [facility] ORS;  Service: Gynecology;;    Home Medications:  Allergies as of 04/10/2024       Reactions   Prednisone  Swelling   Reports leg swelling last time she took prednisone .        Medication List        Accurate as of April 10, 2024  3:39 PM. If you have any questions, ask your nurse or doctor.          levothyroxine  75 MCG tablet Commonly known as: SYNTHROID  TAKE 1 TABLET BY MOUTH EVERY DAY        Allergies:  Allergies  Allergen Reactions   Prednisone  Swelling    Reports leg swelling last time she took prednisone .    Family History: Family History  Problem Relation Age of Onset   Colon cancer Mother  61   Hypertension Mother    Hyperlipidemia Father    Hypertension Father    Stroke Father    Healthy Son    Healthy Son    Ovarian cancer Maternal Aunt    Breast cancer Maternal Grandmother    Cancer Maternal Grandmother        Gallbladder Cancer   Lung cancer Maternal Grandmother    Hypertension Maternal Grandfather    Heart failure Paternal Grandmother    Tuberculosis Paternal  Grandfather    Cancer - Other Maternal Great-grandmother        Gallbladder Cancer    Social History:  reports that she has never smoked. She has never used smokeless tobacco. She reports that she does not drink alcohol and does not use drugs.   Physical Exam: BP 136/88   Pulse 89   Ht 5\' 8"  (1.727 m)   Wt 158 lb (71.7 kg)   LMP 09/22/2021 (Approximate)   BMI 24.02 kg/m   Constitutional:  Alert and oriented, No acute distress. HEENT: [place] AT Respiratory: Normal respiratory effort, no increased work of breathing. Psychiatric: Normal mood and affect.   Assessment & Plan:    1. Recurrent nephrolithiasis Calculi she brought in today were sent for analysis Schedule follow-up CT renal stone study. She will be notified with the results and further recommendations at that time.   I have reviewed the above documentation for accuracy and completeness, and I agree with the above.   Kari Knapp, MD  [facility] [address] [place], [place] [address] [phone]

## 2024-04-10 NOTE — Patient Instructions (Signed)
 [phone]

## 2024-04-12 ENCOUNTER — Ambulatory Visit
Admission: RE | Admit: 2024-04-12 | Discharge: 2024-04-12 | Disposition: A | Discharge status: Home / Self Care | Source: Ambulatory Visit | Attending: Urology | Admitting: Urology

## 2024-04-12 DIAGNOSIS — N200 Calculus of kidney: Secondary | ICD-10-CM | POA: Diagnosis not present

## 2024-04-12 DIAGNOSIS — Z9049 Acquired absence of other specified parts of digestive tract: Secondary | ICD-10-CM | POA: Diagnosis not present

## 2024-04-18 ENCOUNTER — Other Ambulatory Visit: Payer: Self-pay | Admitting: Nurse Practitioner

## 2024-04-18 LAB — CALCULI, WITH PHOTOGRAPH (CLINICAL LAB)
Calcium Oxalate Dihydrate: 20 %
Calcium Oxalate Monohydrate: 40 %
Hydroxyapatite: 40 %
Weight Calculi: 242 mg

## 2024-04-19 ENCOUNTER — Encounter (HOSPITAL_COMMUNITY): Payer: Self-pay

## 2024-04-19 NOTE — Telephone Encounter (Signed)
 Requested Prescriptions  Pending Prescriptions Disp Refills   levothyroxine  (SYNTHROID ) 75 MCG tablet [Pharmacy Med Name: LEVOTHYROXINE  75 MCG TABLET] 90 tablet 0    Sig: TAKE 1 TABLET BY MOUTH EVERY DAY     Endocrinology:  Hypothyroid Agents Failed - 04/19/2024  1:59 PM      Failed - Valid encounter within last 12 months    Recent Outpatient Visits   None            Passed - TSH in normal range and within 360 days    TSH  Date Value Ref Range Status  06/08/2023 2.810 0.450 - 4.500 uIU/mL Final

## 2024-04-21 ENCOUNTER — Encounter: Payer: Self-pay | Admitting: Urology

## 2024-04-24 ENCOUNTER — Ambulatory Visit: Admitting: Urology

## 2024-04-26 ENCOUNTER — Encounter: Payer: Self-pay | Admitting: Nurse Practitioner

## 2024-04-26 ENCOUNTER — Ambulatory Visit (INDEPENDENT_AMBULATORY_CARE_PROVIDER_SITE_OTHER): Admitting: Nurse Practitioner

## 2024-04-26 VITALS — BP 117/80 | HR 90 | Temp 98.2°F | Resp 16 | Ht 67.99 in | Wt 155.0 lb

## 2024-04-26 DIAGNOSIS — E611 Iron deficiency: Secondary | ICD-10-CM

## 2024-04-26 DIAGNOSIS — Z0000 Encounter for general adult medical examination without abnormal findings: Secondary | ICD-10-CM | POA: Diagnosis not present

## 2024-04-26 DIAGNOSIS — Z136 Encounter for screening for cardiovascular disorders: Secondary | ICD-10-CM | POA: Diagnosis not present

## 2024-04-26 DIAGNOSIS — E039 Hypothyroidism, unspecified: Secondary | ICD-10-CM | POA: Diagnosis not present

## 2024-04-26 DIAGNOSIS — Z1330 Encounter for screening examination for mental health and behavioral disorders, unspecified: Secondary | ICD-10-CM

## 2024-04-26 NOTE — Assessment & Plan Note (Signed)
 Labs ordered at visit today.  Will make recommendations based on lab results.

## 2024-04-26 NOTE — Assessment & Plan Note (Signed)
 Chronic.  Controlled.  Continue with current medication regimen of Levothyroxine .  Labs ordered today.  Return to clinic in 6 months for reevaluation.  Call sooner if concerns arise.

## 2024-04-26 NOTE — Progress Notes (Unsigned)
 BP 117/80 (BP Location: Left Arm, Patient Position: Sitting, Cuff Size: Normal)   Pulse 90   Temp 98.2 F (36.8 C) (Oral)   Resp 16   Ht 5' 7.99" (1.727 m)   Wt 155 lb (70.3 kg)   LMP 09/22/2021 (Approximate)   SpO2 98%   BMI 23.57 kg/m    Subjective:    Patient ID: Kari Acosta, female    DOB: 1976/02/28, 48 y.o.   MRN: [id number]  HPI: Kari Acosta is a 48 y.o. female presenting on 04/26/2024 for comprehensive medical examination. Current medical complaints include:none  She currently lives with: Menopausal Symptoms: no  HYPOTHYROIDISM Thyroid  control status:controlled Satisfied with current treatment? yes Medication side effects: no Medication compliance: excellent compliance Etiology of hypothyroidism:  Recent dose adjustment:no Fatigue: yes Cold intolerance: yes Heat intolerance: no Weight gain: no Weight loss: no Constipation: yes Diarrhea/loose stools: no Palpitations: no Lower extremity edema: no Anxiety/depressed mood: no   Depression Screen done today and results listed below:     04/26/2024    4:17 PM 09/22/2023    3:05 PM 09/22/2023    2:43 PM 06/08/2023   10:19 AM 04/28/2023    2:49 PM  Depression screen PHQ 2/9  Decreased Interest 0 0 0 0 0  Down, Depressed, Hopeless 0 0 0 0 0  PHQ - 2 Score 0 0 0 0 0  Altered sleeping  0 0 0 0  Tired, decreased energy  0 0 1 1  Change in appetite  0 0 0 0  Feeling bad or failure about yourself   0 0 0 0  Trouble concentrating  0 0 0 0  Moving slowly or fidgety/restless  0 0 0 0  Suicidal thoughts  0 0 0 0  PHQ-9 Score  0 0 1 1  Difficult doing work/chores  Not difficult at all Not difficult at all Not difficult at all     The patient does not have a history of falls. I did complete a risk assessment for falls. A plan of care for falls was documented.   Past Medical History:  Past Medical History:  Diagnosis Date   Anemia    Complication of anesthesia    difficult to wake up after hysterectomy    Family history of colon cancer in mother    Family history of ovarian cancer 08/2021   cancer genetic testing letter sent   History of kidney stones    Hypothyroidism    Right nephrolithiasis 04/2022    Surgical History:  Past Surgical History:  Procedure Laterality Date   CESAREAN SECTION     CHOLECYSTECTOMY     COLONOSCOPY WITH PROPOFOL  N/A 02/26/2022   Procedure: COLONOSCOPY WITH PROPOFOL ;  Surgeon: Marnee Sink, MD;  Location: [facility];  Service: Endoscopy;  Laterality: N/A;   CYSTOSCOPY  09/23/2021   Procedure: CYSTOSCOPY;  Surgeon: Alben Alma, MD;  Location: [facility];  Service: Gynecology;;   CYSTOSCOPY/URETEROSCOPY/HOLMIUM LASER/STENT PLACEMENT Right 05/05/2022   Procedure: CYSTOSCOPY/URETEROSCOPY/HOLMIUM LASER/STENT EXCHANGE;  Surgeon: Geraline Knapp, MD;  Location: [facility];  Service: Urology;  Laterality: Right;   CYSTOSCOPY/URETEROSCOPY/HOLMIUM LASER/STENT PLACEMENT Right 04/21/2022   Procedure: CYSTOSCOPY/URETEROSCOPY/HOLMIUM LASER/STENT PLACEMENT;  Surgeon: Geraline Knapp, MD;  Location: [facility];  Service: Urology;  Laterality: Right;   LAPAROSCOPIC BILATERAL SALPINGECTOMY Bilateral 09/23/2021   Procedure: LAPAROSCOPIC BILATERAL SALPINGECTOMY;  Surgeon: Alben Alma, MD;  Location: [facility];  Service: Gynecology;  Laterality: Bilateral;   LAPAROSCOPIC SUPRACERVICAL HYSTERECTOMY  09/23/2021   Procedure:  LAPAROSCOPIC SUPRACERVICAL HYSTERECTOMY WITH ABLATION OF ENDOMETRIAL CELLS;  Surgeon: Alben Alma, MD;  Location: [facility];  Service: Gynecology;;    Medications:  Current Outpatient Medications on File Prior to Visit  Medication Sig   levothyroxine  (SYNTHROID ) 75 MCG tablet TAKE 1 TABLET BY MOUTH EVERY DAY   [DISCONTINUED] prochlorperazine  (COMPAZINE ) 10 MG tablet Take 1 tablet (10 mg total) by mouth every 8 (eight) hours as needed (headache).   No current facility-administered medications on file prior to visit.    Allergies:  Allergies   Allergen Reactions   Prednisone  Swelling    Reports leg swelling last time she took prednisone .    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     Social Drivers of Corporate investment banker Strain: Low Risk  (04/26/2024)   Overall Financial Resource Strain (CARDIA)    Difficulty of Paying Living Expenses: Not very hard  Food Insecurity: No Food Insecurity (04/26/2024)   Hunger Vital Sign    Worried About Running Out of Food in the Last Year: Never true    Ran Out of Food in the Last Year: Never true  Transportation Needs: No Transportation Needs (04/26/2024)   PRAPARE - Administrator, Civil Service (Medical): No    Lack of Transportation (Non-Medical): No  Physical Activity: Insufficiently Active (04/26/2024)   Exercise Vital Sign    Days of Exercise per Week: 3 days    Minutes of Exercise per Session: 20 min  Stress: No Stress Concern Present (04/26/2024)   [employer] of Occupational Health - Occupational Stress Questionnaire    Feeling of Stress : Not at all  Social Connections: Unknown (04/26/2024)   Social Connection and Isolation Panel [NHANES]    Frequency of Communication with Friends and Family: Three times a week    Frequency of Social Gatherings with Friends and Family: Twice a week    Attends Religious Services: Never    Database administrator or Organizations: No     Attends Banker Meetings: Never    Marital Status: Patient unable to answer  Intimate Partner Violence: Not At Risk (04/26/2024)   Humiliation, Afraid, Rape, and Kick questionnaire    Fear of Current or Ex-Partner: No    Emotionally Abused: No    Physically Abused: No    Sexually Abused: No   Social History   Tobacco Use  Smoking Status Never  Smokeless Tobacco Never   Social History   Substance and Sexual Activity  Alcohol Use No    Family History:  Family History  Problem Relation Age of Onset   Colon cancer Mother 40   Hypertension Mother    Hyperlipidemia Father    Hypertension Father    Stroke Father    Healthy Son    Healthy Son  Ovarian cancer Maternal Aunt    Breast cancer Maternal Grandmother    Cancer Maternal Grandmother        Gallbladder Cancer   Lung cancer Maternal Grandmother    Hypertension Maternal Grandfather    Heart failure Paternal Grandmother    Tuberculosis Paternal Grandfather    Cancer - Other Maternal Great-grandmother        Gallbladder Cancer    Past medical history, surgical history, medications, allergies, family history and social history reviewed with patient today and changes made to appropriate areas of the chart.   Review of Systems  Constitutional:  Positive for malaise/fatigue. Negative for weight loss.  Cardiovascular:  Negative for palpitations and leg swelling.  Gastrointestinal:  Positive for constipation. Negative for diarrhea.  Psychiatric/Behavioral:  Negative for depression. The patient is not nervous/anxious.    All other ROS negative except what is listed above and in the HPI.      Objective:    BP 117/80 (BP Location: Left Arm, Patient Position: Sitting, Cuff Size: Normal)   Pulse 90   Temp 98.2 F (36.8 C) (Oral)   Resp 16   Ht 5' 7.99" (1.727 m)   Wt 155 lb (70.3 kg)   LMP 09/22/2021 (Approximate)   SpO2 98%   BMI 23.57 kg/m   Wt Readings from Last 3 Encounters:  04/26/24 155 lb (70.3  kg)  04/10/24 158 lb (71.7 kg)  10/06/23 155 lb (70.3 kg)    Physical Exam Vitals and nursing note reviewed.  Constitutional:      General: She is awake. She is not in acute distress.    Appearance: Normal appearance. She is well-developed and normal weight. She is not ill-appearing.  HENT:     Head: Normocephalic and atraumatic.     Right Ear: Hearing, tympanic membrane, ear canal and external ear normal. No drainage.     Left Ear: Hearing, tympanic membrane, ear canal and external ear normal. No drainage.     Nose: Nose normal.     Right Sinus: No maxillary sinus tenderness or frontal sinus tenderness.     Left Sinus: No maxillary sinus tenderness or frontal sinus tenderness.     Mouth/Throat:     Mouth: Mucous membranes are moist.     Pharynx: Oropharynx is clear. Uvula midline. No pharyngeal swelling, oropharyngeal exudate or posterior oropharyngeal erythema.  Eyes:     General: Lids are normal.        Right eye: No discharge.        Left eye: No discharge.     Extraocular Movements: Extraocular movements intact.     Conjunctiva/sclera: Conjunctivae normal.     Pupils: Pupils are equal, round, and reactive to light.     Visual Fields: Right eye visual fields normal and left eye visual fields normal.  Neck:     Thyroid : No thyromegaly.     Vascular: No carotid bruit.     Trachea: Trachea normal.  Cardiovascular:     Rate and Rhythm: Normal rate and regular rhythm.     Heart sounds: Normal heart sounds. No murmur heard.    No gallop.  Pulmonary:     Effort: Pulmonary effort is normal. No accessory muscle usage or respiratory distress.     Breath sounds: Normal breath sounds.  Chest:  Breasts:    Right: Normal.     Left: Normal.  Abdominal:     General: Bowel sounds are normal.     Palpations: Abdomen is soft. There is no  hepatomegaly or splenomegaly.     Tenderness: There is no abdominal tenderness.  Musculoskeletal:        General: Normal range of motion.      Cervical back: Normal range of motion and neck supple.     Right lower leg: No edema.     Left lower leg: No edema.  Lymphadenopathy:     Head:     Right side of head: No submental, submandibular, tonsillar, preauricular or posterior auricular adenopathy.     Left side of head: No submental, submandibular, tonsillar, preauricular or posterior auricular adenopathy.     Cervical: No cervical adenopathy.     Upper Body:     Right upper body: No supraclavicular, axillary or pectoral adenopathy.     Left upper body: No supraclavicular, axillary or pectoral adenopathy.  Skin:    General: Skin is warm and dry.     Capillary Refill: Capillary refill takes less than 2 seconds.     Findings: No rash.  Neurological:     Mental Status: She is alert and oriented to person, place, and time.     Gait: Gait is intact.  Psychiatric:        Attention and Perception: Attention normal.        Mood and Affect: Mood normal.        Speech: Speech normal.        Behavior: Behavior normal. Behavior is cooperative.        Thought Content: Thought content normal.        Judgment: Judgment normal.     Results for orders placed or performed in visit on 04/26/24  CBC with Differential/Platelet   Collection Time: 04/26/24  4:12 PM  Result Value Ref Range   WBC 6.4 3.4 - 10.8 x10E3/uL   RBC 4.56 3.77 - 5.28 x10E6/uL   Hemoglobin 13.4 11.1 - 15.9 g/dL   Hematocrit 16.1 09.6 - 46.6 %   MCV 90 79 - 97 fL   MCH 29.4 26.6 - 33.0 pg   MCHC 32.7 31.5 - 35.7 g/dL   RDW 04.5 40.9 - 81.1 %   Platelets 157 150 - 450 x10E3/uL   Neutrophils 61 Not Estab. %   Lymphs 28 Not Estab. %   Monocytes 8 Not Estab. %   Eos 2 Not Estab. %   Basos 1 Not Estab. %   Neutrophils Absolute 3.9 1.4 - 7.0 x10E3/uL   Lymphocytes Absolute 1.8 0.7 - 3.1 x10E3/uL   Monocytes Absolute 0.5 0.1 - 0.9 x10E3/uL   EOS (ABSOLUTE) 0.2 0.0 - 0.4 x10E3/uL   Basophils Absolute 0.1 0.0 - 0.2 x10E3/uL   Immature Granulocytes 0 Not Estab. %    Immature Grans (Abs) 0.0 0.0 - 0.1 x10E3/uL  Comprehensive metabolic panel with GFR   Collection Time: 04/26/24  4:12 PM  Result Value Ref Range   Glucose 71 70 - 99 mg/dL   BUN 10 6 - 24 mg/dL   Creatinine, Ser 9.14 0.57 - 1.00 mg/dL   eGFR 84 >78 GN/FAO/1.30   BUN/Creatinine Ratio 12 9 - 23   Sodium 139 134 - 144 mmol/L   Potassium 4.1 3.5 - 5.2 mmol/L   Chloride 102 96 - 106 mmol/L   CO2 22 20 - 29 mmol/L   Calcium 9.5 8.7 - 10.2 mg/dL   Total Protein 7.0 6.0 - 8.5 g/dL   Albumin 4.4 3.9 - 4.9 g/dL   Globulin, Total 2.6 1.5 - 4.5 g/dL   Bilirubin Total 0.4 0.0 -  1.2 mg/dL   Alkaline Phosphatase 48 44 - 121 IU/L   AST 16 0 - 40 IU/L   ALT 9 0 - 32 IU/L  Lipid panel   Collection Time: 04/26/24  4:12 PM  Result Value Ref Range   Cholesterol, Total 157 100 - 199 mg/dL   Triglycerides 54 0 - 149 mg/dL   HDL 71 >40 mg/dL   VLDL Cholesterol Cal 11 5 - 40 mg/dL   LDL Chol Calc (NIH) 75 0 - 99 mg/dL   Chol/HDL Ratio 2.2 0.0 - 4.4 ratio  TSH   Collection Time: 04/26/24  4:12 PM  Result Value Ref Range   TSH 3.300 0.450 - 4.500 uIU/mL  T4   Collection Time: 04/26/24  4:12 PM  Result Value Ref Range   T4, Total 8.7 4.5 - 12.0 ug/dL      Assessment & Plan:   Problem List Items Addressed This Visit       Endocrine   Hypothyroidism   Chronic.  Controlled.  Continue with current medication regimen of Levothyroxine  75mcg.  Labs ordered today.  Return to clinic in 6 months for reevaluation.  Call sooner if concerns arise.         Other   Iron  deficiency   Labs ordered at visit today.  Will make recommendations based on lab results.        Other Visit Diagnoses       Annual physical exam    -  Primary   Health maintenance reviewed during visit today.  Labs ordered.  Vaccines reviewed.  PAP up to date.  Colonoscopy up to date. Mammogram up to date.   Relevant Orders   CBC with Differential/Platelet (Completed)   Comprehensive metabolic panel with GFR (Completed)    Lipid panel (Completed)   TSH (Completed)   T4 (Completed)     Screening for ischemic heart disease       Relevant Orders   Lipid panel (Completed)     Encounter for behavioral health screening            Follow up plan: Return in about 6 months (around 10/27/2024) for HTN, HLD, DM2 FU.   LABORATORY TESTING:  - Pap smear: up to date  IMMUNIZATIONS:   - Tdap: Tetanus vaccination status reviewed: last tetanus booster within 10 years. - Influenza: Refused - Pneumovax: Not applicable - Prevnar: Not applicable - COVID: Not applicable - HPV: Not applicable - Shingrix vaccine: Not applicable  SCREENING: -Mammogram: Ordered today  - Colonoscopy: Not applicable  - Bone Density: Not applicable  -Hearing Test: Not applicable  -Spirometry: Not applicable   PATIENT COUNSELING:   Advised to take 1 mg of folate supplement per day if capable of pregnancy.   Sexuality: Discussed sexually transmitted diseases, partner selection, use of condoms, avoidance of unintended pregnancy  and contraceptive alternatives.   Advised to avoid cigarette smoking.  I discussed with the patient that most people either abstain from alcohol or drink within safe limits (<=14/week and <=4 drinks/occasion for males, <=7/weeks and <= 3 drinks/occasion for females) and that the risk for alcohol disorders and other health effects rises proportionally with the number of drinks per week and how often a drinker exceeds daily limits.  Discussed cessation/primary prevention of drug use and availability of treatment for abuse.   Diet: Encouraged to adjust caloric intake to maintain  or achieve ideal body weight, to reduce intake of dietary saturated fat and total fat, to limit sodium intake by avoiding high  sodium foods and not adding table salt, and to maintain adequate dietary potassium and calcium preferably from fresh fruits, vegetables, and low-fat dairy products.    stressed the importance of regular  exercise  Injury prevention: Discussed safety belts, safety helmets, smoke detector, smoking near bedding or upholstery.   Dental health: Discussed importance of regular tooth brushing, flossing, and dental visits.    NEXT PREVENTATIVE PHYSICAL DUE IN 1 YEAR. Return in about 6 months (around 10/27/2024) for HTN, HLD, DM2 FU.

## 2024-04-27 ENCOUNTER — Ambulatory Visit: Payer: Self-pay | Admitting: Nurse Practitioner

## 2024-04-27 LAB — CBC WITH DIFFERENTIAL/PLATELET
Basophils Absolute: 0.1 10*3/uL (ref 0.0–0.2)
Basos: 1 %
EOS (ABSOLUTE): 0.2 10*3/uL (ref 0.0–0.4)
Eos: 2 %
Hematocrit: 41 % (ref 34.0–46.6)
Hemoglobin: 13.4 g/dL (ref 11.1–15.9)
Immature Grans (Abs): 0 10*3/uL (ref 0.0–0.1)
Immature Granulocytes: 0 %
Lymphocytes Absolute: 1.8 10*3/uL (ref 0.7–3.1)
Lymphs: 28 %
MCH: 29.4 pg (ref 26.6–33.0)
MCHC: 32.7 g/dL (ref 31.5–35.7)
MCV: 90 fL (ref 79–97)
Monocytes Absolute: 0.5 10*3/uL (ref 0.1–0.9)
Monocytes: 8 %
Neutrophils Absolute: 3.9 10*3/uL (ref 1.4–7.0)
Neutrophils: 61 %
Platelets: 157 10*3/uL (ref 150–450)
RBC: 4.56 x10E6/uL (ref 3.77–5.28)
RDW: 11.9 % (ref 11.7–15.4)
WBC: 6.4 10*3/uL (ref 3.4–10.8)

## 2024-04-27 LAB — COMPREHENSIVE METABOLIC PANEL WITH GFR
ALT: 9 IU/L (ref 0–32)
AST: 16 IU/L (ref 0–40)
Albumin: 4.4 g/dL (ref 3.9–4.9)
Alkaline Phosphatase: 48 IU/L (ref 44–121)
BUN/Creatinine Ratio: 12 (ref 9–23)
BUN: 10 mg/dL (ref 6–24)
Bilirubin Total: 0.4 mg/dL (ref 0.0–1.2)
CO2: 22 mmol/L (ref 20–29)
Calcium: 9.5 mg/dL (ref 8.7–10.2)
Chloride: 102 mmol/L (ref 96–106)
Creatinine, Ser: 0.86 mg/dL (ref 0.57–1.00)
Globulin, Total: 2.6 g/dL (ref 1.5–4.5)
Glucose: 71 mg/dL (ref 70–99)
Potassium: 4.1 mmol/L (ref 3.5–5.2)
Sodium: 139 mmol/L (ref 134–144)
Total Protein: 7 g/dL (ref 6.0–8.5)
eGFR: 84 mL/min/{1.73_m2} (ref >59)

## 2024-04-27 LAB — LIPID PANEL
Chol/HDL Ratio: 2.2 ratio (ref 0.0–4.4)
Cholesterol, Total: 157 mg/dL (ref 100–199)
HDL: 71 mg/dL (ref >39)
LDL Chol Calc (NIH): 75 mg/dL (ref 0–99)
Triglycerides: 54 mg/dL (ref 0–149)
VLDL Cholesterol Cal: 11 mg/dL (ref 5–40)

## 2024-04-27 LAB — TSH: TSH: 3.3 u[IU]/mL (ref 0.450–4.500)

## 2024-04-27 LAB — T4: T4, Total: 8.7 ug/dL (ref 4.5–12.0)

## 2024-07-16 ENCOUNTER — Other Ambulatory Visit: Payer: Self-pay | Admitting: Nurse Practitioner

## 2024-07-18 NOTE — Telephone Encounter (Signed)
 Requested Prescriptions  Pending Prescriptions Disp Refills   levothyroxine  (SYNTHROID ) 75 MCG tablet [Pharmacy Med Name: LEVOTHYROXINE  75 MCG TABLET] 90 tablet 2    Sig: TAKE 1 TABLET BY MOUTH EVERY DAY     Endocrinology:  Hypothyroid Agents Passed - 07/18/2024  9:29 AM      Passed - TSH in normal range and within 360 days    TSH  Date Value Ref Range Status  04/26/2024 3.300 0.450 - 4.500 uIU/mL Final         Passed - Valid encounter within last 12 months    Recent Outpatient Visits           2 months ago Annual physical exam   [facility] Melvin Pao, NP

## 2024-10-30 ENCOUNTER — Ambulatory Visit: Admitting: Nurse Practitioner

## 2024-10-30 ENCOUNTER — Encounter: Payer: Self-pay | Admitting: Nurse Practitioner

## 2024-10-30 VITALS — BP 135/92 | HR 88 | Temp 98.5°F | Ht 67.99 in | Wt 161.2 lb

## 2024-10-30 DIAGNOSIS — E039 Hypothyroidism, unspecified: Secondary | ICD-10-CM

## 2024-10-30 NOTE — Assessment & Plan Note (Addendum)
 Chronic.  Controlled.  Continue with current medication regimen of Levothyroxine  75mcg.  Labs ordered.  Return to clinic in 6 months for reevaluation and physical.  Call sooner if concerns arise.

## 2024-10-30 NOTE — Progress Notes (Signed)
 BP (!) 135/92 (BP Location: Right Arm, Patient Position: Sitting, Cuff Size: Normal)   Pulse 88   Temp 98.5 F (36.9 C) (Oral)   Ht 5' 7.99 (1.727 m)   Wt 73.1 kg   LMP 09/22/2021 (Approximate)   SpO2 99%   BMI 24.52 kg/m    Subjective:    Patient ID: Kari Acosta, female    DOB: 12/15/1975, 48 y.o.   MRN: [id number]   Chief Complaint  Patient presents with   Anemia    Patient states she's been extremely tired and cold. She's been feeling it for about a month.   NOTE WRITTEN BY DNP STUDENT.  ASSESSMENT AND PLAN OF CARE REVIEWED WITH STUDENT, AGREE WITH ABOVE FINDINGS AND PLAN.  HPI: Kari Acosta is a 48 y.o. female here for a follow-up for hypothyroidism. Pt endorses fatigue, cold intolerance, constipation, and decreased sexual drive. Pt denies heat intolerance, weight changes, chest pain, palpitations, or lightheadedness.   HYPOTHYROIDISM Thyroid  control status:controlled Satisfied with current treatment? yes Medication side effects: no Medication compliance: excellent compliance Etiology of hypothyroidism:  Recent dose adjustment:no Fatigue: yes Cold intolerance: yes Heat intolerance: no Weight gain: no Weight loss: no Constipation: yes Diarrhea/loose stools: no Palpitations: no Lower extremity edema: no Anxiety/depressed mood: no   Relevant past medical, surgical, family and social history reviewed and updated as indicated. Interim medical history since our last visit reviewed. Allergies and medications reviewed and updated.  Review of Systems  Constitutional:  Positive for fatigue. Negative for appetite change, chills and unexpected weight change.  Respiratory:  Negative for chest tightness and shortness of breath.   Cardiovascular:  Negative for chest pain and palpitations.  Gastrointestinal:  Positive for constipation. Negative for abdominal pain and nausea.  Endocrine: Positive for cold intolerance. Negative for heat intolerance.   Musculoskeletal:  Negative for arthralgias and myalgias.  Neurological:  Negative for weakness and light-headedness.    Per HPI unless specifically indicated above     Objective:    BP (!) 135/92 (BP Location: Right Arm, Patient Position: Sitting, Cuff Size: Normal)   Pulse 88   Temp 98.5 F (36.9 C) (Oral)   Ht 5' 7.99 (1.727 m)   Wt 73.1 kg   LMP 09/22/2021 (Approximate)   SpO2 99%   BMI 24.52 kg/m   Wt Readings from Last 3 Encounters:  10/30/24 73.1 kg  04/26/24 70.3 kg  04/10/24 71.7 kg    Physical Exam Vitals and nursing note reviewed.  Constitutional:      General: She is not in acute distress.    Appearance: She is not ill-appearing, toxic-appearing or diaphoretic.  HENT:     Head: Normocephalic.     Right Ear: External ear normal.     Left Ear: External ear normal.     Nose: Nose normal.     Mouth/Throat:     Mouth: Mucous membranes are moist.     Pharynx: No oropharyngeal exudate or posterior oropharyngeal erythema.  Eyes:     Extraocular Movements: Extraocular movements intact.     Conjunctiva/sclera: Conjunctivae normal.     Pupils: Pupils are equal, round, and reactive to light.  Cardiovascular:     Rate and Rhythm: Regular rhythm. Tachycardia present.     Pulses: Normal pulses.     Heart sounds: Normal heart sounds. No murmur heard. Pulmonary:     Effort: Pulmonary effort is normal. No respiratory distress.     Breath sounds: Normal breath sounds. No wheezing, rhonchi or rales.  Abdominal:     General: Abdomen is flat. Bowel sounds are normal.     Palpations: Abdomen is soft.  Musculoskeletal:     Cervical back: Normal range of motion and neck supple.  Skin:    General: Skin is warm and dry.     Capillary Refill: Capillary refill takes less than 2 seconds.  Neurological:     General: No focal deficit present.     Mental Status: She is oriented to person, place, and time.  Psychiatric:        Mood and Affect: Mood normal.        Behavior:  Behavior normal.        Thought Content: Thought content normal.        Judgment: Judgment normal.     Results for orders placed or performed in visit on 04/26/24  CBC with Differential/Platelet   Collection Time: 04/26/24  4:12 PM  Result Value Ref Range   WBC 6.4 3.4 - 10.8 x10E3/uL   RBC 4.56 3.77 - 5.28 x10E6/uL   Hemoglobin 13.4 11.1 - 15.9 g/dL   Hematocrit 58.9 65.9 - 46.6 %   MCV 90 79 - 97 fL   MCH 29.4 26.6 - 33.0 pg   MCHC 32.7 31.5 - 35.7 g/dL   RDW 88.0 88.2 - 84.5 %   Platelets 157 150 - 450 x10E3/uL   Neutrophils 61 Not Estab. %   Lymphs 28 Not Estab. %   Monocytes 8 Not Estab. %   Eos 2 Not Estab. %   Basos 1 Not Estab. %   Neutrophils Absolute 3.9 1.4 - 7.0 x10E3/uL   Lymphocytes Absolute 1.8 0.7 - 3.1 x10E3/uL   Monocytes Absolute 0.5 0.1 - 0.9 x10E3/uL   EOS (ABSOLUTE) 0.2 0.0 - 0.4 x10E3/uL   Basophils Absolute 0.1 0.0 - 0.2 x10E3/uL   Immature Granulocytes 0 Not Estab. %   Immature Grans (Abs) 0.0 0.0 - 0.1 x10E3/uL  Comprehensive metabolic panel with GFR   Collection Time: 04/26/24  4:12 PM  Result Value Ref Range   Glucose 71 70 - 99 mg/dL   BUN 10 6 - 24 mg/dL   Creatinine, Ser 9.13 0.57 - 1.00 mg/dL   eGFR 84 >40 fO/fpw/8.26   BUN/Creatinine Ratio 12 9 - 23   Sodium 139 134 - 144 mmol/L   Potassium 4.1 3.5 - 5.2 mmol/L   Chloride 102 96 - 106 mmol/L   CO2 22 20 - 29 mmol/L   Calcium 9.5 8.7 - 10.2 mg/dL   Total Protein 7.0 6.0 - 8.5 g/dL   Albumin 4.4 3.9 - 4.9 g/dL   Globulin, Total 2.6 1.5 - 4.5 g/dL   Bilirubin Total 0.4 0.0 - 1.2 mg/dL   Alkaline Phosphatase 48 44 - 121 IU/L   AST 16 0 - 40 IU/L   ALT 9 0 - 32 IU/L  Lipid panel   Collection Time: 04/26/24  4:12 PM  Result Value Ref Range   Cholesterol, Total 157 100 - 199 mg/dL   Triglycerides 54 0 - 149 mg/dL   HDL 71 >60 mg/dL   VLDL Cholesterol Cal 11 5 - 40 mg/dL   LDL Chol Calc (NIH) 75 0 - 99 mg/dL   Chol/HDL Ratio 2.2 0.0 - 4.4 ratio  TSH   Collection Time: 04/26/24   4:12 PM  Result Value Ref Range   TSH 3.300 0.450 - 4.500 uIU/mL  T4   Collection Time: 04/26/24  4:12 PM  Result Value Ref Range  T4, Total 8.7 4.5 - 12.0 ug/dL      Assessment & Plan:   Problem List Items Addressed This Visit       Endocrine   Hypothyroidism - Primary   Chronic.  Controlled.  Continue with current medication regimen of Levothyroxine  75mcg.  Labs ordered.  Return to clinic in 6 months for reevaluation and physical.  Call sooner if concerns arise.       Relevant Orders   T4, free   TSH     Follow up plan: Return in about 6 months (around 04/29/2025) for Physical and Fasting labs.

## 2024-10-31 ENCOUNTER — Other Ambulatory Visit

## 2024-10-31 DIAGNOSIS — E039 Hypothyroidism, unspecified: Secondary | ICD-10-CM

## 2024-11-01 ENCOUNTER — Ambulatory Visit: Payer: Self-pay | Admitting: Nurse Practitioner

## 2024-11-01 LAB — T4, FREE: Free T4: 1.1 ng/dL (ref 0.82–1.77)

## 2024-11-01 LAB — TSH: TSH: 3.64 u[IU]/mL (ref 0.450–4.500)

## 2024-11-30 DIAGNOSIS — H353121 Nonexudative age-related macular degeneration, left eye, early dry stage: Secondary | ICD-10-CM | POA: Diagnosis not present

## 2024-11-30 DIAGNOSIS — D3132 Benign neoplasm of left choroid: Secondary | ICD-10-CM | POA: Diagnosis not present

## 2024-11-30 DIAGNOSIS — H524 Presbyopia: Secondary | ICD-10-CM | POA: Diagnosis not present

## 2025-05-01 ENCOUNTER — Encounter: Admitting: Nurse Practitioner
